# Patient Record
Sex: Female | Born: 1975 | Race: Black or African American | Hispanic: No | Marital: Married | Smoking: Former smoker
Health system: Southern US, Community
[De-identification: ages and names within clinical notes are randomized; demographics above are authoritative.]

## PROBLEM LIST (undated history)

## (undated) DIAGNOSIS — R112 Nausea with vomiting, unspecified: Secondary | ICD-10-CM

## (undated) DIAGNOSIS — Z8719 Personal history of other diseases of the digestive system: Secondary | ICD-10-CM

## (undated) DIAGNOSIS — R0600 Dyspnea, unspecified: Secondary | ICD-10-CM

## (undated) DIAGNOSIS — E119 Type 2 diabetes mellitus without complications: Secondary | ICD-10-CM

## (undated) DIAGNOSIS — N189 Chronic kidney disease, unspecified: Secondary | ICD-10-CM

## (undated) DIAGNOSIS — Z9889 Other specified postprocedural states: Secondary | ICD-10-CM

## (undated) DIAGNOSIS — T8859XA Other complications of anesthesia, initial encounter: Secondary | ICD-10-CM

## (undated) DIAGNOSIS — K219 Gastro-esophageal reflux disease without esophagitis: Secondary | ICD-10-CM

## (undated) DIAGNOSIS — D649 Anemia, unspecified: Secondary | ICD-10-CM

## (undated) DIAGNOSIS — F419 Anxiety disorder, unspecified: Secondary | ICD-10-CM

## (undated) DIAGNOSIS — I499 Cardiac arrhythmia, unspecified: Secondary | ICD-10-CM

## (undated) DIAGNOSIS — I10 Essential (primary) hypertension: Secondary | ICD-10-CM

## (undated) HISTORY — PX: NO PAST SURGERIES: SHX2092

---

## 2003-04-15 ENCOUNTER — Emergency Department (HOSPITAL_COMMUNITY): Admission: EM | Admit: 2003-04-15 | Discharge: 2003-04-15 | Payer: Self-pay | Admitting: *Deleted

## 2016-01-08 ENCOUNTER — Encounter (HOSPITAL_COMMUNITY): Payer: Self-pay

## 2016-01-08 ENCOUNTER — Ambulatory Visit (HOSPITAL_COMMUNITY): Admission: RE | Admit: 2016-01-08 | Payer: Medicaid Other | Source: Ambulatory Visit

## 2016-01-15 ENCOUNTER — Ambulatory Visit (HOSPITAL_COMMUNITY)
Admission: RE | Admit: 2016-01-15 | Discharge: 2016-01-15 | Disposition: A | Discharge status: Home / Self Care | Payer: Medicaid Other | Source: Ambulatory Visit | Attending: Unknown Physician Specialty | Admitting: Unknown Physician Specialty

## 2016-01-15 ENCOUNTER — Encounter: Payer: Medicaid Other | Attending: Obstetrics and Gynecology | Admitting: *Deleted

## 2016-01-15 DIAGNOSIS — Z029 Encounter for administrative examinations, unspecified: Secondary | ICD-10-CM | POA: Diagnosis not present

## 2016-01-15 DIAGNOSIS — O24111 Pre-existing diabetes mellitus, type 2, in pregnancy, first trimester: Secondary | ICD-10-CM

## 2016-01-15 DIAGNOSIS — O09511 Supervision of elderly primigravida, first trimester: Secondary | ICD-10-CM

## 2016-01-15 NOTE — Progress Notes (Signed)
  Patient was seen on 01/15/2016 for Type 2 Diabetes with new pregnancy education. Patient states she was diagnosed with DM 2 in 2010. She started on Metformin in 2014 but she states she had a lapse in her insurance coverage and was not able to follow up with her MD for the last couple of years. She found out she was pregnant about a month ago and is now back on her Metformin and is testing her BG 2- 4 times a day as able. She states understanding of the target BG ranges for pregnancy but states her FBG have been running 179-200 mg/dl and 1-2 hour post meal BG from 200-220 mg/dl. Her MD prescribed Levemir and she took her first injection today without difficulty. The following learning objectives were met by the patient during this visit:   States the definition of Type 2 Diabetes with pregnancy  States why dietary management is important in controlling blood glucose  Describes the effects each nutrient has on blood glucose levels  Demonstrates ability to create a balanced meal plan  Demonstrates carbohydrate counting   States when to check blood glucose levels  Demonstrates proper blood glucose monitoring techniques  States the effect of stress and exercise on blood glucose levels  States the importance of limiting caffeine and abstaining from alcohol and smoking  Rationale for taking Levemir at consistent time each day. Patient plans to take it at her earlier bedtime of about 10 PM daily  Patient states she already has a meter and is comfortable with how to use it  Patient instructed to monitor glucose levels: FBS: 60 - <90 1 hour: <140 2 hour: <120  *Patient received handouts:  Nutrition Diabetes and Pregnancy  Carbohydrate Counting List  Insulin injection sites and rotation plan  I explained rotation of injection sites, techniques for checking her BG and action of Levemir.  Patient will be seen for follow-up as needed.

## 2016-01-16 ENCOUNTER — Encounter (HOSPITAL_COMMUNITY): Payer: Self-pay

## 2016-01-16 DIAGNOSIS — O09519 Supervision of elderly primigravida, unspecified trimester: Secondary | ICD-10-CM | POA: Insufficient documentation

## 2016-01-16 NOTE — Progress Notes (Signed)
Genetic Counseling  High-Risk Gestation Note  Appointment Date:  01/15/16 Referred By: Melissa Dick, MD Date of Birth:  1975/11/30   Pregnancy History: G1P0 Estimated Date of Delivery: 07/23/16 Estimated Gestational Age: [redacted]w[redacted]d Attending: Eulis Foster, MD   [name] was seen for genetic counseling because of a maternal age of 40 y.o..   She will be 40 years old at delivery. delivery.   In summary:  Discussed AMA and associated risk for fetal aneuploidy  Discussed options for screening  First screen-patient indicated NT ultrasound was performed at [facility] office  Quad screen-declined at this time given that NIPS was drawn yesterday  NIPS-patient had Panorama drawn in OB office yesterday (01/14/16) and results are currently pending  Ultrasound-available in second trimester  Discussed diagnostic testing options  [facility]-declined  Amniocentesis-declined  Reviewed family history concerns  Patient has personal history of diabetes mellitus and hypertension; diabetic education appointment today  MFM consultation scheduled 01/20/16  Discussed screening options for fetal anatomy including detailed ultrasound and fetal echocardiogram   Discussed carrier screening options  CF-declined  SMA-declined  Hemoglobinopathies-declined  She was counseled regarding maternal age and the association with risk for chromosome conditions due to nondisjunction with aging of the ova.  We reviewed chromosomes, nondisjunction, and the associated 1 in 1 risk for fetal aneuploidy related to a maternal age of 40 y.o. at [redacted]w[redacted]d at [redacted]w[redacted]d gestation.  She was counseled that the risk for aneuploidy decreases as gestational age increases, accounting for those pregnancies which spontaneously abort.  We specifically discussed Down syndrome (trisomy 31), trisomies 28 and 55, and sex chromosome aneuploidies (47,XXX and 47,XXY) including the common features and prognoses of each.   We reviewed available screening options  including First Screen, Quad screen, noninvasive prenatal screening (NIPS)/cell free DNA (cfDNA) screening, and detailed ultrasound.  She was counseled that screening tests are used to modify a patient's a priori risk for aneuploidy, typically based on age. This estimate provides a pregnancy specific risk assessment. We reviewed the benefits and limitations of each option. Specifically, we discussed the conditions for which each test screens, the detection rates, and false positive rates of each. She was also counseled regarding diagnostic testing via [facility]S and amniocentesis. We reviewed the approximate 1 in 300-500 risk for complications from amniocentesis, including spontaneous pregnancy loss. We discussed the possible results that the tests might provide including: positive, negative, unanticipated, and no result. Finally, they were counseled regarding the cost of each option and potential out of pocket expenses. [name] had NIPS (Panorama) drawn yesterday (01/14/16) at her OB office; results currently pending. She declined diagnostic, invasive testing. She also indicated that if NIPS happens to have no result, she is unsure if she would pursue second blood draw.   We discussed that a detailed ultrasound would be available at ~18+ weeks gestation.  She understands that screening tests cannot rule out all birth defects or genetic syndromes. The patient was advised of this limitation and states she still does not want additional testing at this time.    Melissa [name] was provided with written information regarding cystic fibrosis (CF), spinal muscular atrophy (SMA) and hemoglobinopathies including the carrier frequency, availability of carrier screening and prenatal diagnosis if indicated.  In addition, we discussed that CF and hemoglobinopathies are routinely screened for as part of the Melissa [name] newborn screening panel.  After further discussion, she declined screening for CF, SMA and hemoglobinopathies.   Both  family histories were reviewed and found to be contributory for epilepsy for  the father of the pregnancy and for one of his sons with a partner other than the patient. The underlying etiology is not known. Epilepsy occurs in approximately 1% of the population and can have many causes.  Approximately 80% of epilepsy is thought to be idiopathic while the remaining 20% is secondary to a variety of factors such as perinatal events, infections, trauma and genetic disease.  A specific diagnosis in an affected individual is necessary to accurately assess the risk for other family members to develop epilepsy.  In the absence of a known etiology, epilepsy is thought to be caused by a combination of genetic and environmental factors, called multifactorial inheritance. Recurrence risk for epilepsy is estimated to be 4% for offspring of an individual with primary idiopathic epilepsy. She understands that prenatal screening and diagnosis is not available in the case of an unknown etiology for the epilepsy in the family. It would be important for the couple's pediatrician to be aware of this history so that their child(ren) can be screened and followed appropriately.   Melissa [name] does not have much information regarding the father of the pregnancy's family history. Without further information regarding the provided family history, an accurate genetic risk cannot be calculated. Further genetic counseling is warranted if more information is obtained.  Melissa [name] denied exposure to environmental toxins or chemical agents. She denied the use of alcohol, tobacco or street drugs. She denied significant viral illnesses during the course of her pregnancy. Her medical and surgical histories were contributory for diabetes mellitus and hypertension. She is currently treated with medication for these conditions. We discussed the fact that women who have insulin dependent diabetes are at an increased risk to have a baby with a birth defect.   The increase in risk correlates with the level of blood sugar control during the pregnancy, particularly during organogenesis.  The increase in risk is for any type of birth defect but is greatest for heart, limb, and neural tube defects.  The risk could be as high as 6-10% for individuals whose blood sugars are not well-controlled, but lower for women who have good blood sugar control throughout pregnancy. Second trimester detailed ultrasound and fetal echocardiogram are available to assess fetal anatomy in detail. The patient understands ultrasound cannot diagnose or rule out all birth defects or genetic conditions. MFM consultation is scheduled for 01/20/16.   I counseled Melissa [name] regarding the above risks and available options.  The approximate face-to-face time with the genetic counselor was 45 minutes.  Quinn PlowmanKaren Mccartney Chuba, MS,  Certified Genetic Counselor 01/16/2016

## 2016-01-20 ENCOUNTER — Encounter (HOSPITAL_COMMUNITY): Payer: Self-pay

## 2016-01-20 ENCOUNTER — Ambulatory Visit (HOSPITAL_COMMUNITY)
Admission: RE | Admit: 2016-01-20 | Discharge: 2016-01-20 | Disposition: A | Discharge status: Home / Self Care | Payer: Medicaid Other | Source: Ambulatory Visit | Attending: Unknown Physician Specialty | Admitting: Unknown Physician Specialty

## 2016-01-20 VITALS — BP 151/92 | HR 94 | Wt 294.0 lb

## 2016-01-20 DIAGNOSIS — O09519 Supervision of elderly primigravida, unspecified trimester: Secondary | ICD-10-CM | POA: Insufficient documentation

## 2016-01-20 DIAGNOSIS — Z87891 Personal history of nicotine dependence: Secondary | ICD-10-CM | POA: Insufficient documentation

## 2016-01-20 DIAGNOSIS — Z3A13 13 weeks gestation of pregnancy: Secondary | ICD-10-CM | POA: Insufficient documentation

## 2016-01-20 DIAGNOSIS — E119 Type 2 diabetes mellitus without complications: Secondary | ICD-10-CM | POA: Diagnosis not present

## 2016-01-20 DIAGNOSIS — O09512 Supervision of elderly primigravida, second trimester: Secondary | ICD-10-CM

## 2016-01-20 DIAGNOSIS — O09511 Supervision of elderly primigravida, first trimester: Secondary | ICD-10-CM | POA: Insufficient documentation

## 2016-01-20 DIAGNOSIS — O10011 Pre-existing essential hypertension complicating pregnancy, first trimester: Secondary | ICD-10-CM | POA: Insufficient documentation

## 2016-01-20 DIAGNOSIS — O24111 Pre-existing diabetes mellitus, type 2, in pregnancy, first trimester: Secondary | ICD-10-CM | POA: Insufficient documentation

## 2016-01-20 DIAGNOSIS — O24919 Unspecified diabetes mellitus in pregnancy, unspecified trimester: Secondary | ICD-10-CM | POA: Insufficient documentation

## 2016-01-20 DIAGNOSIS — O24912 Unspecified diabetes mellitus in pregnancy, second trimester: Secondary | ICD-10-CM

## 2016-01-20 HISTORY — DX: Type 2 diabetes mellitus without complications: E11.9

## 2016-01-20 HISTORY — DX: Essential (primary) hypertension: I10

## 2016-01-20 NOTE — Progress Notes (Signed)
MFM Consult Note  40 year old, G1, at 13 weeks 4 days gestation with an EDD of 07/23/2016 seen today in consultation for management options for diabetes.  Past Medical History:  Diagnosis Date  . Diabetes mellitus without complication ([place])   . Hypertension    Past Surgical History:  Procedure Laterality Date  . NO PAST SURGERIES     OB History    Gravida Para Term Preterm AB Living   1             SAB TAB Ectopic Multiple Live Births                 History reviewed. No pertinent family history.  Social History   Social History  . Marital status: Married    Spouse name: N/A  . Number of children: N/A  . Years of education: N/A   Occupational History  . Not on file.   Social History Main Topics  . Smoking status: Former Smoker    Types: Cigarettes  . Smokeless tobacco: Former Systems developer  . Alcohol use No  . Drug use: No  . Sexual activity: No   Other Topics Concern  . Not on file   Social History Narrative  . No narrative on file   No Known Allergies    Impression / Recommendations #1 Pregestational diabetes - She has met with the diabetic educator and is working and struggling to follow the diet. She was suggesting today cutting out her carbs entirely to get her blood glucoses to come into better control. I reinforced that she should follow her meal plan completely and that the way to control her blood sugars would be through the use of insulin.  - She was first diagnosed with diabetes in 2010 and was not taking medications as had no insurance. She had a HgbA1c of 10.6 on the 19th of June 2017 and a TSH of 1.31. She has not had an ophthalmologic examination recently and should be scheduled for this. Given the high HgbA1c and significant proteinuria, would consider also getting and EKG.  - She just started taking 30 units of Levemer in the morning and then switched to the evening. She is also on Metformin 145m orally BID. Her fasting since starting the insulin have  fasting in the 160-189 range, 2 hr B 178-203, 2 hr L 161-247 and 2 hr D 186-214. She is not checking all the appropriate times.  - Fetal anatomic survey should be evaluated at ~18-[redacted] weeks gestation given her elevated HgbA1c and increased risk of birth defects - Pediatric echo should be evaluated at ~24-[redacted] weeks gestation or sooner if clinically indicated - Serial ultrasounds for evaluation of fetal growth starting ~[redacted] weeks gestation - Daily maternal assessment of fetal movement starting ~[redacted] weeks gestation with immediate provider contact for decrease or cessation of fetal movement - Antenatal assessment starting ~32-34 weeks based on glucose, blood pressure control. Sooner as clinically indicated.  - Given chronic hypertension, would consider delivery at ~[redacted] weeks gestation or sooner as clinically indicated. #2 Chronic hypertension with significant proteinuria - She had 3.3 gms of protein on her 24-hour urine.  - She is currently on medication for therapy and should adjust medications to keep her blood pressures <160/105 - We discussed the RBA of baby ASA therapy and she will start 81 mg orally of aspirin daily through the remainder of the pregnancy #3 Advanced maternal age - She has seen our gDietitian See prior note and declined invasive testing.  Her NIPS evaluation is pending at this point.   Questions appear answered to her satisfaction. Precautions for the above given. Spent greater than 1/2 of a 50 minute visit face to face counseling.

## 2016-01-23 ENCOUNTER — Other Ambulatory Visit (HOSPITAL_COMMUNITY): Payer: Self-pay

## 2016-01-23 ENCOUNTER — Encounter (HOSPITAL_COMMUNITY): Payer: Medicaid Other

## 2016-02-09 ENCOUNTER — Other Ambulatory Visit (HOSPITAL_COMMUNITY): Payer: Self-pay

## 2016-02-24 ENCOUNTER — Other Ambulatory Visit (HOSPITAL_COMMUNITY): Payer: Self-pay | Admitting: Unknown Physician Specialty

## 2016-02-24 DIAGNOSIS — Z3A19 19 weeks gestation of pregnancy: Secondary | ICD-10-CM

## 2016-02-24 DIAGNOSIS — Z3689 Encounter for other specified antenatal screening: Secondary | ICD-10-CM

## 2016-02-24 DIAGNOSIS — O24912 Unspecified diabetes mellitus in pregnancy, second trimester: Secondary | ICD-10-CM

## 2016-02-27 ENCOUNTER — Ambulatory Visit (HOSPITAL_COMMUNITY): Payer: Medicaid Other

## 2016-03-02 ENCOUNTER — Encounter (HOSPITAL_COMMUNITY): Payer: Self-pay

## 2016-03-02 ENCOUNTER — Ambulatory Visit (HOSPITAL_COMMUNITY)
Admission: RE | Admit: 2016-03-02 | Discharge: 2016-03-02 | Disposition: A | Discharge status: Home / Self Care | Payer: Medicaid Other | Source: Ambulatory Visit | Attending: Unknown Physician Specialty | Admitting: Unknown Physician Specialty

## 2016-03-02 DIAGNOSIS — Z3689 Encounter for other specified antenatal screening: Secondary | ICD-10-CM

## 2016-03-02 DIAGNOSIS — Z36 Encounter for antenatal screening of mother: Secondary | ICD-10-CM | POA: Insufficient documentation

## 2016-03-02 DIAGNOSIS — O24912 Unspecified diabetes mellitus in pregnancy, second trimester: Secondary | ICD-10-CM | POA: Insufficient documentation

## 2016-03-02 DIAGNOSIS — Z3A19 19 weeks gestation of pregnancy: Secondary | ICD-10-CM | POA: Diagnosis not present

## 2016-03-03 ENCOUNTER — Encounter (HOSPITAL_COMMUNITY): Payer: Self-pay

## 2016-03-04 ENCOUNTER — Other Ambulatory Visit (HOSPITAL_COMMUNITY): Payer: Self-pay

## 2016-03-30 ENCOUNTER — Ambulatory Visit (HOSPITAL_COMMUNITY)
Admission: RE | Admit: 2016-03-30 | Discharge: 2016-03-30 | Disposition: A | Discharge status: Home / Self Care | Payer: Medicaid Other | Source: Ambulatory Visit | Attending: Unknown Physician Specialty | Admitting: Unknown Physician Specialty

## 2016-03-30 ENCOUNTER — Encounter (HOSPITAL_COMMUNITY): Payer: Self-pay

## 2016-03-30 VITALS — BP 154/87 | HR 89 | Wt 312.2 lb

## 2016-03-30 DIAGNOSIS — Z7982 Long term (current) use of aspirin: Secondary | ICD-10-CM | POA: Insufficient documentation

## 2016-03-30 DIAGNOSIS — O10012 Pre-existing essential hypertension complicating pregnancy, second trimester: Secondary | ICD-10-CM | POA: Diagnosis not present

## 2016-03-30 DIAGNOSIS — Z794 Long term (current) use of insulin: Secondary | ICD-10-CM | POA: Insufficient documentation

## 2016-03-30 DIAGNOSIS — Z3A23 23 weeks gestation of pregnancy: Secondary | ICD-10-CM | POA: Diagnosis not present

## 2016-03-30 DIAGNOSIS — R7301 Impaired fasting glucose: Secondary | ICD-10-CM

## 2016-03-30 DIAGNOSIS — O09512 Supervision of elderly primigravida, second trimester: Secondary | ICD-10-CM | POA: Insufficient documentation

## 2016-03-30 DIAGNOSIS — E1165 Type 2 diabetes mellitus with hyperglycemia: Secondary | ICD-10-CM | POA: Diagnosis not present

## 2016-03-30 DIAGNOSIS — O10919 Unspecified pre-existing hypertension complicating pregnancy, unspecified trimester: Secondary | ICD-10-CM

## 2016-03-30 DIAGNOSIS — O24112 Pre-existing diabetes mellitus, type 2, in pregnancy, second trimester: Secondary | ICD-10-CM | POA: Insufficient documentation

## 2016-03-30 DIAGNOSIS — O24312 Unspecified pre-existing diabetes mellitus in pregnancy, second trimester: Secondary | ICD-10-CM

## 2016-03-30 HISTORY — DX: Personal history of other diseases of the digestive system: Z87.19

## 2016-03-30 NOTE — Progress Notes (Signed)
Melissa [name] is a 40 y.o. female patient.  1. [redacted] weeks gestation of pregnancy   2. Preexisting diabetes complicating pregnancy in second trimester, antepartum   3. Advanced maternal age, 1st pregnancy, second trimester   4. Chronic hypertension affecting pregnancy   5. Fasting hyperglycemia    She has recently had her Levemir increased to 52 units at bedtime. She remains on Novolog at mealtimes, 06/07/11 units. Her blood sugars continue to show fasting hyperglycemia, but much improved over previous values. Her 2 hour postprandials are not perfect, but much improved and very close to euglycemia, especially at breakfast and lunch. She has recently been started on Labetalol 100mg  BID in addition to her Nifedipine 60mg  XL daily  Past Medical History:  Diagnosis Date  . Diabetes mellitus without complication ([facility])   . History of hiatal hernia   . Hypertension     Current Outpatient Prescriptions  Medication Sig Dispense Refill  . aspirin EC 81 MG tablet Take 81 mg by mouth daily.    . insulin aspart (NOVOLOG) 100 UNIT/ML injection Inject into the skin 3 (three) times daily before meals.    . insulin detemir (LEVEMIR) 100 UNIT/ML injection Inject 48 Units into the skin daily.     Marland Kitchen LABETALOL HCL PO Take by mouth.    . metFORMIN (GLUCOPHAGE) 1000 MG tablet Take 1,000 mg by mouth 2 (two) times daily with a meal.    . NIFEdipine (PROCARDIA XL/ADALAT-CC) 60 MG 24 hr tablet Take 60 mg by mouth daily.    . Prenatal Vit-Fe Fumarate-FA (PRENATAL VITAMIN PO) Take by mouth.     No current facility-administered medications for this encounter.    No Known Allergies Active Problems:   * No active hospital problems. *  Blood pressure (!) 154/87, pulse 89, weight (!) 312 lb 3.2 oz (141.6 kg), last menstrual period 10/17/2015.  ROS: The patient had a limited review of systems of the neurologic, abdominal and genitourinary system. Pertinent negatives include no headache, nausea, vomiting, RUQ pain,  epigastric pain, pelvic pressure, vaginal bleeding, loss of fluid per vagina, changes in vaginal discharge or contractions.   Physical Exam: A limited physical exam was performed. See above for vital signs. She is a well nourished obese female in no apparent distress. HEENT is normocephalic and atraumatic. Abdomen is gravid. Extremities are without edema.  Impression: IUP at 23+4 weeks, preexisting diabetes in improving control with some fasting hyperglycemia, chronic hypertension, and advanced maternal age.  Recommendations: Diabetes: I would recommend increasing evening meal Novolog to 14 units, and bedtime Levemir to 54 units. She recently had a 24 hour urine done, but I am unable to access the result. Her baseline proteinura was high, >3g in the first trimester. If it has not been done recently, I would recommend a spot-check of the patient's creatinine just to reassure ourselves that she is not having any deterioration of renal function.  Hypertension: the patient is very concerned about her risk for superimposed preeclampsia. She has noted that you have warned her that her risk for superimposed preeclampsia is very high. I went over with her the pathophysiology of PIH and the risk factors that apply in her case. Unfortunately you are correct that she is likely to devlop it. I encouraged her to continue her ASA and I went over theprecautions that we would take to optimize outcomes in case of the need for a preterm delivery.  Obstetrical regimen: At your discretion, I would like to see her back for a repeat  evaluation here in MFM in the early third trimester, 6-8 weeks from now. She should continue US evaluation in your office as you are already doing. Given her clinical situation, I might give consideration to starting antepartum testing a bit earlier than usual. I would consider weekly BPP from 28-32 weeks and the either weekly BPP or twice-weekly NST from that point on. Timing of delivery will  depend on her clinical course. Given her need for multiple antihypertensive and hypoglycemic agents I would consider delivery prior to 39 weeks. We can give a more definite recommendation during the early third trimester.  Thank you for allowing me to participate in your patient's care  Greater than 50% of my face-to-face time in this 40 minute visit was spent in counseling the patient  Allegra LaiMark Glidwell Hue [name] 03/30/2016

## 2016-04-01 ENCOUNTER — Other Ambulatory Visit (HOSPITAL_COMMUNITY): Payer: Self-pay

## 2016-04-23 ENCOUNTER — Encounter (HOSPITAL_COMMUNITY): Payer: Self-pay

## 2016-04-23 ENCOUNTER — Other Ambulatory Visit (HOSPITAL_COMMUNITY): Payer: Self-pay

## 2016-04-23 ENCOUNTER — Ambulatory Visit (HOSPITAL_COMMUNITY)
Admission: RE | Admit: 2016-04-23 | Discharge: 2016-04-23 | Disposition: A | Discharge status: Home / Self Care | Payer: Medicaid Other | Source: Ambulatory Visit | Attending: Unknown Physician Specialty | Admitting: Unknown Physician Specialty

## 2016-04-23 VITALS — BP 139/86 | HR 86 | Wt 313.5 lb

## 2016-04-23 DIAGNOSIS — E119 Type 2 diabetes mellitus without complications: Secondary | ICD-10-CM | POA: Diagnosis not present

## 2016-04-23 DIAGNOSIS — O24112 Pre-existing diabetes mellitus, type 2, in pregnancy, second trimester: Secondary | ICD-10-CM | POA: Insufficient documentation

## 2016-04-23 DIAGNOSIS — O99212 Obesity complicating pregnancy, second trimester: Secondary | ICD-10-CM | POA: Diagnosis not present

## 2016-04-23 DIAGNOSIS — Z3A27 27 weeks gestation of pregnancy: Secondary | ICD-10-CM | POA: Diagnosis not present

## 2016-04-23 DIAGNOSIS — O10012 Pre-existing essential hypertension complicating pregnancy, second trimester: Secondary | ICD-10-CM | POA: Diagnosis not present

## 2016-04-23 DIAGNOSIS — E669 Obesity, unspecified: Secondary | ICD-10-CM | POA: Diagnosis not present

## 2016-04-23 NOTE — Progress Notes (Signed)
MFM Consult, Staff Note:  First, we discussed the maternal risks of pregnancy with underlying diabetes. With regard to the impact of pregnancy on maternal disease, I told her that patients with underlying vasculopathy, especially of the retina and kidneys, appear to fare much worse during pregnancy than women without underlying vascular disease. I told her that obstetric complications such as pre-eclampsia, sometimes severe and early-onset, are seen with increased frequency in women with insulin-dependent diabetes.  We discussed the second- and third-trimester risk of abnormal fetal growth, including both growth restriction related to placental insufficiency and macrosomia due to maternal-fetal hyperglycemia. We also recognized oligohydramnios, stillbirth, fetal distress, meconium, etc., as potential complications. I also went over the newborn metabolic derangements that can occur with maternal diabetes, including delayed pulmonary maturity and hypoglycemia.  We talked about the tightness of glycemic control that is recommended before and during pregnancy. I reviewed the usual goals of achieving a HgbA1c value 6% or below.  While she began pregnancy with HgbA1c >10%, her last value was 5.8% indicating that she has excellent control.  I detailed the blood sugar targets of having fasting values in the 70-95 mg/dl range, and postprandial values under 120 mg/dl at two hours. I emphasized the time and effort required to achieve tight control in pregnancy, including the meticulous fingerstick monitoring; close adherence to diet, medication, and an exercise program; and, compliance with obstetric visits and scheduled fetal testing.   Review of her glycemic measures since the recent increase in levimir to 60 u qhs indicates a trend toward euglycemia.  Her fasting today was 87.  Given that she was just changed 2 days ago, I did not make any changes.  However, I told her that if over the next week if >50% of her  fasting values were above 95mg /dL she could increase to levimir 64 u qhs.  Given that her postprandial sugars are all within target this week on Novolog 06/07/11, I did not make any changes.   I also reviewed hypertension as a cause of uteroplacental insufficiency, with increased risk of IUGR, oligohydramnios, and stillbirth. I told her that her hypertension also places her at increased risk for preeclampsia, describing the triad of increased blood pressure, proteinuria, and abnormal edema. Lastly, hypertension (severe range) increases the risk of placental abruption, especially in the setting of superimposed preeclampsia.  I reviewed the essential tenets in the most recent guidelines for management of hypertension in pregnancy in accordance with the American College of Obstetrics and Gynecology expert opinion. We talked about the medical treatment of hypertension in pregnancy. I outlined the different classes of medications, emphasizing that angiotensin enzyme inhibitors and angoitensin receptor blockers are contraindicated, and diuretics are relatively contraindicated. I told her that beta-blockers and calcium channel blockers are commonly used to treat hypertension in pregnancy, and that both are felt to be safe for use in pregnancy.   Her dose of labetalol 200mg  po bid is adequate in current maintainance of her blood pressure in pregnancy as evidenced by BP's of 139/86 today.   I outlined the usual plan of management for hypertension in pregnancy. She should have her blood pressure carefully followed, and her medications adjusted to keep her BP in the target range of around 130-159/70-109 mm/Hg; ie, HTN should not be treated (no medication adjustment) until 160/110 or greater measurements for blood pressures to be consistent with current ACOG/SMFM guidelines (ie, guidelines are 160/110 in absence of renal/cardiac disease during pregnancy).   I gave the following recommendations to your patient. She  should keep up the good work toward tighter control as she is now doing very well, aiming for the glycemic targets mentioned above. She should continue monthly ultrasounds for fetal growth (scheduled for her). She will also need a screening fetal echocardiogram, twice weekly non-stress tests beginning [redacted] weeks along with weekly AFI. I recommend HgbA1c surveillance every 4-6 weeks of gestation. Finally, assuming all goes well (ie, good glycemic control, AGA growth and reassuring testing), she should expect to be delivered by or shortly after 39 weeks, if spontaneous labor does not occur beforehand.   Impression: 1. SIUP at 3739w0d 2. Type II DM 3. CHTN 4. Obesity  Summary of Recommendations: 1.continue Levimir 60u qhs 2. Continue Novolog 06/07/11 3. q 2 week glycemic review with diabetic education/MD 4. Monthly interval growth (scheduled here, noting we can review glycemic log at that time and this has been scheduled accordingly)  5. Twice weekly NST and weekly AFI beginning 32 weeks. 6. Fetal echocardiogram (scheduled) 7. HgbA1c q4-6 weeks 8. q trimester urine culture to screen for asymptomatic bacteruria  9. 11-20 lbs weight gain over pregnancy (IOM guideline for obese women) 10. Continue labetalol 200mg  po bid.   11. Preeclamptic precautions. 12. Delivery at 38-39 weeks with excellent glycemic control and control of BP and reassuring maternal-fetal status; sooner if clinically indicated  13. Given obesity, I recommend anesthesia consultation with admission for delivery. 14. Given obesity, I recommend LMWH prophylaxis postoperatively while in the hospital until fully ambulatory and ready for discharge home if she should end up needing a cesarean delivery for any unforeseen obstetrical indications (noting she is at increased risk for labor dystocia and hence has an increased relative risk for ultimately meeting criteria for needing a cesarean delivery).  Time Spent: I spent in excess of 60  minutes in consultation with this patient to review records, evaluate her case, and provide her with an adequate discussion and education. More than 50% of this time was spent in direct face-to-face counseling.  It was a pleasure seeing your patient in the office today. Thank you for consultation. Please do not hesitate to contact our service for any further questions.   Thank you,  Louann SjogrenJeffrey Morgan Gaynelle Arabianenney   Denney, Louann SjogrenJeffrey Morgan, MD, MS, FACOG Assistant Professor Section of Maternal-Fetal Medicine [facility]

## 2016-04-26 ENCOUNTER — Other Ambulatory Visit (HOSPITAL_COMMUNITY): Payer: Self-pay | Admitting: Obstetrics and Gynecology

## 2016-04-26 DIAGNOSIS — Z3A37 37 weeks gestation of pregnancy: Secondary | ICD-10-CM

## 2016-04-26 DIAGNOSIS — O09523 Supervision of elderly multigravida, third trimester: Secondary | ICD-10-CM

## 2016-04-26 DIAGNOSIS — O10913 Unspecified pre-existing hypertension complicating pregnancy, third trimester: Secondary | ICD-10-CM

## 2016-04-26 DIAGNOSIS — O24313 Unspecified pre-existing diabetes mellitus in pregnancy, third trimester: Secondary | ICD-10-CM

## 2016-04-26 DIAGNOSIS — O10012 Pre-existing essential hypertension complicating pregnancy, second trimester: Secondary | ICD-10-CM

## 2016-04-26 DIAGNOSIS — O99213 Obesity complicating pregnancy, third trimester: Secondary | ICD-10-CM

## 2016-04-26 DIAGNOSIS — Z3A33 33 weeks gestation of pregnancy: Secondary | ICD-10-CM

## 2016-04-26 DIAGNOSIS — O24112 Pre-existing diabetes mellitus, type 2, in pregnancy, second trimester: Secondary | ICD-10-CM

## 2016-04-26 DIAGNOSIS — O09512 Supervision of elderly primigravida, second trimester: Secondary | ICD-10-CM

## 2016-04-26 DIAGNOSIS — O99212 Obesity complicating pregnancy, second trimester: Secondary | ICD-10-CM

## 2016-04-26 DIAGNOSIS — Z3A29 29 weeks gestation of pregnancy: Secondary | ICD-10-CM

## 2016-05-07 ENCOUNTER — Other Ambulatory Visit (HOSPITAL_COMMUNITY): Payer: Self-pay | Admitting: Obstetrics and Gynecology

## 2016-05-07 ENCOUNTER — Ambulatory Visit (HOSPITAL_COMMUNITY)
Admission: RE | Admit: 2016-05-07 | Discharge: 2016-05-07 | Disposition: A | Discharge status: Home / Self Care | Payer: Medicaid Other | Source: Ambulatory Visit | Attending: Unknown Physician Specialty | Admitting: Unknown Physician Specialty

## 2016-05-07 ENCOUNTER — Encounter (HOSPITAL_COMMUNITY): Payer: Self-pay

## 2016-05-07 DIAGNOSIS — O09513 Supervision of elderly primigravida, third trimester: Secondary | ICD-10-CM | POA: Insufficient documentation

## 2016-05-07 DIAGNOSIS — O163 Unspecified maternal hypertension, third trimester: Secondary | ICD-10-CM

## 2016-05-07 DIAGNOSIS — O99212 Obesity complicating pregnancy, second trimester: Secondary | ICD-10-CM

## 2016-05-07 DIAGNOSIS — O24113 Pre-existing diabetes mellitus, type 2, in pregnancy, third trimester: Secondary | ICD-10-CM | POA: Diagnosis not present

## 2016-05-07 DIAGNOSIS — Z3A29 29 weeks gestation of pregnancy: Secondary | ICD-10-CM | POA: Insufficient documentation

## 2016-05-07 DIAGNOSIS — O24913 Unspecified diabetes mellitus in pregnancy, third trimester: Secondary | ICD-10-CM

## 2016-05-07 DIAGNOSIS — O10012 Pre-existing essential hypertension complicating pregnancy, second trimester: Secondary | ICD-10-CM

## 2016-05-07 DIAGNOSIS — O10913 Unspecified pre-existing hypertension complicating pregnancy, third trimester: Secondary | ICD-10-CM

## 2016-05-07 DIAGNOSIS — O99213 Obesity complicating pregnancy, third trimester: Secondary | ICD-10-CM

## 2016-05-07 DIAGNOSIS — O09512 Supervision of elderly primigravida, second trimester: Secondary | ICD-10-CM

## 2016-05-07 DIAGNOSIS — O10013 Pre-existing essential hypertension complicating pregnancy, third trimester: Secondary | ICD-10-CM | POA: Insufficient documentation

## 2016-05-07 DIAGNOSIS — O24112 Pre-existing diabetes mellitus, type 2, in pregnancy, second trimester: Secondary | ICD-10-CM

## 2016-05-07 DIAGNOSIS — O24313 Unspecified pre-existing diabetes mellitus in pregnancy, third trimester: Secondary | ICD-10-CM

## 2016-05-07 NOTE — Progress Notes (Signed)
I saw Ms. Hett again today for a follow up visit. Her US had not been performed at the time of her visit. Her problems are as listed below:  1. [redacted] weeks gestation of pregnancy   2. Preexisting diabetes complicating pregnancy in second trimester, antepartum   3. Advanced maternal age, 1st pregnancy, second trimester   4. Chronic hypertension affecting pregnancy   5. Fasting hyperglycemia    She has recently had her Levemir increased to 62 units at bedtime. She remains on Novolog at mealtimes, 06/07/11 units. Her blood sugars are much improved. Her 2 hour postprandials are euglycemic, averaging only one abnormal value per week. Fastings are a bit less well-controlled, with 2 abnormal values per week, but none >110. She has recently had her Labetalol increased to 200mg  BID in addition to her Nifedipine 60mg  XL daily      Past Medical History:  Diagnosis Date  . Diabetes mellitus without complication (HCC)   . History of hiatal hernia   . Hypertension           Current Outpatient Prescriptions  Medication Sig Dispense Refill  . aspirin EC 81 MG tablet Take 81 mg by mouth daily.    . insulin aspart (NOVOLOG) 100 UNIT/ML injection Inject into the skin 3 (three) times daily before meals.    . insulin detemir (LEVEMIR) 100 UNIT/ML injection Inject 62 Units into the skin daily.     Marland Kitchen. LABETALOL HCL 200 MG PO Take by mouth BID    . metFORMIN (GLUCOPHAGE) 1000 MG tablet Take 1,000 mg by mouth 2 (two) times daily with a meal.    . NIFEdipine (PROCARDIA XL/ADALAT-CC) 60 MG 24 hr tablet Take 60 mg by mouth daily.    . Prenatal Vit-Fe Fumarate-FA (PRENATAL VITAMIN PO) Take by mouth.     No current facility-administered medications for this encounter.    No Known Allergies Active Problems:   * No active hospital problems. *  Blood pressure 169/90, pulse 89, weight  315.4 lb  ROS: The patient had a limited review of systems of the neurologic, abdominal and genitourinary  system. Pertinent negatives include no headache, nausea, vomiting, RUQ pain, epigastric pain, pelvic pressure, vaginal bleeding, loss of fluid per vagina, changes in vaginal discharge or contractions.   Physical Exam: A limited physical exam was performed. See above for vital signs. She is a well nourished obese female in no apparent distress. HEENT is normocephalic and atraumatic. Abdomen is gravid. Extremities are without edema.  Impression: IUP at 29+0 weeks, preexisting diabetes in good control with some fasting hyperglycemia, chronic hypertension, and advanced maternal age.  Recommendations: Diabetes: No changes in her medication regimen. As I mentioned previously,if it has not been done recently, I would recommend a spot-check of the patient's creatinine just to reassure ourselves that she is not having any deterioration of renal function.  Hypertension: No changes as of yet in her medication regimen. If her BP is elevated above the 150/90 level at her visit in your office in 6 days, I would repeat preeclamptic labs and increase her dose of labetalol if there is no overt sign of superimposed preeclampsia  Obstetrical regimen: At your discretion, I would like to see her back for a repeat evaluation here in MFM in 4 weeks. She should continue US evaluation in your office as you are already doing. We will do a BPP with her growth scan today, and I would start her antepartum fetal testing regimen next week.. Timing of delivery will depend  on her clinical course. Given her need for multiple antihypertensive and hypoglycemic agents I would consider delivery between 37 and 39 weeks, depending on the quality of her control. At this point I would plan for delivery at 38 weeks. That of course may change if clinical course deteriorates  Thank you for allowing me to participate in your patient's care  Greater than 50% of my face-to-face time in this 20 minute visit was spent in counseling the  patient  [employer] Glidwell Newman

## 2016-05-10 ENCOUNTER — Other Ambulatory Visit (HOSPITAL_COMMUNITY): Payer: Self-pay

## 2016-06-04 ENCOUNTER — Encounter (HOSPITAL_COMMUNITY): Payer: Self-pay

## 2016-06-04 ENCOUNTER — Ambulatory Visit (HOSPITAL_COMMUNITY)
Admission: RE | Admit: 2016-06-04 | Discharge: 2016-06-04 | Disposition: A | Discharge status: Home / Self Care | Payer: Medicaid Other | Source: Ambulatory Visit | Attending: Unknown Physician Specialty | Admitting: Unknown Physician Specialty

## 2016-06-04 DIAGNOSIS — O09513 Supervision of elderly primigravida, third trimester: Secondary | ICD-10-CM | POA: Insufficient documentation

## 2016-06-04 DIAGNOSIS — O99213 Obesity complicating pregnancy, third trimester: Secondary | ICD-10-CM | POA: Insufficient documentation

## 2016-06-04 DIAGNOSIS — E669 Obesity, unspecified: Secondary | ICD-10-CM | POA: Insufficient documentation

## 2016-06-04 DIAGNOSIS — Z3A31 31 weeks gestation of pregnancy: Secondary | ICD-10-CM | POA: Diagnosis not present

## 2016-06-04 DIAGNOSIS — O24313 Unspecified pre-existing diabetes mellitus in pregnancy, third trimester: Secondary | ICD-10-CM | POA: Diagnosis not present

## 2016-06-04 DIAGNOSIS — Z794 Long term (current) use of insulin: Secondary | ICD-10-CM | POA: Diagnosis not present

## 2016-06-04 DIAGNOSIS — E119 Type 2 diabetes mellitus without complications: Secondary | ICD-10-CM | POA: Insufficient documentation

## 2016-06-04 DIAGNOSIS — I10 Essential (primary) hypertension: Secondary | ICD-10-CM | POA: Insufficient documentation

## 2016-06-04 DIAGNOSIS — O10013 Pre-existing essential hypertension complicating pregnancy, third trimester: Secondary | ICD-10-CM | POA: Diagnosis not present

## 2016-06-04 DIAGNOSIS — Z87891 Personal history of nicotine dependence: Secondary | ICD-10-CM | POA: Diagnosis not present

## 2016-06-04 DIAGNOSIS — Z3A33 33 weeks gestation of pregnancy: Secondary | ICD-10-CM

## 2016-06-04 DIAGNOSIS — Z7982 Long term (current) use of aspirin: Secondary | ICD-10-CM | POA: Insufficient documentation

## 2016-06-04 DIAGNOSIS — O10913 Unspecified pre-existing hypertension complicating pregnancy, third trimester: Secondary | ICD-10-CM

## 2016-06-04 DIAGNOSIS — O09523 Supervision of elderly multigravida, third trimester: Secondary | ICD-10-CM

## 2016-06-04 NOTE — Progress Notes (Signed)
Maternal Fetal Medicine Note  Melissa [name]  Returns for follow up.  Her prenatal course has been complicated by:  1) Type 2 diabetes - initial HbA1C was 10.6; most recent 5.8; patient is currently on Levomir 65 and 06/07/11 of Humalog with meals 2) Obesity 3) Chronic hypertension on Procardia 60 mg XL and Labetalol 200 mg BID 4) Fetus with "ventricular hypertrophy" on fetal ech - recommend follow up echo after delivery   Melissa [name] is without complaints.  The fetus is active.  Levomir recently increased to 65 earlier this week.  Her blood sugars - fasting mostly in high 90's; all other post prandial values within target range.  OB History: OB History    Gravida Para Term Preterm AB Living   1         0   SAB TAB Ectopic Multiple Live Births                  PMH:  Past Medical History:  Diagnosis Date  . Diabetes mellitus without complication (HCC)   . History of hiatal hernia   . Hypertension     PSH:  Past Surgical History:  Procedure Laterality Date  . NO PAST SURGERIES     Meds:  Current Outpatient Prescriptions on File Prior to Encounter  Medication Sig Dispense Refill  . aspirin EC 81 MG tablet Take 81 mg by mouth daily.    . insulin aspart (NOVOLOG) 100 UNIT/ML injection Inject into the skin 3 (three) times daily before meals.    . insulin detemir (LEVEMIR) 100 UNIT/ML injection Inject 48 Units into the skin daily.     . IRON PO Take by mouth.    . LABETALOL HCL PO Take by mouth.    . metFORMIN (GLUCOPHAGE) 1000 MG tablet Take 1,000 mg by mouth 2 (two) times daily with a meal.    . NIFEdipine (PROCARDIA XL/ADALAT-CC) 60 MG 24 hr tablet Take 60 mg by mouth daily.    . Prenatal Vit-Fe Fumarate-FA (PRENATAL VITAMIN PO) Take by mouth.     No current facility-administered medications on file prior to encounter.    Allergies: No Known Allergies   Soc:  Social History   Social History  . Marital status: Married    Spouse name: N/A  . Number of children: N/A  .  Years of education: N/A   Occupational History  . Not on file.   Social History Main Topics  . Smoking status: Former Smoker    Types: Cigarettes  . Smokeless tobacco: Former NeurosurgeonUser  . Alcohol use No  . Drug use: No  . Sexual activity: No   Other Topics Concern  . Not on file   Social History Narrative  . No narrative on file    Review of Systems: no vaginal bleeding or cramping/contractions, no LOF, no nausea/vomiting. All other systems reviewed and are negative.   PE:  Wt: 316 lbs, BP 150/89, P 96  GEN: well-appearing female ABD: gravid, NT  Ultrasound: Single IUP at 33w 0d Type 2 diabetes, Obesity, CHTN Fetal growth is appropriate (32nd %tile) Active fetus Normal amniotic fluid volume  A/P: 1) Type 2 diabetes - appears to now be well-controlled on the patient's current dose of insulin and metformin.  No changes at this time.  [place] and is being [place] twice weekly.  2) CHTN - the patient reports that she just turned in a 24-hr urine protein collection and has not been  given the results.  Her blood pressures today are somewhat improved compared to her prior clinic visits. Would not make any changes to her medications today.  If she is noted to have worsening blood pressures or other symptoms, would have a low threshold to check preeclampsia labs  3) Ventricular hypertrophy on fetal echo - will need Pediatric echo after delivery  Recommendations: 1) [facility] [place] [facility] [place] [facility] 2) Patient is scheduled for follow up ultrasound in 4 weeks with MFM  3) Needs at least weekly visits for blood pressure checks and blood sugar reviews.  Would like to maintain blood pressures at or below 150/90's. 4) Given blood pressures / diabetic control, would recommend delivery between 37-[redacted] weeks gestation; clinical scenario may dictate earlier delivery   Thank you for the opportunity to be a part of the care of Melissa [name]. Please contact our office if we can be of further assistance.   I spent approximately 30 minutes with this patient with over 50% of time spent in face-to-face counseling.  Alpha GulaPaul Shanan Mcmiller, MD Maternal Fetal Medicine

## 2016-06-09 ENCOUNTER — Encounter (HOSPITAL_COMMUNITY): Payer: Self-pay

## 2016-07-02 ENCOUNTER — Ambulatory Visit (HOSPITAL_COMMUNITY)
Admission: RE | Admit: 2016-07-02 | Discharge: 2016-07-02 | Disposition: A | Discharge status: Home / Self Care | Payer: Medicaid Other | Source: Ambulatory Visit | Attending: Unknown Physician Specialty | Admitting: Unknown Physician Specialty

## 2016-07-02 ENCOUNTER — Encounter (HOSPITAL_COMMUNITY): Payer: Self-pay

## 2016-07-02 ENCOUNTER — Other Ambulatory Visit: Payer: Self-pay

## 2016-07-02 ENCOUNTER — Other Ambulatory Visit (HOSPITAL_COMMUNITY): Payer: Self-pay | Admitting: Obstetrics and Gynecology

## 2016-07-02 DIAGNOSIS — O09513 Supervision of elderly primigravida, third trimester: Secondary | ICD-10-CM | POA: Insufficient documentation

## 2016-07-02 DIAGNOSIS — O09523 Supervision of elderly multigravida, third trimester: Secondary | ICD-10-CM

## 2016-07-02 DIAGNOSIS — O10913 Unspecified pre-existing hypertension complicating pregnancy, third trimester: Secondary | ICD-10-CM

## 2016-07-02 DIAGNOSIS — O10013 Pre-existing essential hypertension complicating pregnancy, third trimester: Secondary | ICD-10-CM | POA: Diagnosis not present

## 2016-07-02 DIAGNOSIS — O99213 Obesity complicating pregnancy, third trimester: Secondary | ICD-10-CM

## 2016-07-02 DIAGNOSIS — O24313 Unspecified pre-existing diabetes mellitus in pregnancy, third trimester: Secondary | ICD-10-CM

## 2016-07-02 DIAGNOSIS — Z3A27 27 weeks gestation of pregnancy: Secondary | ICD-10-CM | POA: Diagnosis not present

## 2016-07-02 DIAGNOSIS — Z3A37 37 weeks gestation of pregnancy: Secondary | ICD-10-CM

## 2016-07-02 NOTE — Progress Notes (Signed)
MFM note  Melissa [name]  Returns for follow up.  Her prenatal course has been complicated by:  1) Type 2 diabetes - initial HbA1C was 10.6; most recent 5.8; patient is currently on Levomir 65 and 06/07/11 of Humalog with meals 2) Obesity 3) Chronic hypertension on Procardia 60 mg XL and Labetalol 200 mg BID 4) Fetus with "ventricular hypertrophy" on fetal echo - recommend follow up echo after delivery   Melissa [name] is without complaints.  Fingestick logs were reviewed - majority of values within target range.    Past Medical History:  Diagnosis Date  . Diabetes mellitus without complication (HCC)   . History of hiatal hernia   . Hypertension     Past Surgical History:  Procedure Laterality Date  . NO PAST SURGERIES     Current Outpatient Prescriptions on File Prior to Encounter  Medication Sig Dispense Refill  . aspirin EC 81 MG tablet Take 81 mg by mouth daily.    . insulin aspart (NOVOLOG) 100 UNIT/ML injection Inject into the skin 3 (three) times daily before meals.    . insulin detemir (LEVEMIR) 100 UNIT/ML injection Inject 48 Units into the skin daily.     . IRON PO Take by mouth.    . LABETALOL HCL PO Take by mouth.    . metFORMIN (GLUCOPHAGE) 1000 MG tablet Take 1,000 mg by mouth 2 (two) times daily with a meal.    . NIFEdipine (PROCARDIA XL/ADALAT-CC) 60 MG 24 hr tablet Take 60 mg by mouth daily.    . Prenatal Vit-Fe Fumarate-FA (PRENATAL VITAMIN PO) Take by mouth.     No current facility-administered medications on file prior to encounter.    VS: 322.4 lbs, 140/89, 93  WDWN female in NAD Abd- soft, NT, gravid   Ultrasound: Single IUP at 37w 0d Type 2 diabetes, CHTN, AMA Fetal growth is appropriate (44th %tile) BPP 8/8 Normal amniotic fluid volume  A/P:  1) Type 2 diabetes - appears to now be well-controlled on the patient's current dose of insulin and metformin.  No changes at this time.  Continue 2x weekly NSTS with weekly AFIs.             2) CHTN - the  patient reports that she had some recent preeclampsia labs that were within normal limits.  Her blood pressure today is stable - would not make any adjustments to her medications at this time.  Would like to maintain BPs at or below 150's/90's.             3) Ventricular hypertrophy on fetal echo - will need Pediatric echo after delivery  Recommendations: 1) Continue 2x weekly NSTs and weekly AFIs in [place] 2) Recommend delivery at 38 weeks given CHTN on two agents 3) Notify Pediatric team at time of delivery - will need echo of the newborn after delivery (due to ventricular hypertrophy)  Alpha GulaPaul Belita Warsame, MD Maternal Fetal Medicine

## 2016-11-19 ENCOUNTER — Encounter (HOSPITAL_COMMUNITY): Payer: Self-pay

## 2017-08-17 IMAGING — US US MFM FETAL BPP W/O NON-STRESS
1 series · 13 of 28 positions shown · non-contrast
Comparison: none

[Series 1: us mfm fetal bpp w/o non-stress · 58 acquisitions, 13 frames shown]
[im 3/58]
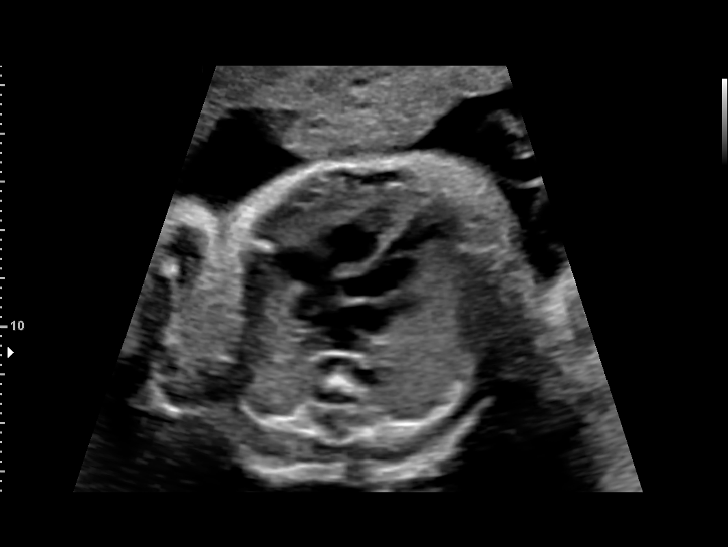
[im 7/58]
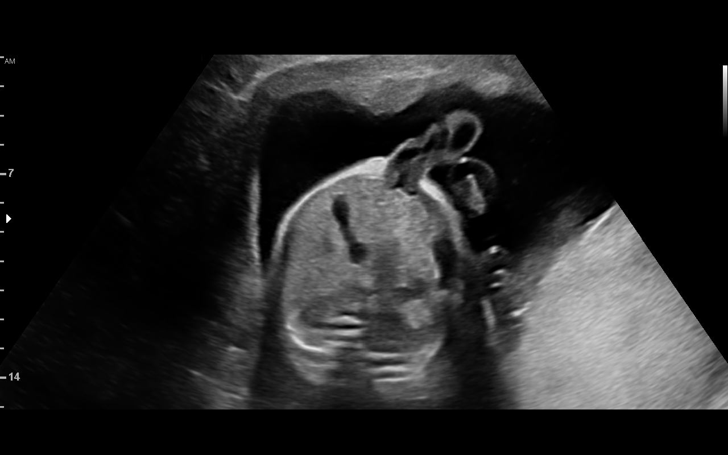
[im 11/58]
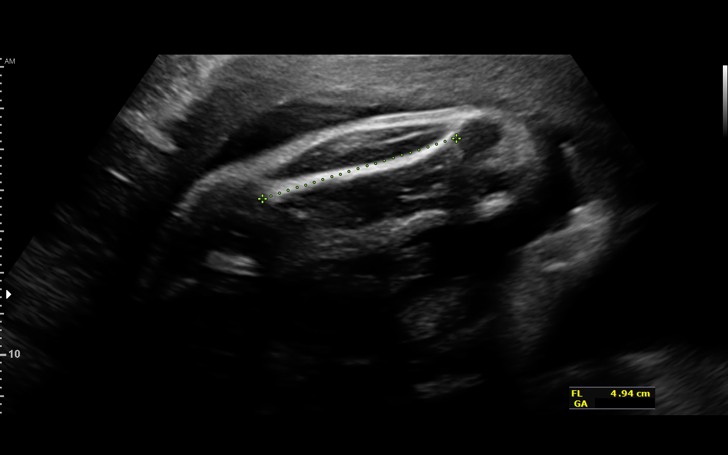
[im 15/58]
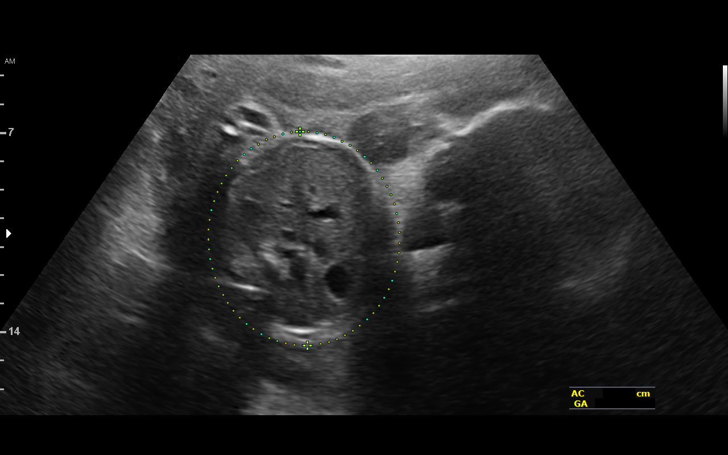
[im 20/58]
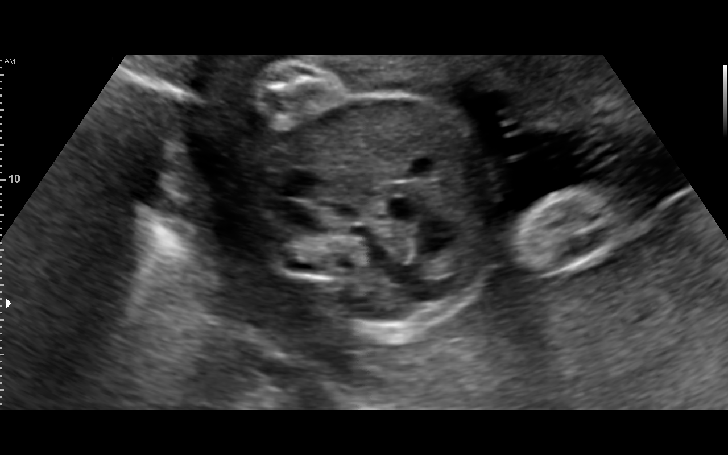
[im 24/58]
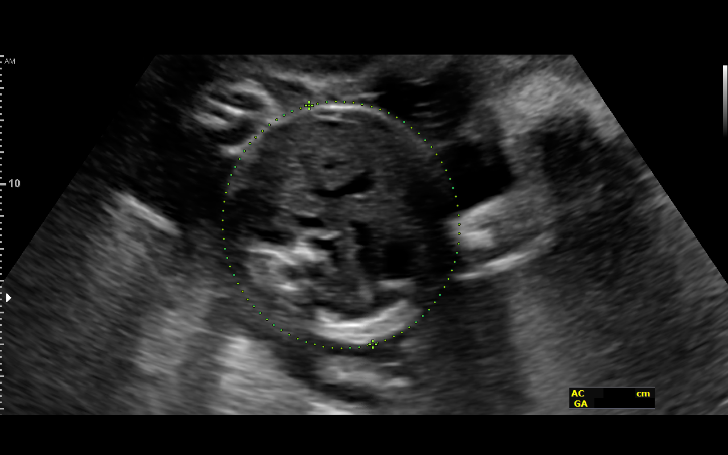
[im 30/58]
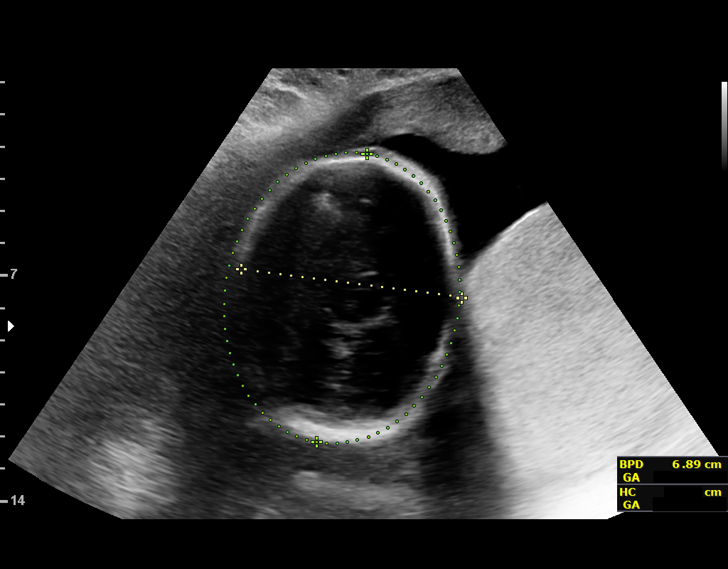
[im 34/58]
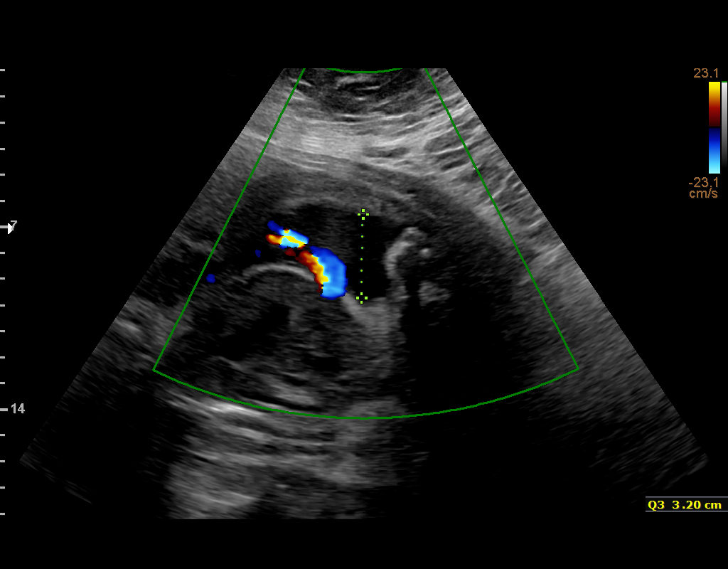
[im 39/58]
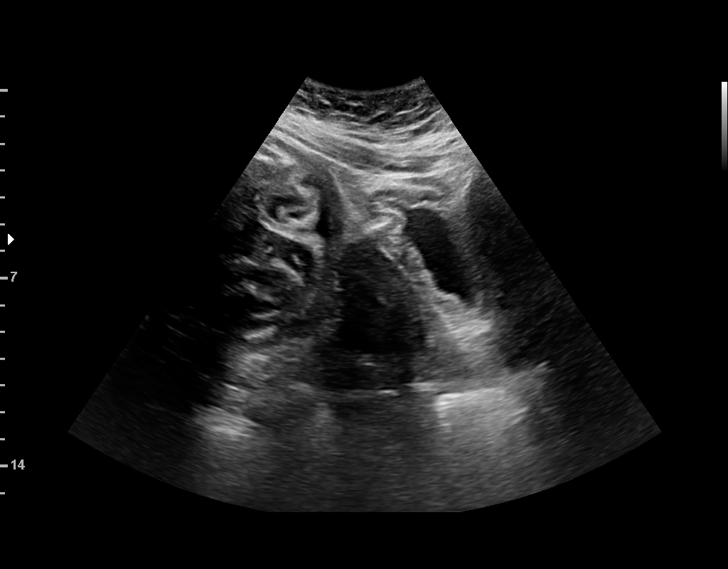
[im 43/58]
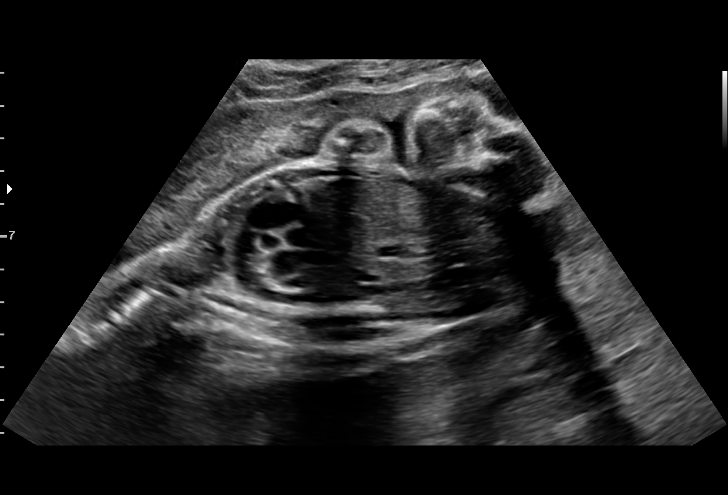
[im 47/58]
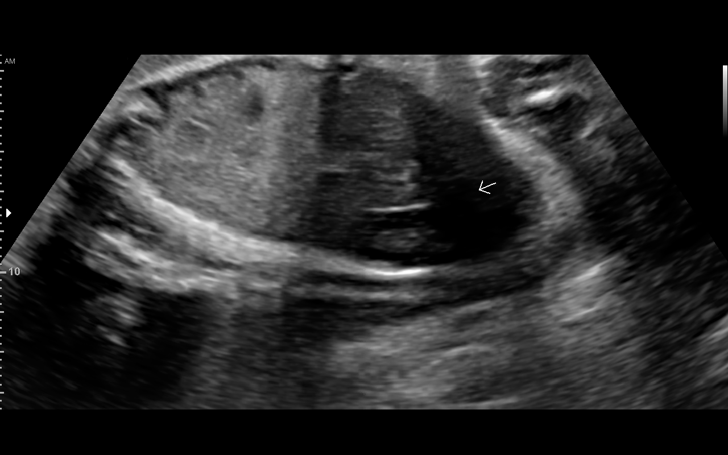
[im 51/58]
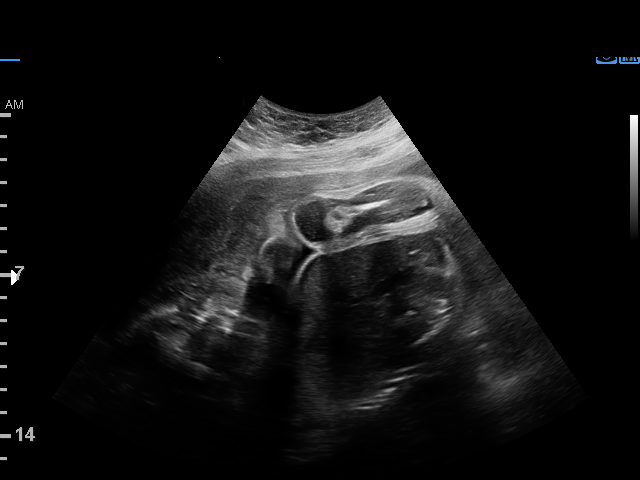
[im 55/58]
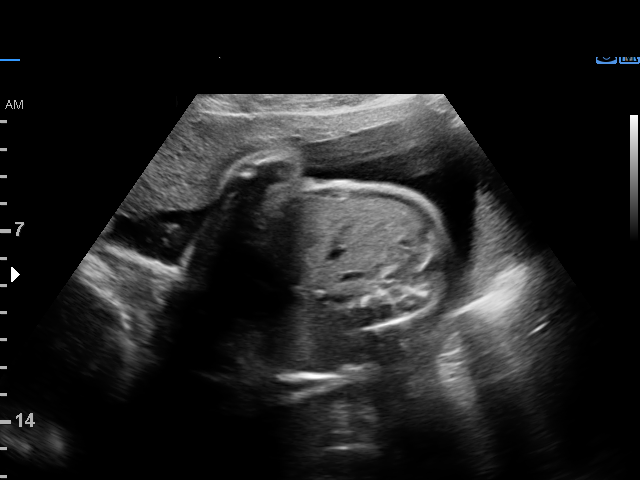

[13 of 28 positions shown; findings below may reference images not displayed]

pm)

Center
[REDACTED] [id number]

1  Kiri Jim              [id number]      [id number]     [id number]
2  RELINDAS AUAD              [id number]      [id number]     [id number]
Indications

29 weeks gestation of pregnancy
Advanced maternal age primigravida 35+,
third trimester
Obesity complicating pregnancy, third
trimester
Pre-existing essential hypertension
complicating pregnancy, third trimester
Pre-existing diabetes, type 2, in pregnancy,
third trimester
OB History

Gravidity:    1         Term:   0        Prem:   0        SAB:   0
TOP:          0       Ectopic:  0
Fetal Evaluation

Num Of Fetuses:     1
Fetal Heart         151
Rate(bpm):
Cardiac Activity:   Observed
Presentation:       Breech
Placenta:           Posterior, above cervical os
P. Cord Insertion:  Previously Visualized

Amniotic Fluid
AFI FV:      Subjectively within normal limits
AFI Sum(cm)     %Tile       Largest Pocket(cm)
17.54           66

RUQ(cm)       RLQ(cm)       LUQ(cm)        LLQ(cm)
5.34
Biophysical Evaluation

Amniotic F.V:   Pocket => 2 cm two         F. Tone:        Observed
planes
F. Movement:    Observed                   Score:          [DATE]
F. Breathing:   Observed
Biometry

BPD:      70.4  mm     G. Age:  28w 2d         17  %    CI:        70.86   %    70 - 86
FL/HC:      19.5   %    19.6 -
HC:      266.5  mm     G. Age:  29w 0d         20  %    HC/AC:      1.13        0.99 -
AC:      236.1  mm     G. Age:  27w 6d         16  %    FL/BPD:     74.0   %    71 - 87
FL:       52.1  mm     G. Age:  27w 5d          9  %    FL/AC:      22.1   %    20 - 24

Est. FW:    4471  gm      2 lb 9 oz     32  %
Gestational Age

LMP:           29w 0d        Date:  10/17/15                 EDD:   07/23/16
U/S Today:     28w 2d                                        EDD:   07/28/16
Best:          29w 0d     Det. By:  LMP  (10/17/15)          EDD:   07/23/16
Anatomy

Cranium:               Previously seen        Aortic Arch:            Not well visualized
Cavum:                 Previously seen        Ductal Arch:            Appears normal
Ventricles:            Previously seen        Diaphragm:              Appears normal
Choroid Plexus:        Previously seen        Stomach:                Appears normal, left
sided
Cerebellum:            Previously seen        Abdomen:                Appears normal
Posterior Fossa:       Previously seen        Abdominal Wall:         Appears nml (cord
insert, abd wall)
Nuchal Fold:           Previously seen        Cord Vessels:           Previously seen
Face:                  Orbits and profile     Kidneys:                Appear normal
previously seen
Lips:                  Appears normal         Bladder:                Appears normal
Thoracic:              Appears normal         Spine:                  Previously seen
Heart:                 Not well visualized    Upper Extremities:      Previously seen
RVOT:                  Appears normal         Lower Extremities:      Previously seen
LVOT:                  Appears normal

Other:  Fetus appears to be a male. Heels previously seen. Nasal bone
visualized. Technically difficult due to maternal habitus.
Cervix Uterus Adnexa

Cervix
Length:           4.78  cm.
Normal appearance by transabdominal scan.

Uterus
No abnormality visualized.
Left Ovary
Not visualized.

Right Ovary
Not visualized.
Impression

Singleton intrauterine pregnancy at 29+0 weeks, chronic
hypertension and type 2 diabetes
Review of the anatomy shows no sonographic markers for
aneuploidy or structural anomalies
Amniotic fluid volume is normal with an AFI of 17.5cm
BPP [DATE]
Estimated fetal weight is 1160g which is growth in the 32nd
percentile percentile
Recommendations

Recommend starting antepartum testing next weeks.
Recheck growth in 4 weeks

## 2019-01-27 DIAGNOSIS — I499 Cardiac arrhythmia, unspecified: Secondary | ICD-10-CM

## 2019-01-27 HISTORY — DX: Cardiac arrhythmia, unspecified: I49.9

## 2019-03-28 ENCOUNTER — Other Ambulatory Visit (HOSPITAL_COMMUNITY): Payer: Self-pay | Admitting: General Practice

## 2019-03-28 DIAGNOSIS — Z1231 Encounter for screening mammogram for malignant neoplasm of breast: Secondary | ICD-10-CM

## 2019-04-11 ENCOUNTER — Ambulatory Visit (HOSPITAL_COMMUNITY): Payer: Self-pay

## 2019-04-13 ENCOUNTER — Encounter (HOSPITAL_COMMUNITY): Payer: Self-pay

## 2019-04-13 ENCOUNTER — Ambulatory Visit (HOSPITAL_COMMUNITY)
Admission: RE | Admit: 2019-04-13 | Discharge: 2019-04-13 | Disposition: A | Discharge status: Home / Self Care | Payer: Self-pay | Source: Ambulatory Visit | Attending: General Practice | Admitting: General Practice

## 2019-04-13 ENCOUNTER — Other Ambulatory Visit: Payer: Self-pay

## 2019-04-13 DIAGNOSIS — Z1231 Encounter for screening mammogram for malignant neoplasm of breast: Secondary | ICD-10-CM | POA: Insufficient documentation

## 2020-07-23 IMAGING — MG DIGITAL SCREENING BILAT W/ TOMO W/ CAD
8 of 13 series · 8 of 37 positions shown · non-contrast
Comparison: None.

CLINICAL DATA: Screening.

EXAM:
DIGITAL SCREENING BILATERAL MAMMOGRAM WITH TOMO AND CAD

[R MLO]
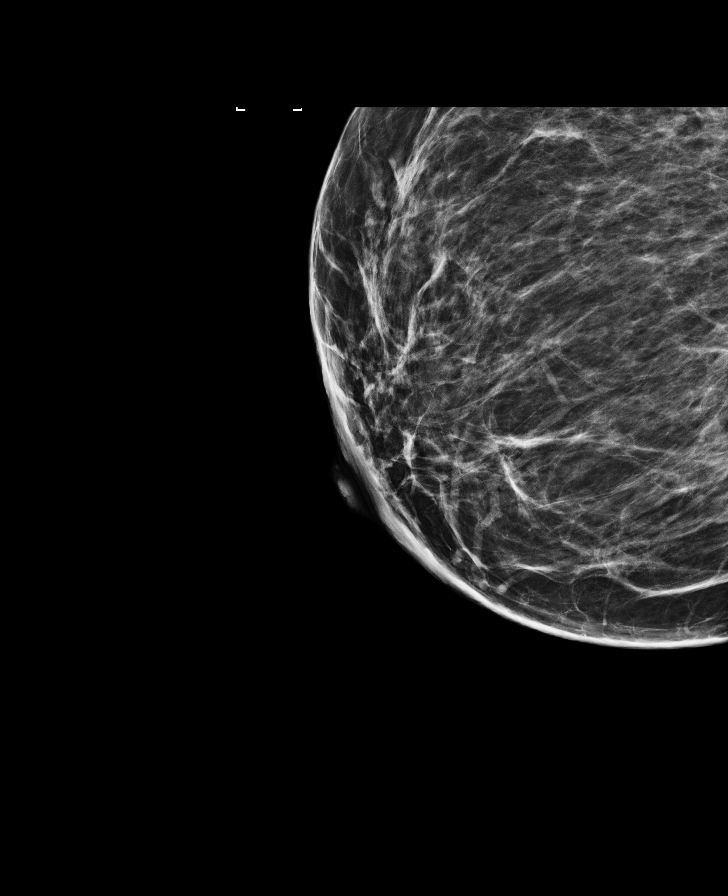

[R CC synth-2D]
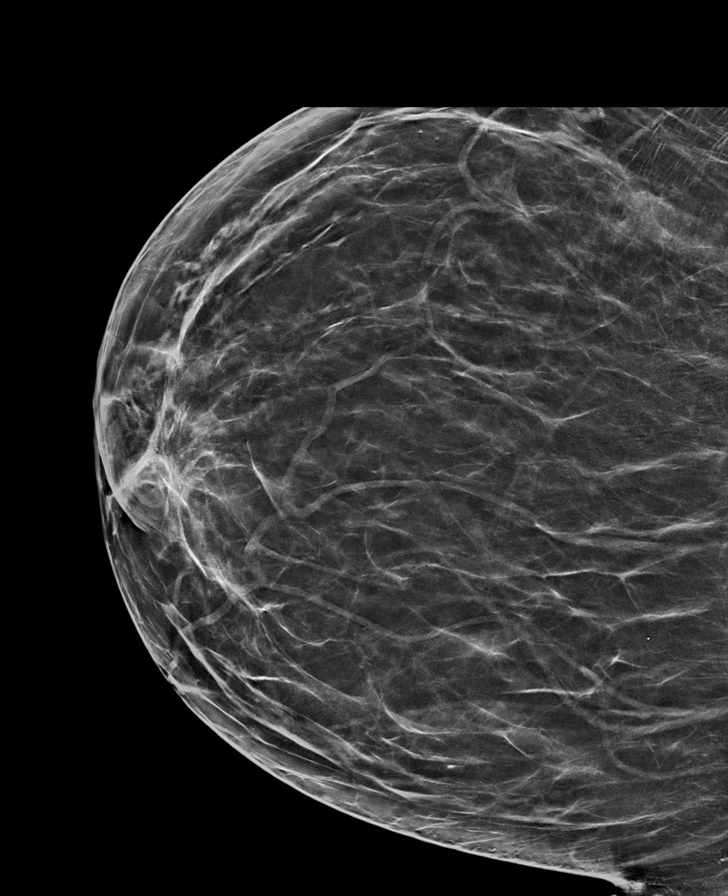

[R MLO synth-2D (1 of 2)]
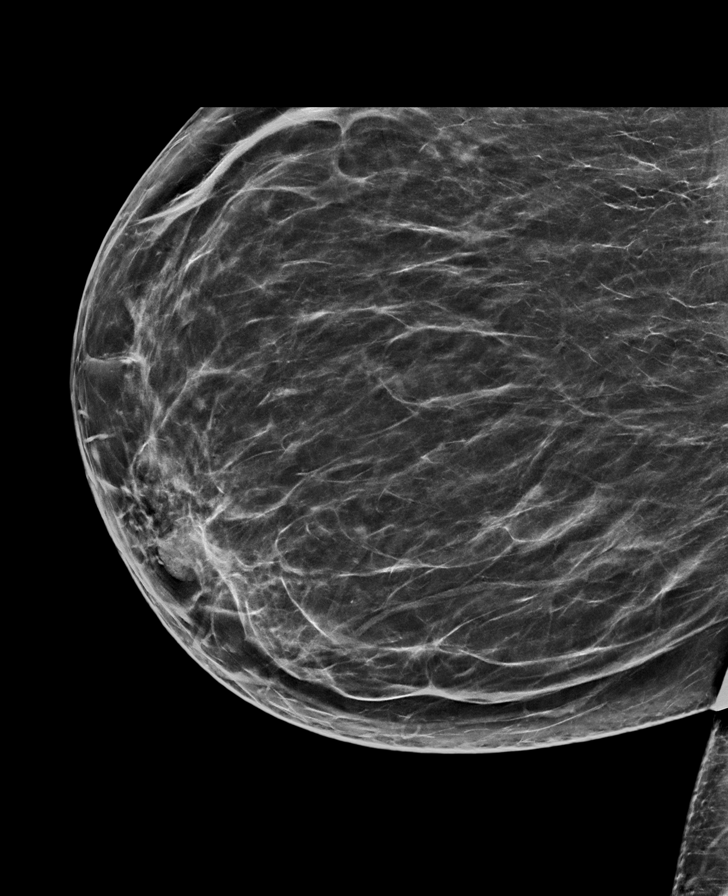

[L CC synth-2D]
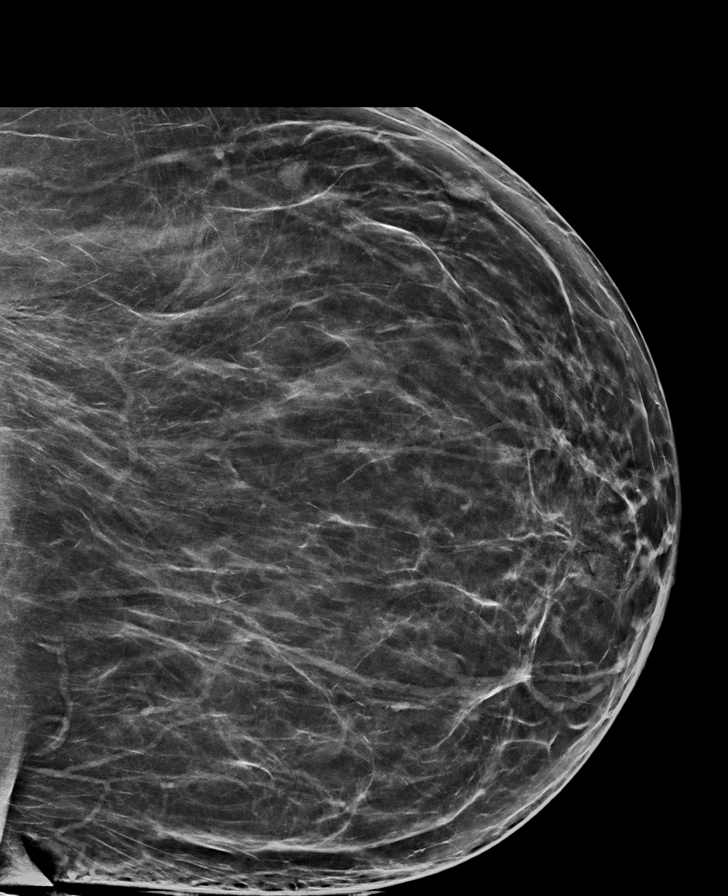

[R MLO synth-2D (2 of 2)]
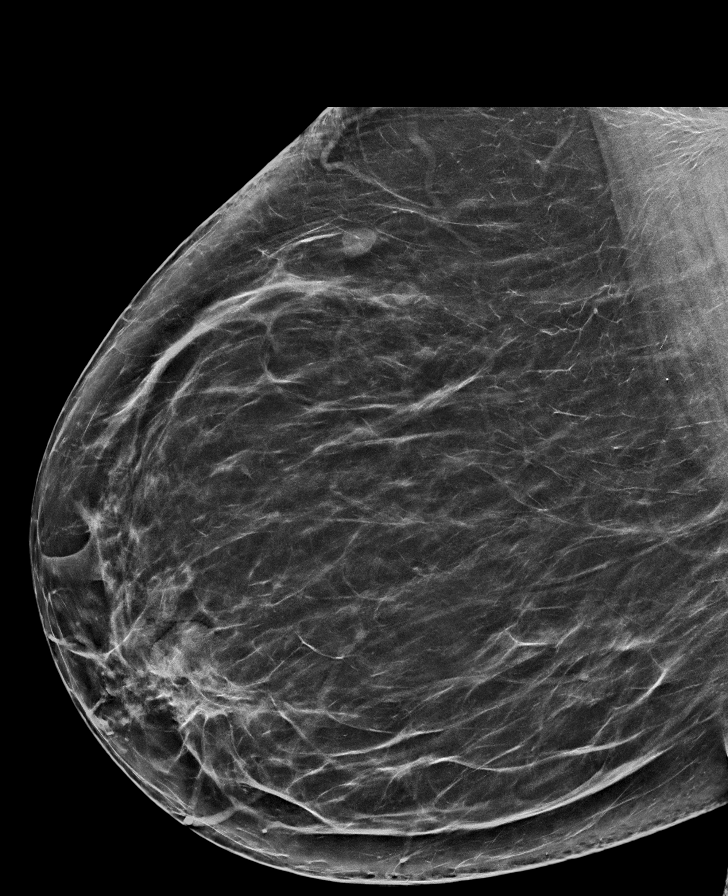

[R CV synth-2D]
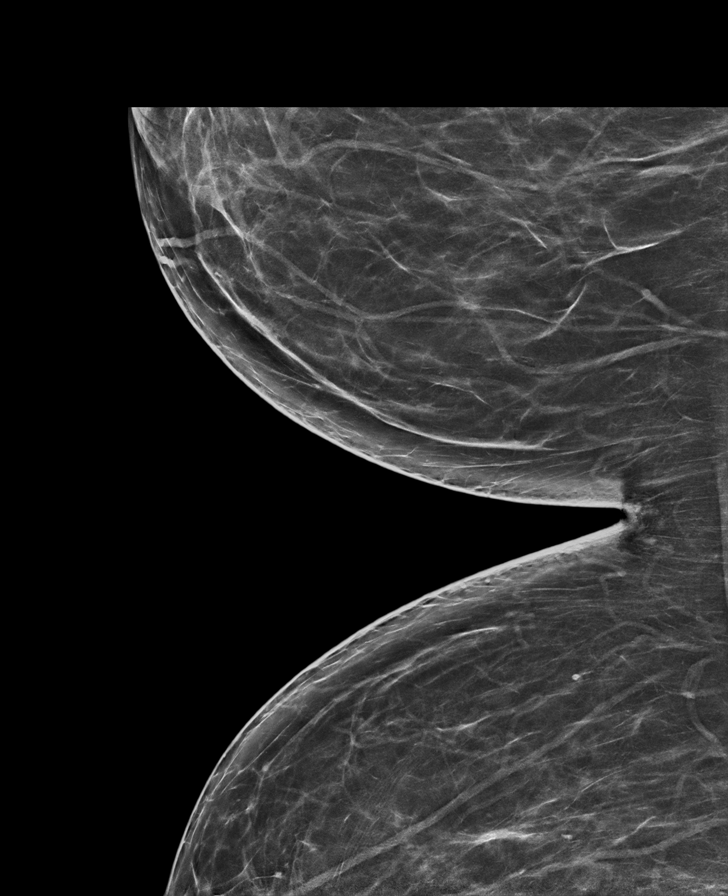

[L MLO synth-2D]
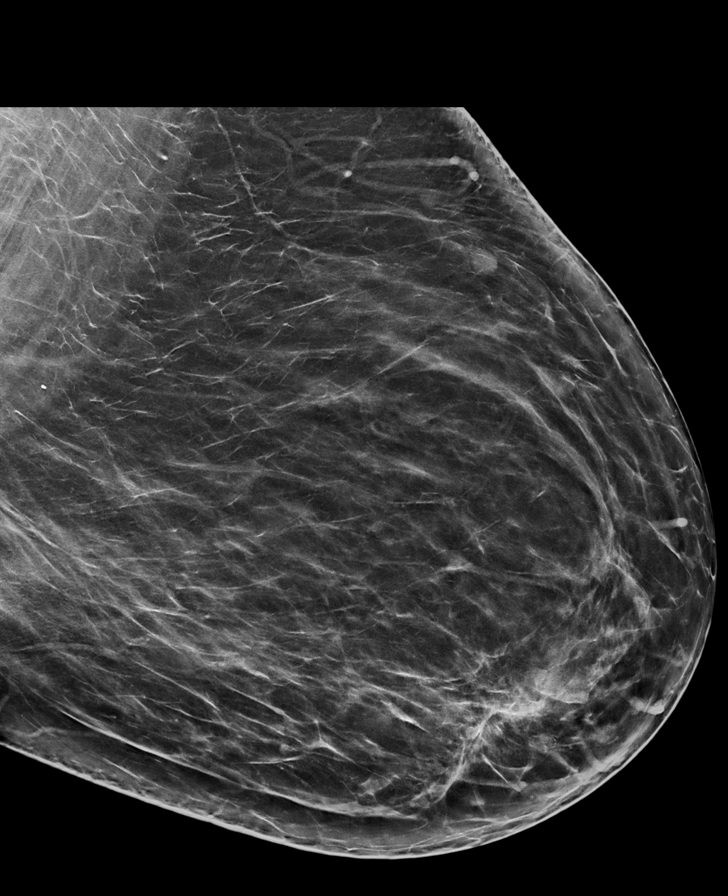

[R CC tomo · tomo slice 39/77.0]
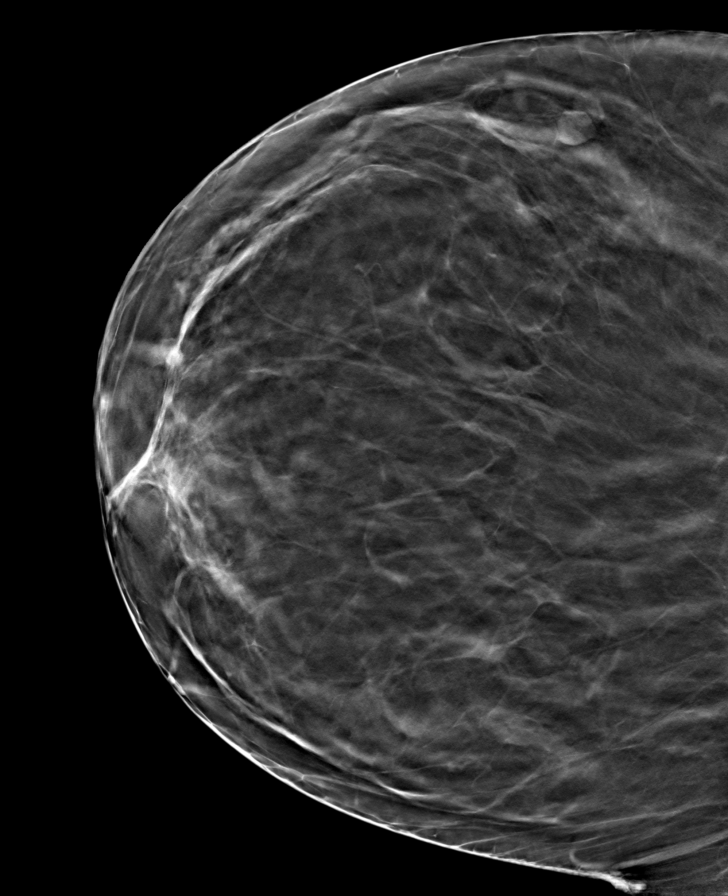

[8 of 37 positions shown; findings below may reference images not displayed]

ACR Breast Density Category b: There are scattered areas of
fibroglandular density.
FINDINGS: There are no findings suspicious for malignancy. Images were
processed with CAD.
IMPRESSION: No mammographic evidence of malignancy. A result letter of this
screening mammogram will be mailed directly to the patient.

RECOMMENDATION:
Screening mammogram in one year. (Code:Y5-G-EJ6)

BI-RADS CATEGORY  1: Negative.

## 2020-09-11 ENCOUNTER — Telehealth: Payer: Self-pay

## 2020-09-11 NOTE — Telephone Encounter (Signed)
Received referral on pt- attempted and leave several voicemail's. Closed referral and notified PCP via fax

## 2021-04-21 DIAGNOSIS — I517 Cardiomegaly: Secondary | ICD-10-CM | POA: Insufficient documentation

## 2021-05-04 ENCOUNTER — Encounter: Payer: Self-pay | Admitting: Internal Medicine

## 2021-06-17 ENCOUNTER — Ambulatory Visit: Payer: No Typology Code available for payment source | Admitting: Internal Medicine

## 2021-11-11 ENCOUNTER — Encounter (INDEPENDENT_AMBULATORY_CARE_PROVIDER_SITE_OTHER): Payer: Self-pay | Admitting: *Deleted

## 2022-01-04 ENCOUNTER — Ambulatory Visit (INDEPENDENT_AMBULATORY_CARE_PROVIDER_SITE_OTHER): Payer: No Typology Code available for payment source | Admitting: Gastroenterology

## 2022-01-04 ENCOUNTER — Encounter (INDEPENDENT_AMBULATORY_CARE_PROVIDER_SITE_OTHER): Payer: Self-pay | Admitting: Gastroenterology

## 2022-01-04 ENCOUNTER — Other Ambulatory Visit (INDEPENDENT_AMBULATORY_CARE_PROVIDER_SITE_OTHER): Payer: Self-pay

## 2022-01-04 ENCOUNTER — Encounter (INDEPENDENT_AMBULATORY_CARE_PROVIDER_SITE_OTHER): Payer: Self-pay

## 2022-01-04 VITALS — BP 146/78 | HR 74 | Temp 98.7°F | Ht 65.25 in | Wt 273.7 lb

## 2022-01-04 DIAGNOSIS — R1010 Upper abdominal pain, unspecified: Secondary | ICD-10-CM

## 2022-01-04 DIAGNOSIS — R112 Nausea with vomiting, unspecified: Secondary | ICD-10-CM | POA: Insufficient documentation

## 2022-01-04 NOTE — Patient Instructions (Signed)
It was nice to meet you! I'm glad you are feeling better As discussed, with the improvement of your symptoms with the pantoprazole I am concerned about the possibility of gastritis or an ulcer We will get you scheduled for upper endoscopy for further evaluation Please consider screening colonoscopy for colon cancer as the age to start these is now 67 Continue pantoprazole once daily Please avoid NSAIDs (advil, aleve, naproxen, goody powder, ibuprofen) as these can be very hard on your GI tract, causing inflammation, ulcers and damage to the lining of your GI tract.   Follow up 3 months

## 2022-01-04 NOTE — H&P (View-Only) (Signed)
Referring Provider: Shelby Dubin, FNP Primary Care Physician:  Shelby Dubin, FNP Primary GI Physician: new  Chief Complaint  Patient presents with   Pancreatitis    Patient referred for pancreatitis. States about every 3 months she gets pain in RUQ and vomiting. Patient states symptoms started after taking ozempic. She took for 6 weeks then stopped med. Stopped med back in December. Last ED visit was in May. Still having some nausea.    HPI:   Melissa [name] is a 46 y.o. female with past medical history of DM, hiatal hernia, HTN, paroxysmal a fib( on eliquis)  Patient presenting today as a new patient for abdominal pain and nausea.    History: Recent imaging with CT A/P wo 11/09/21 with only diverticulosis, notably three other CTs of the abdomen since Oct 2022 all without acute findings/diverticulosis. [place] RUQ 04/21/21 with no distention, gallstones, or thickening.   Lipase 11/11/21 114, 188 on 11/09/21 notably mildly elevated for the past few months as high as 267 in oct 2022, hgb 9.2, MCV 74.8, LFTs WNL.  Today, she states that she started ozempic last october, she started having constant nausea and RUQ pain. She has had 2 visits to the hospital due to ongoing vomiting. Last episode was around mothers day when she ate, started having abdominal pain nausea and vomiting that would not stop. She was given nausea and pain medications. She was admitted after last ED visit due to acute kidney injury from vomiting but there was no definitive diagnosis. She reports that she only remained on the ozempic for about 6 weeks. She had pretty constant nausea on the medication, nausea lingered intermittently after she stopped it. Vomiting would always start after eating a meal and upper abdominal pain would follow. She was started on pantoprazole after last hospital visit and has had almost complete resolution of symptoms since then. She denies heartburn or acid regurgitation, prior to starting PPI or  currently. Denies rectal bleeding or melena. Previously having some early satiety, this has improved recently as well. Denies any dysphagia or odynophagia.   Denies any diarrhea, she recently was started back on iron and has some constipation since then. She does not take anything for constipation. She has IDA secondary to very heavy/frequent menstrual cycles and has them. OB GYN did recent biopsy due to thickening of endometrium on recent CT, she recently started on mini pill to try and decrease intensity of her menstrual cycles.   NSAID use: none Social hx: occasional glass of wine, no tobacco  Fam hx: no CRC or liver disease   Last Colonoscopy:never  Last Endoscopy: never  Recommendations:    Past Medical History:  Diagnosis Date   Diabetes mellitus without complication (HCC)    History of hiatal hernia    Hypertension     Past Surgical History:  Procedure Laterality Date   NO PAST SURGERIES      Current Outpatient Medications  Medication Sig Dispense Refill   amLODipine (NORVASC) 10 MG tablet Take 10 mg by mouth daily.     atorvastatin (LIPITOR) 80 MG tablet 1 tablet     citalopram (CELEXA) 20 MG tablet TAKE 1 and a half TABLETs BY MOUTH ONCE DAILY FOR ANXIETY FOR 30 DAYS     diazepam (VALIUM) 2 MG tablet TAKE 1 TABLET BY MOUTH ONCE DAILY AS NEEDED FOR 14 DAYS     ELIQUIS 5 MG TABS tablet SMARTSIG:1 Tablet(s) By Mouth Every 12 Hours     ferrous sulfate  325 (65 FE) MG tablet 1 tablet     glimepiride (AMARYL) 4 MG tablet Take 4 mg by mouth every morning.     IRON PO Take by mouth.     JENCYCLA 0.35 MG tablet Take 1 tablet by mouth daily.     metoprolol succinate (TOPROL-XL) 25 MG 24 hr tablet Take 25 mg by mouth daily.     pantoprazole (PROTONIX) 40 MG tablet Take 40 mg by mouth daily. Takes one in the afternoon     pioglitazone (ACTOS) 15 MG tablet Take 1 tablet by mouth daily.     triamcinolone cream (KENALOG) 0.5 % Apply topically daily.     Vitamin D, Ergocalciferol,  (DRISDOL) 1.25 MG (50000 UNIT) CAPS capsule Take 50,000 Units by mouth once a week.     No current facility-administered medications for this visit.    Allergies as of 01/04/2022   (No Known Allergies)    No family history on file.  Social History   Socioeconomic History   Marital status: Married    Spouse name: Not on file   Number of children: Not on file   Years of education: Not on file   Highest education level: Not on file  Occupational History   Not on file  Tobacco Use   Smoking status: Former    Types: Cigarettes    Passive exposure: Past   Smokeless tobacco: Former  Substance and Sexual Activity   Alcohol use: Yes    Comment: occasional drink   Drug use: No   Sexual activity: Never  Other Topics Concern   Not on file  Social History Narrative   Not on file   Social Determinants of Health   Financial Resource Strain: Not on file  Food Insecurity: Not on file  Transportation Needs: Not on file  Physical Activity: Not on file  Stress: Not on file  Social Connections: Not on file   Review of systems General: negative for malaise, night sweats, fever, chills, weight loss Neck: Negative for lumps, goiter, pain and significant neck swelling Resp: Negative for cough, wheezing, dyspnea at rest CV: Negative for chest pain, leg swelling, palpitations, orthopnea GI: denies melena, hematochezia,  diarrhea, constipation, dysphagia, odyonophagia, early satiety or unintentional weight loss. +nausea +vomiting +early satiety MSK: Negative for joint pain or swelling, back pain, and muscle pain. Derm: Negative for itching or rash Psych: Denies depression, anxiety, memory loss, confusion. No homicidal or suicidal ideation.  Heme: Negative for prolonged bleeding, bruising easily, and swollen nodes. Endocrine: Negative for cold or heat intolerance, polyuria, polydipsia and goiter. Neuro: negative for tremor, gait imbalance, syncope and seizures. The remainder of the review  of systems is noncontributory.  Physical Exam: There were no vitals taken for this visit. General:   Alert and oriented. No distress noted. Pleasant and cooperative.  Head:  Normocephalic and atraumatic. Eyes:  Conjuctiva clear without scleral icterus. Mouth:  Oral mucosa pink and moist. Good dentition. No lesions. Heart: Normal rate and rhythm, s1 and s2 heart sounds present.  Lungs: Clear lung sounds in all lobes. Respirations equal and unlabored. Abdomen:  +BS, soft, non-tender and non-distended. No rebound or guarding. No HSM or masses noted. Derm: No palmar erythema or jaundice Msk:  Symmetrical without gross deformities. Normal posture. Extremities:  Without edema. Neurologic:  Alert and  oriented x4 Psych:  Alert and cooperative. Normal mood and affect.  Invalid input(s): "6 MONTHS"   ASSESSMENT: Meaghann S Rapley is a 45 y.o. female presenting today as a   new patient for upper abdominal pain, nausea and vomiting.  GI symptoms began in oct 2022 after initiation of ozempic, which she stopped in December but continued to have intermittent nausea with a few episodes of intractable vomiting that she was admitted to the hospital for. Notably lipase was elevated as high as 267 after the start of ozempic, however, multiple CT imaging done without findings of pancreatitis or any other acute abnormalities to explain her symptoms. RUQ US done in oct 2022 was also unremarkable. She does report almost complete resolution of symptoms since starting pantoprazole 40mg  daily after last hospitalization. Ozempic is known to cause secondary elevations in lipase/amylase as well as gastritis. It was queried if she may have gastroparesis previously, given a1c is around 8, however, I think the likelihood of this is low given symptom resolution with PPI and no other interventions, suspect she could have gastritis or even PUD, possibly secondary to Ozempic. Recommend proceeding with EGD for further evaluation.  Indications, risks and benefits of procedure discussed in detail with patient. Patient verbalized understanding and is in agreement to proceed with EGD at this time. Will continue on pantoprazole daily, and should avoid NSAIDs.  Given patient is 8, discussed indications for CRC screening via colonoscopy, however, patient prefers to hold off on this for now. She states she knows she needs to have this done and is amenable to scheduling in the near future but prefers to proceed with only EGD at this time.    PLAN:  Pt to make me aware when she is ready to proceed with CRC screening colonoscopy  2. EGD-ASA III/ENDO 3 (pt on eliquis) 3. Continue on pantoprazole 40mg  daily 4. Avoid NSAIDs   All questions were answered, patient verbalized understanding and is in agreement with plan as outlined above.    Follow Up: 3 months  Marrah Vanevery L. 54, MSN, APRN, AGNP-C Adult-Gerontology Nurse Practitioner [facility]

## 2022-01-04 NOTE — Progress Notes (Signed)
Referring Provider: Shelby Dubin, FNP Primary Care Physician:  Shelby Dubin, FNP Primary GI Physician: new  Chief Complaint  Patient presents with   Pancreatitis    Patient referred for pancreatitis. States about every 3 months she gets pain in RUQ and vomiting. Patient states symptoms started after taking ozempic. She took for 6 weeks then stopped med. Stopped med back in December. Last ED visit was in May. Still having some nausea.    HPI:   Melissa [name] is a 46 y.o. female with past medical history of DM, hiatal hernia, HTN, paroxysmal a fib( on eliquis)  Patient presenting today as a new patient for abdominal pain and nausea.    History: Recent imaging with CT A/P wo 11/09/21 with only diverticulosis, notably three other CTs of the abdomen since Oct 2022 all without acute findings/diverticulosis. [place] RUQ 04/21/21 with no distention, gallstones, or thickening.   Lipase 11/11/21 114, 188 on 11/09/21 notably mildly elevated for the past few months as high as 267 in oct 2022, hgb 9.2, MCV 74.8, LFTs WNL.  Today, she states that she started ozempic last october, she started having constant nausea and RUQ pain. She has had 2 visits to the hospital due to ongoing vomiting. Last episode was around mothers day when she ate, started having abdominal pain nausea and vomiting that would not stop. She was given nausea and pain medications. She was admitted after last ED visit due to acute kidney injury from vomiting but there was no definitive diagnosis. She reports that she only remained on the ozempic for about 6 weeks. She had pretty constant nausea on the medication, nausea lingered intermittently after she stopped it. Vomiting would always start after eating a meal and upper abdominal pain would follow. She was started on pantoprazole after last hospital visit and has had almost complete resolution of symptoms since then. She denies heartburn or acid regurgitation, prior to starting PPI or  currently. Denies rectal bleeding or melena. Previously having some early satiety, this has improved recently as well. Denies any dysphagia or odynophagia.   Denies any diarrhea, she recently was started back on iron and has some constipation since then. She does not take anything for constipation. She has IDA secondary to very heavy/frequent menstrual cycles and has them. OB GYN did recent biopsy due to thickening of endometrium on recent CT, she recently started on mini pill to try and decrease intensity of her menstrual cycles.   NSAID use: none Social hx: occasional glass of wine, no tobacco  Fam hx: no CRC or liver disease   Last Colonoscopy:never  Last Endoscopy: never  Recommendations:    Past Medical History:  Diagnosis Date   Diabetes mellitus without complication (HCC)    History of hiatal hernia    Hypertension     Past Surgical History:  Procedure Laterality Date   NO PAST SURGERIES      Current Outpatient Medications  Medication Sig Dispense Refill   amLODipine (NORVASC) 10 MG tablet Take 10 mg by mouth daily.     atorvastatin (LIPITOR) 80 MG tablet 1 tablet     citalopram (CELEXA) 20 MG tablet TAKE 1 and a half TABLETs BY MOUTH ONCE DAILY FOR ANXIETY FOR 30 DAYS     diazepam (VALIUM) 2 MG tablet TAKE 1 TABLET BY MOUTH ONCE DAILY AS NEEDED FOR 14 DAYS     ELIQUIS 5 MG TABS tablet SMARTSIG:1 Tablet(s) By Mouth Every 12 Hours     ferrous sulfate  325 (65 FE) MG tablet 1 tablet     glimepiride (AMARYL) 4 MG tablet Take 4 mg by mouth every morning.     IRON PO Take by mouth.     JENCYCLA 0.35 MG tablet Take 1 tablet by mouth daily.     metoprolol succinate (TOPROL-XL) 25 MG 24 hr tablet Take 25 mg by mouth daily.     pantoprazole (PROTONIX) 40 MG tablet Take 40 mg by mouth daily. Takes one in the afternoon     pioglitazone (ACTOS) 15 MG tablet Take 1 tablet by mouth daily.     triamcinolone cream (KENALOG) 0.5 % Apply topically daily.     Vitamin D, Ergocalciferol,  (DRISDOL) 1.25 MG (50000 UNIT) CAPS capsule Take 50,000 Units by mouth once a week.     No current facility-administered medications for this visit.    Allergies as of 01/04/2022   (No Known Allergies)    No family history on file.  Social History   Socioeconomic History   Marital status: Married    Spouse name: Not on file   Number of children: Not on file   Years of education: Not on file   Highest education level: Not on file  Occupational History   Not on file  Tobacco Use   Smoking status: Former    Types: Cigarettes    Passive exposure: Past   Smokeless tobacco: Former  Substance and Sexual Activity   Alcohol use: Yes    Comment: occasional drink   Drug use: No   Sexual activity: Never  Other Topics Concern   Not on file  Social History Narrative   Not on file   Social Determinants of Health   Financial Resource Strain: Not on file  Food Insecurity: Not on file  Transportation Needs: Not on file  Physical Activity: Not on file  Stress: Not on file  Social Connections: Not on file   Review of systems General: negative for malaise, night sweats, fever, chills, weight loss Neck: Negative for lumps, goiter, pain and significant neck swelling Resp: Negative for cough, wheezing, dyspnea at rest CV: Negative for chest pain, leg swelling, palpitations, orthopnea GI: denies melena, hematochezia,  diarrhea, constipation, dysphagia, odyonophagia, early satiety or unintentional weight loss. +nausea +vomiting +early satiety MSK: Negative for joint pain or swelling, back pain, and muscle pain. Derm: Negative for itching or rash Psych: Denies depression, anxiety, memory loss, confusion. No homicidal or suicidal ideation.  Heme: Negative for prolonged bleeding, bruising easily, and swollen nodes. Endocrine: Negative for cold or heat intolerance, polyuria, polydipsia and goiter. Neuro: negative for tremor, gait imbalance, syncope and seizures. The remainder of the review  of systems is noncontributory.  Physical Exam: There were no vitals taken for this visit. General:   Alert and oriented. No distress noted. Pleasant and cooperative.  Head:  Normocephalic and atraumatic. Eyes:  Conjuctiva clear without scleral icterus. Mouth:  Oral mucosa pink and moist. Good dentition. No lesions. Heart: Normal rate and rhythm, s1 and s2 heart sounds present.  Lungs: Clear lung sounds in all lobes. Respirations equal and unlabored. Abdomen:  +BS, soft, non-tender and non-distended. No rebound or guarding. No HSM or masses noted. Derm: No palmar erythema or jaundice Msk:  Symmetrical without gross deformities. Normal posture. Extremities:  Without edema. Neurologic:  Alert and  oriented x4 Psych:  Alert and cooperative. Normal mood and affect.  Invalid input(s): "6 MONTHS"   ASSESSMENT: ZIPORA VANEPS is a 45 y.o. female presenting today as a  new patient for upper abdominal pain, nausea and vomiting.  GI symptoms began in oct 2022 after initiation of ozempic, which she stopped in December but continued to have intermittent nausea with a few episodes of intractable vomiting that she was admitted to the hospital for. Notably lipase was elevated as high as 267 after the start of ozempic, however, multiple CT imaging done without findings of pancreatitis or any other acute abnormalities to explain her symptoms. RUQ US done in oct 2022 was also unremarkable. She does report almost complete resolution of symptoms since starting pantoprazole 40mg  daily after last hospitalization. Ozempic is known to cause secondary elevations in lipase/amylase as well as gastritis. It was queried if she may have gastroparesis previously, given a1c is around 8, however, I think the likelihood of this is low given symptom resolution with PPI and no other interventions, suspect she could have gastritis or even PUD, possibly secondary to Ozempic. Recommend proceeding with EGD for further evaluation.  Indications, risks and benefits of procedure discussed in detail with patient. Patient verbalized understanding and is in agreement to proceed with EGD at this time. Will continue on pantoprazole daily, and should avoid NSAIDs.  Given patient is 8, discussed indications for CRC screening via colonoscopy, however, patient prefers to hold off on this for now. She states she knows she needs to have this done and is amenable to scheduling in the near future but prefers to proceed with only EGD at this time.    PLAN:  Pt to make me aware when she is ready to proceed with CRC screening colonoscopy  2. EGD-ASA III/ENDO 3 (pt on eliquis) 3. Continue on pantoprazole 40mg  daily 4. Avoid NSAIDs   All questions were answered, patient verbalized understanding and is in agreement with plan as outlined above.    Follow Up: 3 months  Macklen Wilhoite L. 54, MSN, APRN, AGNP-C Adult-Gerontology Nurse Practitioner [facility]

## 2022-01-05 ENCOUNTER — Encounter (INDEPENDENT_AMBULATORY_CARE_PROVIDER_SITE_OTHER): Payer: Self-pay

## 2022-01-21 ENCOUNTER — Telehealth (INDEPENDENT_AMBULATORY_CARE_PROVIDER_SITE_OTHER): Payer: Self-pay

## 2022-01-21 NOTE — Telephone Encounter (Signed)
Per Colvin Caroli, FNP Meredith Mody 14-May-1976 may hold her Eliquis two days prior and patient is aware

## 2022-01-21 NOTE — Telephone Encounter (Signed)
Thanks

## 2022-01-27 NOTE — Patient Instructions (Signed)
Melissa [name]  01/27/2022     @PREFPERIOPPHARMACY @   Your procedure is scheduled on  02/02/2022.   Report to 04/04/2022 at 1300 (1:00)  P.M.   Call this number if you have problems the morning of surgery:  [phone]   Remember:  Follow the diet instructions given to you by the office.      Your last dose of eliquis should be on 01/30/2022.      DO NOT take any medications for diabetes the morning of your procedure.     Take these medicines the morning of surgery with A SIP OF WATER                 norvasc, celexa, toprol XL, protonix.     Do not wear jewelry, make-up or nail polish.  Do not wear lotions, powders, or perfumes, or deodorant.  Do not shave 48 hours prior to surgery.  Men may shave face and neck.  Do not bring valuables to the hospital.  [facility] is not responsible for any belongings or valuables.  Contacts, dentures or bridgework may not be worn into surgery.  Leave your suitcase in the car.  After surgery it may be brought to your room.  For patients admitted to the hospital, discharge time will be determined by your treatment team.  Patients discharged the day of surgery will not be allowed to drive home and must have someone with them for 24 hours.    Special instructions:   DO NOT smoke tobacco or vape for 24 hours before your procedure.  Please read over the following fact sheets that you were given. Anesthesia Post-op Instructions and Care and Recovery After Surgery      Upper Endoscopy, Adult, Care After This sheet gives you information about how to care for yourself after your procedure. Your health care provider may also give you more specific instructions. If you have problems or questions, contact your health care provider. What can I expect after the procedure? After the procedure, it is common to have: A sore throat. Mild stomach pain or discomfort. Bloating. Nausea. Follow these instructions at home:  Follow  instructions from your health care provider about what to eat or drink after your procedure. Return to your normal activities as told by your health care provider. Ask your health care provider what activities are safe for you. Take over-the-counter and prescription medicines only as told by your health care provider. If you were given a sedative during the procedure, it can affect you for several hours. Do not drive or operate machinery until your health care provider says that it is safe. Keep all follow-up visits as told by your health care provider. This is important. Contact a health care provider if you have: A sore throat that lasts longer than one day. Trouble swallowing. Get help right away if: You vomit blood or your vomit looks like coffee grounds. You have: A fever. Bloody, black, or tarry stools. A severe sore throat or you cannot swallow. Difficulty breathing. Severe pain in your chest or abdomen. Summary After the procedure, it is common to have a sore throat, mild stomach discomfort, bloating, and nausea. If you were given a sedative during the procedure, it can affect you for several hours. Do not drive or operate machinery until your health care provider says that it is safe. Follow instructions from your health care provider about what to eat or drink after your procedure. Return  to your normal activities as told by your health care provider. This information is not intended to replace advice given to you by your health care provider. Make sure you discuss any questions you have with your health care provider. Document Revised: 04/20/2019 Document Reviewed: 11/14/2017 Elsevier Patient Education  2023 [employer] Monitored Anesthesia Care, Care After This sheet gives you information about how to care for yourself after your procedure. Your health care provider may also give you more specific instructions. If you have problems or questions, contact your health care  provider. What can I expect after the procedure? After the procedure, it is common to have: Tiredness. Forgetfulness about what happened after the procedure. Impaired judgment for important decisions. Nausea or vomiting. Some difficulty with balance. Follow these instructions at home: For the time period you were told by your health care provider:     Rest as needed. Do not participate in activities where you could fall or become injured. Do not drive or use machinery. Do not drink alcohol. Do not take sleeping pills or medicines that cause drowsiness. Do not make important decisions or sign legal documents. Do not take care of children on your own. Eating and drinking Follow the diet that is recommended by your health care provider. Drink enough fluid to keep your urine pale yellow. If you vomit: Drink water, juice, or soup when you can drink without vomiting. Make sure you have little or no nausea before eating solid foods. General instructions Have a responsible adult stay with you for the time you are told. It is important to have someone help care for you until you are awake and alert. Take over-the-counter and prescription medicines only as told by your health care provider. If you have sleep apnea, surgery and certain medicines can increase your risk for breathing problems. Follow instructions from your health care provider about wearing your sleep device: Anytime you are sleeping, including during daytime naps. While taking prescription pain medicines, sleeping medicines, or medicines that make you drowsy. Avoid smoking. Keep all follow-up visits as told by your health care provider. This is important. Contact a health care provider if: You keep feeling nauseous or you keep vomiting. You feel light-headed. You are still sleepy or having trouble with balance after 24 hours. You develop a rash. You have a fever. You have redness or swelling around the IV site. Get help  right away if: You have trouble breathing. You have new-onset confusion at home. Summary For several hours after your procedure, you may feel tired. You may also be forgetful and have poor judgment. Have a responsible adult stay with you for the time you are told. It is important to have someone help care for you until you are awake and alert. Rest as told. Do not drive or operate machinery. Do not drink alcohol or take sleeping pills. Get help right away if you have trouble breathing, or if you suddenly become confused. This information is not intended to replace advice given to you by your health care provider. Make sure you discuss any questions you have with your health care provider. Document Revised: 05/19/2021 Document Reviewed: 05/17/2019 Elsevier Patient Education  2023 [employer].

## 2022-01-28 ENCOUNTER — Encounter (HOSPITAL_COMMUNITY)
Admission: RE | Admit: 2022-01-28 | Discharge: 2022-01-28 | Disposition: A | Discharge status: Home / Self Care | Payer: No Typology Code available for payment source | Source: Ambulatory Visit | Attending: Gastroenterology | Admitting: Gastroenterology

## 2022-01-28 DIAGNOSIS — I10 Essential (primary) hypertension: Secondary | ICD-10-CM

## 2022-01-28 DIAGNOSIS — Z01818 Encounter for other preprocedural examination: Secondary | ICD-10-CM

## 2022-01-28 DIAGNOSIS — E119 Type 2 diabetes mellitus without complications: Secondary | ICD-10-CM

## 2022-01-28 DIAGNOSIS — D649 Anemia, unspecified: Secondary | ICD-10-CM

## 2022-02-01 ENCOUNTER — Encounter (HOSPITAL_COMMUNITY): Payer: Self-pay

## 2022-02-01 ENCOUNTER — Encounter (HOSPITAL_COMMUNITY)
Admission: RE | Admit: 2022-02-01 | Discharge: 2022-02-01 | Disposition: A | Discharge status: Home / Self Care | Payer: No Typology Code available for payment source | Source: Ambulatory Visit | Attending: Gastroenterology | Admitting: Gastroenterology

## 2022-02-01 DIAGNOSIS — E119 Type 2 diabetes mellitus without complications: Secondary | ICD-10-CM | POA: Diagnosis not present

## 2022-02-01 DIAGNOSIS — Z01818 Encounter for other preprocedural examination: Secondary | ICD-10-CM | POA: Insufficient documentation

## 2022-02-01 DIAGNOSIS — I10 Essential (primary) hypertension: Secondary | ICD-10-CM | POA: Insufficient documentation

## 2022-02-01 DIAGNOSIS — D649 Anemia, unspecified: Secondary | ICD-10-CM | POA: Diagnosis not present

## 2022-02-01 HISTORY — DX: Other complications of anesthesia, initial encounter: T88.59XA

## 2022-02-01 HISTORY — DX: Nausea with vomiting, unspecified: Z98.890

## 2022-02-01 HISTORY — DX: Anxiety disorder, unspecified: F41.9

## 2022-02-01 HISTORY — DX: Anemia, unspecified: D64.9

## 2022-02-01 HISTORY — DX: Gastro-esophageal reflux disease without esophagitis: K21.9

## 2022-02-01 HISTORY — DX: Nausea with vomiting, unspecified: R11.2

## 2022-02-01 HISTORY — DX: Cardiac arrhythmia, unspecified: I49.9

## 2022-02-01 LAB — CBC WITH DIFFERENTIAL/PLATELET
Abs Immature Granulocytes: 0.02 10*3/uL (ref 0.00–0.07)
Basophils Absolute: 0 10*3/uL (ref 0.0–0.1)
Basophils Relative: 0 %
Eosinophils Absolute: 0.2 10*3/uL (ref 0.0–0.5)
Eosinophils Relative: 3 %
HCT: 33.5 % — ABNORMAL LOW (ref 36.0–46.0)
Hemoglobin: 10 g/dL — ABNORMAL LOW (ref 12.0–15.0)
Immature Granulocytes: 0 %
Lymphocytes Relative: 25 %
Lymphs Abs: 1.6 10*3/uL (ref 0.7–4.0)
MCH: 23.5 pg — ABNORMAL LOW (ref 26.0–34.0)
MCHC: 29.9 g/dL — ABNORMAL LOW (ref 30.0–36.0)
MCV: 78.6 fL — ABNORMAL LOW (ref 80.0–100.0)
Monocytes Absolute: 0.6 10*3/uL (ref 0.1–1.0)
Monocytes Relative: 9 %
Neutro Abs: 4.1 10*3/uL (ref 1.7–7.7)
Neutrophils Relative %: 63 %
Platelets: 355 10*3/uL (ref 150–400)
RBC: 4.26 MIL/uL (ref 3.87–5.11)
RDW: 20.2 % — ABNORMAL HIGH (ref 11.5–15.5)
WBC: 6.6 10*3/uL (ref 4.0–10.5)
nRBC: 0 % (ref 0.0–0.2)

## 2022-02-01 LAB — BASIC METABOLIC PANEL
Anion gap: 6 (ref 5–15)
BUN: 31 mg/dL — ABNORMAL HIGH (ref 6–20)
CO2: 20 mmol/L — ABNORMAL LOW (ref 22–32)
Calcium: 9.1 mg/dL (ref 8.9–10.3)
Chloride: 110 mmol/L (ref 98–111)
Creatinine, Ser: 1.4 mg/dL — ABNORMAL HIGH (ref 0.44–1.00)
GFR, Estimated: 47 mL/min — ABNORMAL LOW (ref >60)
Glucose, Bld: 220 mg/dL — ABNORMAL HIGH (ref 70–99)
Potassium: 4.1 mmol/L (ref 3.5–5.1)
Sodium: 136 mmol/L (ref 135–145)

## 2022-02-01 LAB — POCT PREGNANCY, URINE: Preg Test, Ur: NEGATIVE

## 2022-02-02 ENCOUNTER — Ambulatory Visit (HOSPITAL_COMMUNITY): Payer: No Typology Code available for payment source | Admitting: Anesthesiology

## 2022-02-02 ENCOUNTER — Encounter (HOSPITAL_COMMUNITY): Payer: Self-pay | Admitting: Gastroenterology

## 2022-02-02 ENCOUNTER — Encounter (HOSPITAL_COMMUNITY)
Admission: RE | Disposition: A | Discharge status: Home / Self Care | Payer: Self-pay | Source: Ambulatory Visit | Attending: Gastroenterology

## 2022-02-02 ENCOUNTER — Ambulatory Visit (HOSPITAL_BASED_OUTPATIENT_CLINIC_OR_DEPARTMENT_OTHER): Payer: No Typology Code available for payment source | Admitting: Anesthesiology

## 2022-02-02 ENCOUNTER — Ambulatory Visit (HOSPITAL_COMMUNITY)
Admission: RE | Admit: 2022-02-02 | Discharge: 2022-02-02 | Disposition: A | Discharge status: Home / Self Care | Payer: No Typology Code available for payment source | Source: Ambulatory Visit | Attending: Gastroenterology | Admitting: Gastroenterology

## 2022-02-02 DIAGNOSIS — D759 Disease of blood and blood-forming organs, unspecified: Secondary | ICD-10-CM | POA: Insufficient documentation

## 2022-02-02 DIAGNOSIS — I10 Essential (primary) hypertension: Secondary | ICD-10-CM | POA: Insufficient documentation

## 2022-02-02 DIAGNOSIS — K449 Diaphragmatic hernia without obstruction or gangrene: Secondary | ICD-10-CM | POA: Diagnosis not present

## 2022-02-02 DIAGNOSIS — R112 Nausea with vomiting, unspecified: Secondary | ICD-10-CM | POA: Diagnosis not present

## 2022-02-02 DIAGNOSIS — Z87891 Personal history of nicotine dependence: Secondary | ICD-10-CM

## 2022-02-02 DIAGNOSIS — Z79899 Other long term (current) drug therapy: Secondary | ICD-10-CM | POA: Insufficient documentation

## 2022-02-02 DIAGNOSIS — Z7901 Long term (current) use of anticoagulants: Secondary | ICD-10-CM | POA: Diagnosis not present

## 2022-02-02 DIAGNOSIS — F419 Anxiety disorder, unspecified: Secondary | ICD-10-CM | POA: Insufficient documentation

## 2022-02-02 DIAGNOSIS — I480 Paroxysmal atrial fibrillation: Secondary | ICD-10-CM | POA: Insufficient documentation

## 2022-02-02 DIAGNOSIS — Z7984 Long term (current) use of oral hypoglycemic drugs: Secondary | ICD-10-CM | POA: Insufficient documentation

## 2022-02-02 DIAGNOSIS — K219 Gastro-esophageal reflux disease without esophagitis: Secondary | ICD-10-CM | POA: Insufficient documentation

## 2022-02-02 DIAGNOSIS — Z8719 Personal history of other diseases of the digestive system: Secondary | ICD-10-CM | POA: Diagnosis not present

## 2022-02-02 DIAGNOSIS — D509 Iron deficiency anemia, unspecified: Secondary | ICD-10-CM | POA: Insufficient documentation

## 2022-02-02 DIAGNOSIS — R1010 Upper abdominal pain, unspecified: Secondary | ICD-10-CM

## 2022-02-02 DIAGNOSIS — K5900 Constipation, unspecified: Secondary | ICD-10-CM | POA: Diagnosis not present

## 2022-02-02 DIAGNOSIS — E119 Type 2 diabetes mellitus without complications: Secondary | ICD-10-CM | POA: Insufficient documentation

## 2022-02-02 HISTORY — PX: BIOPSY: SHX5522

## 2022-02-02 HISTORY — PX: ESOPHAGOGASTRODUODENOSCOPY (EGD) WITH PROPOFOL: SHX5813

## 2022-02-02 LAB — GLUCOSE, CAPILLARY: Glucose-Capillary: 138 mg/dL — ABNORMAL HIGH (ref 70–99)

## 2022-02-02 SURGERY — ESOPHAGOGASTRODUODENOSCOPY (EGD) WITH PROPOFOL
Anesthesia: General

## 2022-02-02 MED ORDER — LIDOCAINE HCL (CARDIAC) PF 100 MG/5ML IV SOSY
PREFILLED_SYRINGE | INTRAVENOUS | Status: DC | PRN
  Administered 2022-02-02: 50 mg via INTRATRACHEAL

## 2022-02-02 MED ORDER — PROPOFOL 10 MG/ML IV BOLUS
INTRAVENOUS | Status: DC | PRN
  Administered 2022-02-02: 100 mg via INTRAVENOUS
  Administered 2022-02-02: 50 mg via INTRAVENOUS
  Administered 2022-02-02: 20 mg via INTRAVENOUS
  Administered 2022-02-02: 30 mg via INTRAVENOUS
  Administered 2022-02-02: 50 mg via INTRAVENOUS

## 2022-02-02 MED ORDER — LACTATED RINGERS IV SOLN: INTRAVENOUS | Status: DC

## 2022-02-02 MED ORDER — DEXMEDETOMIDINE (PRECEDEX) IN NS 20 MCG/5ML (4 MCG/ML) IV SYRINGE
PREFILLED_SYRINGE | INTRAVENOUS | Status: DC | PRN
  Administered 2022-02-02: 8 ug via INTRAVENOUS

## 2022-02-02 NOTE — Transfer of Care (Signed)
Immediate Anesthesia Transfer of Care Note  Patient: Melissa [name]  Procedure(s) Performed: ESOPHAGOGASTRODUODENOSCOPY (EGD) WITH PROPOFOL BIOPSY  Patient Location: Short Stay  Anesthesia Type:MAC  Level of Consciousness: sedated, patient cooperative and responds to stimulation  Airway & Oxygen Therapy: Patient Spontanous Breathing  Post-op Assessment: Report given to RN and Post -op Vital signs reviewed and stable  Post vital signs: Reviewed and stable  Last Vitals:  Vitals Value Taken Time  BP 110/70 02/02/22 1506  Temp 36.5 C 02/02/22 1506  Pulse 89 02/02/22 1506  Resp 19 02/02/22 1506  SpO2 94 % 02/02/22 1506    Last Pain:  Vitals:   02/02/22 1506  TempSrc: Axillary  PainSc:       Patients Stated Pain Goal: 5 (58/52/77 [id number])  Complications: No notable events documented.

## 2022-02-02 NOTE — Discharge Instructions (Addendum)
You are being discharged home Resume your previous diet We are waiting for your pathology results Restart Eliquis tonight

## 2022-02-02 NOTE — Anesthesia Postprocedure Evaluation (Signed)
Anesthesia Post Note  Patient: Devanny S Baccari  Procedure(s) Performed: ESOPHAGOGASTRODUODENOSCOPY (EGD) WITH PROPOFOL BIOPSY  Patient location during evaluation: Phase II Anesthesia Type: General Level of consciousness: awake and alert and oriented Pain management: pain level controlled Vital Signs Assessment: post-procedure vital signs reviewed and stable Respiratory status: spontaneous breathing, nonlabored ventilation and respiratory function stable Cardiovascular status: blood pressure returned to baseline and stable Postop Assessment: no apparent nausea or vomiting Anesthetic complications: no   No notable events documented.   Last Vitals:  Vitals:   02/02/22 1332 02/02/22 1506  BP: (!) 142/91 110/70  Pulse: 94 89  Resp: 18 19  Temp: 36.9 C 36.5 C  SpO2: 99% 94%    Last Pain:  Vitals:   02/02/22 1506  TempSrc: Axillary  PainSc:                  Ainslie Mazurek C Josealberto Montalto

## 2022-02-02 NOTE — Op Note (Signed)
[facility] Patient Name: Melissa [name] Procedure Date: 02/02/2022 2:41 PM MRN: [id number] Date of Birth: Jan 23, 1976 Attending MD: Katrinka Blazing ,  CSN: [id number] Age: 46 Admit Type: Outpatient Procedure:                Upper GI endoscopy Indications:              Nausea with vomiting Providers:                Katrinka Blazing, Sheran Fava, Lennice Sites,                            Technician, Dyann Ruddle Referring MD:              Medicines:                Monitored Anesthesia Care Complications:            No immediate complications. Estimated Blood Loss:     Estimated blood loss: none. Procedure:                Pre-Anesthesia Assessment:                           - Prior to the procedure, a History and Physical                            was performed, and patient medications, allergies                            and sensitivities were reviewed. The patient's                            tolerance of previous anesthesia was reviewed.                           - The risks and benefits of the procedure and the                            sedation options and risks were discussed with the                            patient. All questions were answered and informed                            consent was obtained.                           - ASA Grade Assessment: II - A patient with mild                            systemic disease.                           After obtaining informed consent, the endoscope was                            passed under direct vision. Throughout the  procedure, the patient's blood pressure, pulse, and                            oxygen saturations were monitored continuously. The                            [id number]-H190 ([id number]) scope was introduced through the                            mouth, and advanced to the second part of duodenum.                            The upper GI endoscopy was accomplished without                             difficulty. The patient tolerated the procedure                            well. Scope In: 2:57:19 PM Scope Out: 3:01:42 PM Total Procedure Duration: 0 hours 4 minutes 23 seconds  Findings:      The Z-line was regular and was found 39 cm from the incisors.      The esophagus was normal.      The stomach was normal.      The examined duodenum was normal. Biopsies were taken with a cold       forceps for histology. Impression:               - Z-line regular, 39 cm from the incisors.                           - Normal esophagus.                           - Normal stomach.                           - Normal examined duodenum. Biopsied. Moderate Sedation:      Per Anesthesia Care Recommendation:           - Discharge patient to home (ambulatory).                           - Resume previous diet.                           - Await pathology results.                           - Continue present medications. Procedure Code(s):        --- Professional ---                           [phone], Esophagogastroduodenoscopy, flexible,                            transoral; with biopsy, single or multiple Diagnosis Code(s):        --- Professional ---  R11.2, Nausea with vomiting, unspecified CPT copyright 2019 American Medical Association. All rights reserved. The codes documented in this report are preliminary and upon coder review may  be revised to meet current compliance requirements. Maylon Peppers, MD Maylon Peppers,  02/02/2022 3:07:00 PM This report has been signed electronically. Number of Addenda: 0

## 2022-02-02 NOTE — Progress Notes (Deleted)
  February 02, 2022  Patient: Melissa [name]  Date of Birth: 10/09/1975  Date of Visit: 01/04/2022    To Whom It May Concern:  Melissa [name] had a procedure done at [facility].  Melissa [name] may return to work on 02/03/22 after 3:00 pm

## 2022-02-02 NOTE — Progress Notes (Signed)
  February 02, 2022  Patient: Melissa [name]  Date of Birth: 1976-02-27  Date of visit 02/02/2022    To Whom It May Concern:  Melissa [name] was seen in the short stay center at [facility] on 02/02/2022 Melissa [name]  may return to work on 08/09/20203 after 3 pm .  Sincerely,   Trenton Gammon, RN

## 2022-02-02 NOTE — Anesthesia Preprocedure Evaluation (Signed)
Anesthesia Evaluation  Patient identified by MRN, date of birth, ID band Patient awake    Reviewed: Allergy & Precautions, NPO status , Patient's Chart, lab work & pertinent test results, reviewed documented beta blocker date and time   History of Anesthesia Complications (+) PONV and history of anesthetic complications  Airway Mallampati: II  TM Distance: >3 FB Neck ROM: Full    Dental  (+) Dental Advisory Given, Chipped, Missing Broken teeth:   Pulmonary neg pulmonary ROS, former smoker,    Pulmonary exam normal breath sounds clear to auscultation       Cardiovascular Exercise Tolerance: Good hypertension, Pt. on medications and Pt. on home beta blockers + dysrhythmias Atrial Fibrillation  Rhythm:Irregular Rate:Normal  Occasional PVCs   Neuro/Psych PSYCHIATRIC DISORDERS Anxiety negative neurological ROS     GI/Hepatic Neg liver ROS, hiatal hernia, GERD  Medicated and Controlled,  Endo/Other  diabetes, Well Controlled, Type 2, Oral Hypoglycemic AgentsMorbid obesity  Renal/GU negative Renal ROS  negative genitourinary   Musculoskeletal negative musculoskeletal ROS (+)   Abdominal   Peds negative pediatric ROS (+)  Hematology  (+) Blood dyscrasia, anemia ,   Anesthesia Other Findings   Reproductive/Obstetrics negative OB ROS                            Anesthesia Physical Anesthesia Plan  ASA: 3  Anesthesia Plan: General   Post-op Pain Management: Minimal or no pain anticipated   Induction: Intravenous  PONV Risk Score and Plan: Propofol infusion  Airway Management Planned: Nasal Cannula and Natural Airway  Additional Equipment:   Intra-op Plan:   Post-operative Plan:   Informed Consent: I have reviewed the patients History and Physical, chart, labs and discussed the procedure including the risks, benefits and alternatives for the proposed anesthesia with the patient or  authorized representative who has indicated his/her understanding and acceptance.       Plan Discussed with: CRNA and Surgeon  Anesthesia Plan Comments:         Anesthesia Quick Evaluation

## 2022-02-02 NOTE — Interval H&P Note (Signed)
History and Physical Interval Note:  02/02/2022 2:17 PM  Melissa [name]  has presented today for surgery, with the diagnosis of Nausea Vomiting Upper Abdominal Pain.  The various methods of treatment have been discussed with the patient and family. After consideration of risks, benefits and other options for treatment, the patient has consented to  Procedure(s) with comments: ESOPHAGOGASTRODUODENOSCOPY (EGD) WITH PROPOFOL (N/A) - 240 as a surgical intervention.  The patient's history has been reviewed, patient examined, no change in status, stable for surgery.  I have reviewed the patient's chart and labs.  Questions were answered to the patient's satisfaction.     [name]

## 2022-02-04 LAB — SURGICAL PATHOLOGY

## 2022-02-08 ENCOUNTER — Encounter (HOSPITAL_COMMUNITY): Payer: Self-pay | Admitting: Gastroenterology

## 2022-04-06 ENCOUNTER — Ambulatory Visit (INDEPENDENT_AMBULATORY_CARE_PROVIDER_SITE_OTHER): Payer: No Typology Code available for payment source | Admitting: Gastroenterology

## 2022-04-06 ENCOUNTER — Encounter (INDEPENDENT_AMBULATORY_CARE_PROVIDER_SITE_OTHER): Payer: Self-pay | Admitting: Gastroenterology

## 2023-04-17 ENCOUNTER — Emergency Department (HOSPITAL_COMMUNITY): Payer: No Typology Code available for payment source

## 2023-04-17 ENCOUNTER — Other Ambulatory Visit: Payer: Self-pay

## 2023-04-17 ENCOUNTER — Inpatient Hospital Stay (HOSPITAL_COMMUNITY)
Admission: EM | Admit: 2023-04-17 | Discharge: 2023-04-29 | DRG: 304 | Disposition: A | Discharge status: Home / Self Care | Payer: No Typology Code available for payment source | Attending: Internal Medicine | Admitting: Internal Medicine

## 2023-04-17 ENCOUNTER — Encounter (HOSPITAL_COMMUNITY): Payer: Self-pay

## 2023-04-17 DIAGNOSIS — G4733 Obstructive sleep apnea (adult) (pediatric): Secondary | ICD-10-CM | POA: Diagnosis present

## 2023-04-17 DIAGNOSIS — E8721 Acute metabolic acidosis: Secondary | ICD-10-CM | POA: Diagnosis present

## 2023-04-17 DIAGNOSIS — E860 Dehydration: Secondary | ICD-10-CM | POA: Diagnosis present

## 2023-04-17 DIAGNOSIS — E1143 Type 2 diabetes mellitus with diabetic autonomic (poly)neuropathy: Secondary | ICD-10-CM | POA: Diagnosis present

## 2023-04-17 DIAGNOSIS — I161 Hypertensive emergency: Principal | ICD-10-CM | POA: Diagnosis present

## 2023-04-17 DIAGNOSIS — Z7984 Long term (current) use of oral hypoglycemic drugs: Secondary | ICD-10-CM

## 2023-04-17 DIAGNOSIS — D509 Iron deficiency anemia, unspecified: Secondary | ICD-10-CM | POA: Diagnosis present

## 2023-04-17 DIAGNOSIS — I482 Chronic atrial fibrillation, unspecified: Secondary | ICD-10-CM | POA: Diagnosis present

## 2023-04-17 DIAGNOSIS — Z23 Encounter for immunization: Secondary | ICD-10-CM

## 2023-04-17 DIAGNOSIS — Z5309 Procedure and treatment not carried out because of other contraindication: Secondary | ICD-10-CM | POA: Diagnosis not present

## 2023-04-17 DIAGNOSIS — K3184 Gastroparesis: Secondary | ICD-10-CM | POA: Diagnosis present

## 2023-04-17 DIAGNOSIS — Z5986 Financial insecurity: Secondary | ICD-10-CM

## 2023-04-17 DIAGNOSIS — R1110 Vomiting, unspecified: Secondary | ICD-10-CM | POA: Diagnosis not present

## 2023-04-17 DIAGNOSIS — E1165 Type 2 diabetes mellitus with hyperglycemia: Secondary | ICD-10-CM | POA: Diagnosis present

## 2023-04-17 DIAGNOSIS — F419 Anxiety disorder, unspecified: Secondary | ICD-10-CM | POA: Diagnosis present

## 2023-04-17 DIAGNOSIS — E1122 Type 2 diabetes mellitus with diabetic chronic kidney disease: Secondary | ICD-10-CM | POA: Diagnosis present

## 2023-04-17 DIAGNOSIS — N170 Acute kidney failure with tubular necrosis: Secondary | ICD-10-CM | POA: Diagnosis present

## 2023-04-17 DIAGNOSIS — Z8719 Personal history of other diseases of the digestive system: Secondary | ICD-10-CM

## 2023-04-17 DIAGNOSIS — N1832 Chronic kidney disease, stage 3b: Secondary | ICD-10-CM | POA: Diagnosis present

## 2023-04-17 DIAGNOSIS — Z6841 Body Mass Index (BMI) 40.0 and over, adult: Secondary | ICD-10-CM

## 2023-04-17 DIAGNOSIS — I480 Paroxysmal atrial fibrillation: Secondary | ICD-10-CM | POA: Diagnosis present

## 2023-04-17 DIAGNOSIS — I129 Hypertensive chronic kidney disease with stage 1 through stage 4 chronic kidney disease, or unspecified chronic kidney disease: Secondary | ICD-10-CM | POA: Diagnosis present

## 2023-04-17 DIAGNOSIS — Z91141 Patient's other noncompliance with medication regimen due to financial hardship: Secondary | ICD-10-CM

## 2023-04-17 DIAGNOSIS — T461X6A Underdosing of calcium-channel blockers, initial encounter: Secondary | ICD-10-CM | POA: Diagnosis present

## 2023-04-17 DIAGNOSIS — E119 Type 2 diabetes mellitus without complications: Secondary | ICD-10-CM | POA: Diagnosis not present

## 2023-04-17 DIAGNOSIS — N19 Unspecified kidney failure: Secondary | ICD-10-CM | POA: Diagnosis not present

## 2023-04-17 DIAGNOSIS — Z7901 Long term (current) use of anticoagulants: Secondary | ICD-10-CM

## 2023-04-17 DIAGNOSIS — K219 Gastro-esophageal reflux disease without esophagitis: Secondary | ICD-10-CM | POA: Diagnosis present

## 2023-04-17 DIAGNOSIS — Z9889 Other specified postprocedural states: Secondary | ICD-10-CM

## 2023-04-17 DIAGNOSIS — G8929 Other chronic pain: Secondary | ICD-10-CM | POA: Diagnosis present

## 2023-04-17 DIAGNOSIS — D631 Anemia in chronic kidney disease: Secondary | ICD-10-CM | POA: Diagnosis present

## 2023-04-17 DIAGNOSIS — Z87891 Personal history of nicotine dependence: Secondary | ICD-10-CM

## 2023-04-17 DIAGNOSIS — N179 Acute kidney failure, unspecified: Secondary | ICD-10-CM | POA: Diagnosis present

## 2023-04-17 DIAGNOSIS — N185 Chronic kidney disease, stage 5: Secondary | ICD-10-CM | POA: Diagnosis present

## 2023-04-17 DIAGNOSIS — E876 Hypokalemia: Secondary | ICD-10-CM | POA: Diagnosis not present

## 2023-04-17 DIAGNOSIS — R109 Unspecified abdominal pain: Secondary | ICD-10-CM

## 2023-04-17 DIAGNOSIS — Z79899 Other long term (current) drug therapy: Secondary | ICD-10-CM

## 2023-04-17 DIAGNOSIS — I4891 Unspecified atrial fibrillation: Secondary | ICD-10-CM | POA: Diagnosis present

## 2023-04-17 DIAGNOSIS — R1011 Right upper quadrant pain: Secondary | ICD-10-CM | POA: Diagnosis not present

## 2023-04-17 DIAGNOSIS — T447X6A Underdosing of beta-adrenoreceptor antagonists, initial encounter: Secondary | ICD-10-CM | POA: Diagnosis present

## 2023-04-17 DIAGNOSIS — R112 Nausea with vomiting, unspecified: Secondary | ICD-10-CM | POA: Diagnosis not present

## 2023-04-17 LAB — CBG MONITORING, ED
Glucose-Capillary: 215 mg/dL — ABNORMAL HIGH (ref 70–99)
Glucose-Capillary: 224 mg/dL — ABNORMAL HIGH (ref 70–99)

## 2023-04-17 LAB — URINALYSIS, ROUTINE W REFLEX MICROSCOPIC
Bacteria, UA: NONE SEEN
Bilirubin Urine: NEGATIVE
Glucose, UA: >=500 mg/dL — AB
Ketones, ur: 20 mg/dL — AB
Leukocytes,Ua: NEGATIVE
Nitrite: NEGATIVE
Protein, ur: >=300 mg/dL — AB
Specific Gravity, Urine: 1.015 (ref 1.005–1.030)
pH: 7 (ref 5.0–8.0)

## 2023-04-17 LAB — BLOOD GAS, VENOUS
Acid-base deficit: 7.3 mmol/L — ABNORMAL HIGH (ref 0.0–2.0)
Bicarbonate: 18.8 mmol/L — ABNORMAL LOW (ref 20.0–28.0)
Drawn by: 69862
O2 Saturation: 48.5 %
Patient temperature: 36.4
pCO2, Ven: 38 mm[Hg] — ABNORMAL LOW (ref 44–60)
pH, Ven: 7.3 (ref 7.25–7.43)
pO2, Ven: 31 mm[Hg] — CL (ref 32–45)

## 2023-04-17 LAB — COMPREHENSIVE METABOLIC PANEL
ALT: 22 U/L (ref 0–44)
AST: 24 U/L (ref 15–41)
Albumin: 1.8 g/dL — ABNORMAL LOW (ref 3.5–5.0)
Alkaline Phosphatase: 58 U/L (ref 38–126)
Anion gap: 12 (ref 5–15)
BUN: 23 mg/dL — ABNORMAL HIGH (ref 6–20)
CO2: 15 mmol/L — ABNORMAL LOW (ref 22–32)
Calcium: 8.7 mg/dL — ABNORMAL LOW (ref 8.9–10.3)
Chloride: 110 mmol/L (ref 98–111)
Creatinine, Ser: 3.71 mg/dL — ABNORMAL HIGH (ref 0.44–1.00)
GFR, Estimated: 15 mL/min — ABNORMAL LOW (ref >60)
Glucose, Bld: 241 mg/dL — ABNORMAL HIGH (ref 70–99)
Potassium: 3.5 mmol/L (ref 3.5–5.1)
Sodium: 137 mmol/L (ref 135–145)
Total Bilirubin: 0.5 mg/dL (ref 0.3–1.2)
Total Protein: 6.2 g/dL — ABNORMAL LOW (ref 6.5–8.1)

## 2023-04-17 LAB — CBC
HCT: 41.4 % (ref 36.0–46.0)
Hemoglobin: 12.2 g/dL (ref 12.0–15.0)
MCH: 23.3 pg — ABNORMAL LOW (ref 26.0–34.0)
MCHC: 29.5 g/dL — ABNORMAL LOW (ref 30.0–36.0)
MCV: 79 fL — ABNORMAL LOW (ref 80.0–100.0)
Platelets: 371 10*3/uL (ref 150–400)
RBC: 5.24 MIL/uL — ABNORMAL HIGH (ref 3.87–5.11)
RDW: 19.6 % — ABNORMAL HIGH (ref 11.5–15.5)
WBC: 9.6 10*3/uL (ref 4.0–10.5)
nRBC: 0 % (ref 0.0–0.2)

## 2023-04-17 LAB — PREGNANCY, URINE: Preg Test, Ur: NEGATIVE

## 2023-04-17 LAB — LIPASE, BLOOD: Lipase: 29 U/L (ref 11–51)

## 2023-04-17 MED ORDER — ONDANSETRON HCL 4 MG PO TABS
4.0000 mg | ORAL_TABLET | Freq: Four times a day (QID) | ORAL | Status: DC | PRN
  Administered 2023-04-17: 4 mg via ORAL
  Filled 2023-04-17: qty 1

## 2023-04-17 MED ORDER — SODIUM CHLORIDE 0.9 % IV BOLUS
1000.0000 mL | Freq: Once | INTRAVENOUS | Status: AC
  Administered 2023-04-17: 1000 mL via INTRAVENOUS

## 2023-04-17 MED ORDER — SODIUM CHLORIDE 0.9 % IV BOLUS: 500.0000 mL | Freq: Once | INTRAVENOUS | Status: DC

## 2023-04-17 MED ORDER — INSULIN ASPART 100 UNIT/ML IJ SOLN: 0.0000 [IU] | INTRAMUSCULAR | Status: DC

## 2023-04-17 MED ORDER — ONDANSETRON HCL 4 MG/2ML IJ SOLN
4.0000 mg | Freq: Once | INTRAMUSCULAR | Status: AC
  Administered 2023-04-17: 4 mg via INTRAVENOUS
  Filled 2023-04-17: qty 2

## 2023-04-17 MED ORDER — LABETALOL HCL 5 MG/ML IV SOLN
10.0000 mg | Freq: Once | INTRAVENOUS | Status: AC
  Administered 2023-04-17: 10 mg via INTRAVENOUS
  Filled 2023-04-17: qty 4

## 2023-04-17 MED ORDER — ONDANSETRON HCL 4 MG/2ML IJ SOLN
4.0000 mg | Freq: Four times a day (QID) | INTRAMUSCULAR | Status: DC | PRN
  Administered 2023-04-18: 4 mg via INTRAVENOUS
  Filled 2023-04-17: qty 2

## 2023-04-17 MED ORDER — METOCLOPRAMIDE HCL 5 MG/ML IJ SOLN
10.0000 mg | Freq: Once | INTRAMUSCULAR | Status: AC
  Administered 2023-04-17: 10 mg via INTRAVENOUS
  Filled 2023-04-17: qty 2

## 2023-04-17 MED ORDER — SODIUM CHLORIDE 0.9 % IV BOLUS: 1000.0000 mL | Freq: Once | INTRAVENOUS | Status: DC

## 2023-04-17 MED ORDER — NICARDIPINE HCL IN NACL 20-0.86 MG/200ML-% IV SOLN
3.0000 mg/h | INTRAVENOUS | Status: DC
  Administered 2023-04-17: 5 mg/h via INTRAVENOUS
  Filled 2023-04-17 (×2): qty 200

## 2023-04-17 MED ORDER — INSULIN ASPART 100 UNIT/ML IJ SOLN
0.0000 [IU] | Freq: Four times a day (QID) | INTRAMUSCULAR | Status: DC
  Administered 2023-04-17: 5 [IU] via SUBCUTANEOUS
  Administered 2023-04-18: 2 [IU] via SUBCUTANEOUS
  Administered 2023-04-18 – 2023-04-20 (×4): 3 [IU] via SUBCUTANEOUS
  Filled 2023-04-17 (×2): qty 1

## 2023-04-17 MED ORDER — HYDRALAZINE HCL 20 MG/ML IJ SOLN
10.0000 mg | Freq: Once | INTRAMUSCULAR | Status: AC
  Administered 2023-04-17: 10 mg via INTRAVENOUS
  Filled 2023-04-17: qty 1

## 2023-04-17 MED ORDER — HYDROMORPHONE HCL 1 MG/ML IJ SOLN
0.5000 mg | Freq: Once | INTRAMUSCULAR | Status: AC
  Administered 2023-04-17: 0.5 mg via INTRAVENOUS
  Filled 2023-04-17: qty 0.5

## 2023-04-17 MED ORDER — POTASSIUM CHLORIDE IN NACL 20-0.9 MEQ/L-% IV SOLN
INTRAVENOUS | Status: DC
  Filled 2023-04-17: qty 1000

## 2023-04-17 MED ORDER — POTASSIUM CHLORIDE IN NACL 20-0.9 MEQ/L-% IV SOLN: INTRAVENOUS | Status: DC

## 2023-04-17 NOTE — Assessment & Plan Note (Signed)
History of atrial fibrillation, supposed to be on anticoagulation with Eliquis.  Heart rate 97-135. -Resume Eliquis -Follow-up EKG

## 2023-04-17 NOTE — ED Provider Notes (Signed)
[place] EMERGENCY DEPARTMENT AT [facility] Provider Note   CSN: [id number] Arrival date & time: 04/17/23  1000     History  Chief Complaint  Patient presents with   Abdominal Pain    Melissa [name] is a 47 y.o. female.   Patient to ED with pain in the RUQ since yesterday. No fever. No diarrhea. She is having significant nausea and is unable to eat or drink anything x 3-4 days. History of the similar symptoms in the past with, per daughter, extensive work up including ultrasounds and EGD, reported negative by patient. No chest pain, SOB, cough, urinary symptoms.   The history is provided by the patient. No language interpreter was used.  Abdominal Pain      Home Medications Prior to Admission medications   Medication Sig Start Date End Date Taking? Authorizing Provider  amLODipine (NORVASC) 10 MG tablet Take 10 mg by mouth daily. 12/16/21   [provider]  atorvastatin (LIPITOR) 80 MG tablet Take 80 mg by mouth daily. 12/01/21   [provider]  citalopram (CELEXA) 20 MG tablet Take 20 mg by mouth daily. 05/07/19   [provider]  ELIQUIS 5 MG TABS tablet Take 5 mg by mouth 2 (two) times daily. 08/24/21   [provider]  ferrous sulfate 325 (65 FE) MG tablet Take 325 mg by mouth daily.    [provider]  glimepiride (AMARYL) 4 MG tablet Take 4 mg by mouth every morning. 08/24/21   [provider]  JENCYCLA 0.35 MG tablet Take 1 tablet by mouth daily. 11/25/21   [provider]  metoprolol succinate (TOPROL-XL) 25 MG 24 hr tablet Take 25 mg by mouth daily. 11/23/21   [provider]  pantoprazole (PROTONIX) 40 MG tablet Take 40 mg by mouth daily.    [provider]  pioglitazone (ACTOS) 15 MG tablet Take 15 mg by mouth daily.    [provider]  Vitamin D, Ergocalciferol, (DRISDOL) 1.25 MG (50000 UNIT) CAPS capsule Take 50,000 Units by mouth once a week. 12/14/21   [provider]      Allergies    Patient has no known allergies.    Review of Systems   Review of Systems  Gastrointestinal:  Positive for abdominal pain.    Physical Exam Updated Vital Signs BP (!) 215/102   Pulse (!) 126   Temp (!) 97.5 F (36.4 C) (Axillary)   Resp 16   Ht 5\' 5"  (1.651 m)   Wt 136.1 kg   LMP 04/03/2023   SpO2 98%   BMI 49.92 kg/m  Physical Exam Vitals and nursing note reviewed.  Constitutional:      Appearance: She is well-developed. She is obese.     Comments: Uncomfortable appearing.  Cardiovascular:     Rate and Rhythm: Regular rhythm. Tachycardia present.  Abdominal:     Palpations: Abdomen is soft.     Tenderness: There is abdominal tenderness in the right upper quadrant.  Neurological:     Mental Status: She is alert.     ED Results / Procedures / Treatments   Labs (all labs ordered are listed, but only abnormal results are displayed) Labs Reviewed  COMPREHENSIVE METABOLIC PANEL - Abnormal; Notable for the following components:      Result Value   CO2 15 (*)    Glucose, Bld 241 (*)    BUN 23 (*)    Creatinine, Ser 3.71 (*)    Calcium 8.7 (*)  Total Protein 6.2 (*)    Albumin 1.8 (*)    GFR, Estimated 15 (*)    All other components within normal limits  CBC - Abnormal; Notable for the following components:   RBC 5.24 (*)    MCV 79.0 (*)    MCH 23.3 (*)    MCHC 29.5 (*)    RDW 19.6 (*)    All other components within normal limits  URINALYSIS, ROUTINE W REFLEX MICROSCOPIC - Abnormal; Notable for the following components:   Glucose, UA >=500 (*)    Hgb urine dipstick SMALL (*)    Ketones, ur 20 (*)    Protein, ur >=300 (*)    All other components within normal limits  BLOOD GAS, VENOUS - Abnormal; Notable for the following components:   pCO2, Ven 38 (*)    pO2, Ven 31 (*)    Bicarbonate 18.8 (*)    Acid-base deficit 7.3 (*)    All other components within normal limits  CBG MONITORING, ED - Abnormal; Notable for the  following components:   Glucose-Capillary 215 (*)    All other components within normal limits  LIPASE, BLOOD  PREGNANCY, URINE  HEMOGLOBIN A1C   Results for orders placed or performed during the hospital encounter of 04/17/23 (from the past 24 hour(s))  Urinalysis, Routine w reflex microscopic -Urine, Clean Catch     Status: Abnormal   Collection Time: 04/17/23 12:02 PM  Result Value Ref Range   Color, Urine YELLOW YELLOW   APPearance CLEAR CLEAR   Specific Gravity, Urine 1.015 1.005 - 1.030   pH 7.0 5.0 - 8.0   Glucose, UA >=500 (A) NEGATIVE mg/dL   Hgb urine dipstick SMALL (A) NEGATIVE   Bilirubin Urine NEGATIVE NEGATIVE   Ketones, ur 20 (A) NEGATIVE mg/dL   Protein, ur >=332 (A) NEGATIVE mg/dL   Nitrite NEGATIVE NEGATIVE   Leukocytes,Ua NEGATIVE NEGATIVE   RBC / HPF 0-5 0 - 5 RBC/hpf   WBC, UA 0-5 0 - 5 WBC/hpf   Bacteria, UA NONE SEEN NONE SEEN   Squamous Epithelial / HPF 0-5 0 - 5 /HPF   Mucus PRESENT   Pregnancy, urine     Status: None   Collection Time: 04/17/23 12:02 PM  Result Value Ref Range   Preg Test, Ur NEGATIVE NEGATIVE  Lipase, blood     Status: None   Collection Time: 04/17/23  1:21 PM  Result Value Ref Range   Lipase 29 11 - 51 U/L  Comprehensive metabolic panel     Status: Abnormal   Collection Time: 04/17/23  1:21 PM  Result Value Ref Range   Sodium 137 135 - 145 mmol/L   Potassium 3.5 3.5 - 5.1 mmol/L   Chloride 110 98 - 111 mmol/L   CO2 15 (L) 22 - 32 mmol/L   Glucose, Bld 241 (H) 70 - 99 mg/dL   BUN 23 (H) 6 - 20 mg/dL   Creatinine, Ser 9.51 (H) 0.44 - 1.00 mg/dL   Calcium 8.7 (L) 8.9 - 10.3 mg/dL   Total Protein 6.2 (L) 6.5 - 8.1 g/dL   Albumin 1.8 (L) 3.5 - 5.0 g/dL   AST 24 15 - 41 U/L   ALT 22 0 - 44 U/L   Alkaline Phosphatase 58 38 - 126 U/L   Total Bilirubin 0.5 0.3 - 1.2 mg/dL   GFR, Estimated 15 (L) >60 mL/min   Anion gap 12 5 - 15  CBC     Status: Abnormal   Collection  Time: 04/17/23  1:21 PM  Result Value Ref Range   WBC 9.6  4.0 - 10.5 K/uL   RBC 5.24 (H) 3.87 - 5.11 MIL/uL   Hemoglobin 12.2 12.0 - 15.0 g/dL   HCT 24.4 01.0 - 27.2 %   MCV 79.0 (L) 80.0 - 100.0 fL   MCH 23.3 (L) 26.0 - 34.0 pg   MCHC 29.5 (L) 30.0 - 36.0 g/dL   RDW 53.6 (H) 64.4 - 03.4 %   Platelets 371 150 - 400 K/uL   nRBC 0.0 0.0 - 0.2 %  Blood gas, venous (at [facility] and [facility])     Status: Abnormal   Collection Time: 04/17/23  3:11 PM  Result Value Ref Range   pH, Ven 7.3 7.25 - 7.43   pCO2, Ven 38 (L) 44 - 60 mmHg   pO2, Ven 31 (LL) 32 - 45 mmHg   Bicarbonate 18.8 (L) 20.0 - 28.0 mmol/L   Acid-base deficit 7.3 (H) 0.0 - 2.0 mmol/L   O2 Saturation 48.5 %   Patient temperature 36.4    Collection site BLOOD RIGHT HAND    Drawn by [id number]   CBG monitoring, ED     Status: Abnormal   Collection Time: 04/17/23  4:29 PM  Result Value Ref Range   Glucose-Capillary 215 (H) 70 - 99 mg/dL     EKG None  Radiology US Abdomen Limited RUQ (LIVER/GB)  Result Date: 04/17/2023 CLINICAL DATA:  151470 RUQ abdominal pain 151470.  Nausea/vomiting. EXAM: ULTRASOUND ABDOMEN LIMITED RIGHT UPPER QUADRANT COMPARISON:  None Available. FINDINGS: Gallbladder: No gallstones or wall thickening visualized. No sonographic Murphy sign noted by sonographer. Common bile duct: Diameter: Up to 4 mm.  No intrahepatic bile duct dilation. Liver: No focal lesion identified. Within normal limits in parenchymal echogenicity. Portal vein is patent on color Doppler imaging with normal direction of blood flow towards the liver. Other: None. IMPRESSION: 1. Normal right upper quadrant sonogram. Electronically Signed   By: Jules Schick M.D.   On: 04/17/2023 14:07    Procedures .Critical Care  Performed by: Elpidio Anis, PA-C Authorized by: Elpidio Anis, PA-C   Critical care provider statement:    Critical care time (minutes):  30   Critical care was necessary to treat or prevent imminent or life-threatening deterioration of the following conditions:  Renal failure  (Hypertensive emergency)   Critical care was time spent personally by me on the following activities:  Development of treatment plan with patient or surrogate, discussions with consultants, evaluation of patient's response to treatment, examination of patient, ordering and review of laboratory studies, ordering and review of radiographic studies, ordering and performing treatments and interventions, pulse oximetry, re-evaluation of patient's condition and review of old charts   Care discussed with: admitting provider       Medications Ordered in ED Medications  nicardipine (CARDENE) 20mg  in 0.86% saline IV infusion (0.1 mg/ml) (10 mg/hr Intravenous Rate/Dose Change 04/17/23 1836)  ondansetron (ZOFRAN) injection 4 mg (4 mg Intravenous Given 04/17/23 1213)  HYDROmorphone (DILAUDID) injection 0.5 mg (0.5 mg Intravenous Given 04/17/23 1158)  sodium chloride 0.9 % bolus 1,000 mL (0 mLs Intravenous Stopped 04/17/23 1829)  ondansetron (ZOFRAN) injection 4 mg (4 mg Intravenous Given 04/17/23 1529)  labetalol (NORMODYNE) injection 10 mg (10 mg Intravenous Given 04/17/23 1618)  hydrALAZINE (APRESOLINE) injection 10 mg (10 mg Intravenous Given 04/17/23 1723)  metoCLOPramide (REGLAN) injection 10 mg (10 mg Intravenous Given 04/17/23 1807)    ED Course/ Medical Decision Making/ A&P Clinical Course  as of 04/17/23 1841  Sun Apr 17, 2023  1532 pO2, Ven(!!): 31 [SG]  1702 Patient given IV labetalol for elevated blood pressure with no improvement. IV Hydralazine ordered. Will monitor for improvement. Plan on IV drip if no better and consider admission to ICU for hypertensive urgency. [SU]  1836 Blood pressure not improved with hydralazine. Cardene drip started. Reglan ordered for nausea. Now suspect renal dysfunction due to hypertensive emergency.  [SU]    Clinical Course User Index [SG] Sloan Leiter, DO [SU] Elpidio Anis, PA-C                                 Medical Decision Making This  patient presents to the ED for concern of abdominal pain and nausea, this involves an extensive number of treatment options, and is a complaint that carries with it a high risk of complications and morbidity.  The differential diagnosis includes cholecystitis, cholelithiasis, PUD, SBO/LBO,    Co morbidities that complicate the patient evaluation  Obesity, HTN, medication noncompliance,   Additional history obtained:  Additional history obtained from patient and sister    Lab Tests:  I Ordered, and personally interpreted labs.  The pertinent results include:   CBC essentially stable Cr elevated at 3.71 c/w AKI Glucose of 241, normal pH of 7.3, mildly decreased CO2 of 31, normal anion gap. Doubt DKA    Imaging Studies ordered:  I ordered imaging studies including RUQ ultrasound  I independently visualized and interpreted imaging which showed negative for cholecystitis or cholelithiasis I agree with the radiologist interpretation   Cardiac Monitoring: / EKG:    Problem List / ED Course / Critical interventions / Medication management  AKI - IV fluids being administered. Will monitor CBG Abdominal pain - pain management provided Recheck blood pressure significantly elevated at 255/134. Has not taken her medications in over 1 week (Amlodipine, metoprolol). IV Labetalol provided. Will recheck.   I ordered medication including dilaudid and zofran  for pain and nausea  Reevaluation of the patient after these medicines showed that the patient improved    Social Determinants of Health:  Medication noncompliance due to financial constraints   Test / Admission - Considered:  She will be admitted for AKI, rehydration Blood pressure significantly elevated on recheck - noncompliant. Plan: IV labetalol and recheck.  See Work up notations  Patient admitted for hypertensive emergency; hospitalist accepting admission.      Amount and/or Complexity of Data Reviewed Labs:  ordered. Decision-making details documented in ED Course. Radiology: ordered.  Risk Prescription drug management.           Final Clinical Impression(s) / ED Diagnoses Final diagnoses:  Hypertensive emergency  AKI (acute kidney injury) (HCC)  Recurrent abdominal pain    Rx / DC Orders ED Discharge Orders     None         Elpidio Anis, PA-C 04/17/23 1841    Sloan Leiter, DO 04/19/23 [phone]

## 2023-04-17 NOTE — Assessment & Plan Note (Addendum)
Cr elevated at 3.7, last checked a year ago was 1.4.  AKI in the setting of hypertensive emergency, and dehydration from vomiting and poor oral intake over the past few days. -Hydrate as above

## 2023-04-17 NOTE — H&P (Signed)
History and Physical    Melissa [name] UXL:[id number] DOB: [birth year] DOA: 04/17/2023  PCP: Shelby Dubin, FNP   Patient coming from: Home  I have personally briefly reviewed patient's old medical records in [facility]  Chief Complaint: Vomiting, Abdominal Pain  HPI: Melissa [name] is a 47 y.o. female with medical history significant for DM, HTN.  Patient presented to the ED with complaints of nausea for the past 2 weeks, acute worsening of abdominal pain visited yesterday, and since onset last night.  Abdominal pain is right upper quadrant.  She reports abdominal pain started when she was on Ozempic, which was discontinued about 2 years ago. She has had a couple more episodes of recurrence of same abdominal pain.  She reports she has had extensive workup for this all unrevealing, including EGD 01/2022 which was unremarkable. She occasionally uses marijuana, she tells me this helps with her symptoms. She has not taken her blood pressure medication in at least 2 weeks, when she ran out.  She reports she has had financial challenges and can't afford her medications. No headache, no chest pain, no difficulty breathing.    ED Course: Temperature 97.6.  Heart rate 96-135.  Respirate rate 13-26.  Blood pressure systolic elevated to 264/135.  No improvement in blood pressure with labetalol 10 mg and hydralazine 10 mg.  Started on Cardizem drip with improvement in blood pressure. Right upper quadrant ultrasound negative for acute abnormality. Hospitalist to admit for hypertensive urgency.  Review of Systems: As per HPI all other systems reviewed and negative.  Past Medical History:  Diagnosis Date   Anemia    Anxiety    Complication of anesthesia    Diabetes mellitus without complication (HCC)    Dysrhythmia    GERD (gastroesophageal reflux disease)    History of hiatal hernia    Hypertension    PONV (postoperative nausea and vomiting)     Past Surgical History:  Procedure  Laterality Date   BIOPSY  02/02/2022   Procedure: BIOPSY;  Surgeon: Dolores Frame, MD;  Location: [facility];  Service: Gastroenterology;;   ESOPHAGOGASTRODUODENOSCOPY (EGD) WITH PROPOFOL N/A 02/02/2022   Procedure: ESOPHAGOGASTRODUODENOSCOPY (EGD) WITH PROPOFOL;  Surgeon: Dolores Frame, MD;  Location: [facility];  Service: Gastroenterology;  Laterality: N/A;  240   NO PAST SURGERIES       reports that she has quit smoking. Her smoking use included cigarettes. She has been exposed to tobacco smoke. She has quit using smokeless tobacco. She reports current alcohol use. She reports that she does not use drugs.  No Known Allergies  History reviewed. No pertinent family history.  Prior to Admission medications   Medication Sig Start Date End Date Taking? Authorizing Provider  amLODipine (NORVASC) 10 MG tablet Take 10 mg by mouth daily. 12/16/21   [provider]  atorvastatin (LIPITOR) 80 MG tablet Take 80 mg by mouth daily. 12/01/21   [provider]  citalopram (CELEXA) 20 MG tablet Take 20 mg by mouth daily. 05/07/19   [provider]  ELIQUIS 5 MG TABS tablet Take 5 mg by mouth 2 (two) times daily. 08/24/21   [provider]  ferrous sulfate 325 (65 FE) MG tablet Take 325 mg by mouth daily.    [provider]  glimepiride (AMARYL) 4 MG tablet Take 4 mg by mouth every morning. 08/24/21   [provider]  JENCYCLA 0.35 MG tablet Take 1 tablet by mouth daily. 11/25/21   [provider]  metoprolol succinate (TOPROL-XL) 25 MG 24 hr tablet Take 25 mg by mouth daily. 11/23/21   [provider]  pantoprazole (PROTONIX) 40 MG tablet Take 40 mg by mouth daily.    [provider]  pioglitazone (ACTOS) 15 MG tablet Take 15 mg by mouth daily.    [provider]  Vitamin D, Ergocalciferol, (DRISDOL) 1.25 MG (50000 UNIT) CAPS capsule Take 50,000 Units by mouth once a week. 12/14/21   [provider]    Physical Exam: Vitals:   04/17/23 1645 04/17/23 1700 04/17/23 1726 04/17/23 1816  BP:  (!) 264/135 (!) 236/149 (!) 223/125  Pulse: 99 98 (!) 107 (!) 117  Resp:    18  Temp:    (!) 97.5 F (36.4 C)  TempSrc:    Axillary  SpO2: 95% 97% 94% 98%  Weight:      Height:        Constitutional: NAD, calm, comfortable Vitals:   04/17/23 1645 04/17/23 1700 04/17/23 1726 04/17/23 1816  BP:  (!) 264/135 (!) 236/149 (!) 223/125  Pulse: 99 98 (!) 107 (!) 117  Resp:    18  Temp:    (!) 97.5 F (36.4 C)  TempSrc:    Axillary  SpO2: 95% 97% 94% 98%  Weight:      Height:       Eyes: PERRL, lids and conjunctivae normal ENMT: Mucous membranes are dry  Neck: normal, supple, no masses, no thyromegaly Respiratory: clear to auscultation bilaterally, no wheezing, no crackles. Normal respiratory effort.  Cardiovascular: Tachycardic, regular rate and rhythm, no murmurs / rubs / gallops. No extremity edema.  Extremities warm.   Abdomen: no tenderness, obese, no masses palpated. No hepatosplenomegaly.   Musculoskeletal: no clubbing / cyanosis. No joint deformity upper and lower extremities.  Skin: no rashes, lesions, ulcers. No induration Neurologic: no facial asymmetry, moving extremities spontaneously.  Speech fluent Psychiatric: Normal judgment and insight. Alert and oriented x 3. Normal mood.   Labs on Admission: I have personally reviewed following labs and imaging studies  CBC: Recent Labs  Lab 04/17/23 1321  WBC 9.6  HGB 12.2  HCT 41.4  MCV 79.0*  PLT 371   Basic Metabolic Panel: Recent Labs  Lab 04/17/23 1321  NA 137  K 3.5  CL 110  CO2 15*  GLUCOSE 241*  BUN 23*  CREATININE 3.71*  CALCIUM 8.7*   GFR: Estimated Creatinine Clearance: 26.2 mL/min (A) (by C-G formula based on SCr of 3.71 mg/dL (H)). Liver Function Tests: Recent Labs  Lab 04/17/23 1321  AST 24  ALT 22  ALKPHOS 58  BILITOT 0.5  PROT 6.2*  ALBUMIN 1.8*   Recent Labs  Lab  04/17/23 1321  LIPASE 29   CBG: Recent Labs  Lab 04/17/23 1629  GLUCAP 215*   Urine analysis:    Component Value Date/Time   COLORURINE YELLOW 04/17/2023 1202   APPEARANCEUR CLEAR 04/17/2023 1202   LABSPEC 1.015 04/17/2023 1202   PHURINE 7.0 04/17/2023 1202   GLUCOSEU >=500 (A) 04/17/2023 1202   HGBUR SMALL (A) 04/17/2023 1202   BILIRUBINUR NEGATIVE 04/17/2023 1202   KETONESUR 20 (A) 04/17/2023 1202   PROTEINUR >=300 (A) 04/17/2023 1202   NITRITE NEGATIVE 04/17/2023 1202   LEUKOCYTESUR NEGATIVE 04/17/2023 1202    Radiological Exams on Admission: US Abdomen Limited RUQ (LIVER/GB)  Result Date: 04/17/2023 CLINICAL DATA:  151470 RUQ abdominal pain 151470.  Nausea/vomiting. EXAM: ULTRASOUND ABDOMEN LIMITED RIGHT UPPER QUADRANT COMPARISON:  None Available. FINDINGS:  Gallbladder: No gallstones or wall thickening visualized. No sonographic Murphy sign noted by sonographer. Common bile duct: Diameter: Up to 4 mm.  No intrahepatic bile duct dilation. Liver: No focal lesion identified. Within normal limits in parenchymal echogenicity. Portal vein is patent on color Doppler imaging with normal direction of blood flow towards the liver. Other: None. IMPRESSION: 1. Normal right upper quadrant sonogram. Electronically Signed   By: Jules Schick M.D.   On: 04/17/2023 14:07    EKG: Pending.   Assessment/Plan Principal Problem:   Hypertensive emergency Active Problems:   AKI (acute kidney injury) (HCC)   Intractable vomiting   DM (diabetes mellitus) (HCC)  Assessment and Plan: * Hypertensive emergency Elevated blood pressure complicated by AKI.  Blood pressure elevated 264/135, no response to IV labetalol and IV hydralazine 10 mg each.  Is not taking any of her antihypertensives in at least 2 weeks-due to financial challenges.  She is supposed to be on Norvasc 10 mg, metoprolol 25 mg daily. -Hold home antihypertensives for now as she is vomiting -Continue Cardizem drip started in ED -  Goal blood pressure 160s to 180s for now -TOC consult  Intractable vomiting Multiple episodes of vomiting overnight, with acute on chronic right upper quadrant abdominal pain.  Liver enzymes WNL. RUQ [place]- normal.  EGD- 8/23-unremarkable.  Per Care Everywhere - last CT 10/2021 -  no acute abnormality.  Reports occasional marijuana use, also diabetic.  Differentials include- gastroparesis, marijuana induced cyclical vomiting. -As she is afebrile without leukocytosis, abdominal exam benign, will hold off on further imaging -1 L bolus, continue N/s + 20 KCL 100cc/hr x 15hrs -Check QTc on EKG -Zofran as needed - UDS  AKI (acute kidney injury) (HCC) Cr elevated at 3.7, last checked a year ago was 1.4.  AKI in the setting of hypertensive emergency, and dehydration from vomiting and poor oral intake over the past few days. -Hydrate as above  Atrial fibrillation (HCC) History of atrial fibrillation, supposed to be on anticoagulation with Eliquis.  Heart rate 97-135. -Resume Eliquis -Follow-up EKG  DM (diabetes mellitus) (HCC) - SSI- M -Hold home glipizide and Actos - HgbA1c   DVT prophylaxis: Heparin Code Status: FULL code Family Communication: Sister- Quatisha at bedside Disposition Plan: ~ 2 days Consults called: None  Admission status: Inpt Stepdown I certify that at the point of admission it is my clinical judgment that the patient will require inpatient hospital care spanning beyond 2 midnights from the point of admission due to high intensity of service, high risk for further deterioration and high frequency of surveillance required.    Author: Onnie Boer, MD 04/17/2023 10:34 PM  For on call review [email or web address].

## 2023-04-17 NOTE — ED Triage Notes (Signed)
C/o RUQ abd pain with n/v and generalized abd swelling/bloating x1 day.  Patient reports seeing GI with EGD x 2 months ago.  Hx of DM.  Pt is on eliquis and denies blood in stool or emesis.

## 2023-04-17 NOTE — Assessment & Plan Note (Signed)
Elevated blood pressure complicated by AKI.  Blood pressure elevated 264/135, no response to IV labetalol and IV hydralazine 10 mg each.  Is not taking any of her antihypertensives in at least 2 weeks-due to financial challenges.  She is supposed to be on Norvasc 10 mg, metoprolol 25 mg daily. -Hold home antihypertensives for now as she is vomiting -Continue Cardizem drip started in ED - Goal blood pressure 160s to 180s for now -[facility] consult

## 2023-04-17 NOTE — Assessment & Plan Note (Addendum)
Multiple episodes of vomiting overnight, with acute on chronic right upper quadrant abdominal pain.  Liver enzymes WNL. RUQ [place]- normal.  EGD- 8/23-unremarkable.  Per Care Everywhere - last CT 10/2021 -  no acute abnormality.  Reports occasional marijuana use, also diabetic.  Differentials include- gastroparesis, marijuana induced cyclical vomiting. -As she is afebrile without leukocytosis, abdominal exam benign, will hold off on further imaging -1 L bolus, continue N/s + 20 KCL 100cc/hr x 15hrs -Check QTc on EKG -Zofran as needed - UDS

## 2023-04-17 NOTE — ED Notes (Signed)
Iv noted to have redness around site and pt complaining of pain. IV site cold to the touch. Attempting to establish new IV site

## 2023-04-17 NOTE — Assessment & Plan Note (Signed)
-   SSI- M -Hold home glipizide and Actos - HgbA1c

## 2023-04-18 DIAGNOSIS — I161 Hypertensive emergency: Secondary | ICD-10-CM | POA: Diagnosis not present

## 2023-04-18 LAB — CBC
HCT: 30.6 % — ABNORMAL LOW (ref 36.0–46.0)
Hemoglobin: 9.5 g/dL — ABNORMAL LOW (ref 12.0–15.0)
MCH: 24.1 pg — ABNORMAL LOW (ref 26.0–34.0)
MCHC: 31 g/dL (ref 30.0–36.0)
MCV: 77.7 fL — ABNORMAL LOW (ref 80.0–100.0)
Platelets: 340 10*3/uL (ref 150–400)
RBC: 3.94 MIL/uL (ref 3.87–5.11)
RDW: 19.4 % — ABNORMAL HIGH (ref 11.5–15.5)
WBC: 8.9 10*3/uL (ref 4.0–10.5)
nRBC: 0 % (ref 0.0–0.2)

## 2023-04-18 LAB — IRON AND TIBC
Iron: 29 ug/dL (ref 28–170)
Saturation Ratios: 15 % (ref 10.4–31.8)
TIBC: 196 ug/dL — ABNORMAL LOW (ref 250–450)
UIBC: 167 ug/dL

## 2023-04-18 LAB — BASIC METABOLIC PANEL
Anion gap: 5 (ref 5–15)
BUN: 23 mg/dL — ABNORMAL HIGH (ref 6–20)
CO2: 18 mmol/L — ABNORMAL LOW (ref 22–32)
Calcium: 7.9 mg/dL — ABNORMAL LOW (ref 8.9–10.3)
Chloride: 115 mmol/L — ABNORMAL HIGH (ref 98–111)
Creatinine, Ser: 3.84 mg/dL — ABNORMAL HIGH (ref 0.44–1.00)
GFR, Estimated: 14 mL/min — ABNORMAL LOW (ref >60)
Glucose, Bld: 96 mg/dL (ref 70–99)
Potassium: 3 mmol/L — ABNORMAL LOW (ref 3.5–5.1)
Sodium: 138 mmol/L (ref 135–145)

## 2023-04-18 LAB — GLUCOSE, CAPILLARY
Glucose-Capillary: 106 mg/dL — ABNORMAL HIGH (ref 70–99)
Glucose-Capillary: 132 mg/dL — ABNORMAL HIGH (ref 70–99)
Glucose-Capillary: 199 mg/dL — ABNORMAL HIGH (ref 70–99)
Glucose-Capillary: 172 mg/dL — ABNORMAL HIGH (ref 70–99)

## 2023-04-18 LAB — RETICULOCYTES
Immature Retic Fract: 39.7 % — ABNORMAL HIGH (ref 2.3–15.9)
RBC.: 4.03 MIL/uL (ref 3.87–5.11)
Retic Count, Absolute: 93.5 10*3/uL (ref 19.0–186.0)
Retic Ct Pct: 2.3 % (ref 0.4–3.1)

## 2023-04-18 LAB — RAPID URINE DRUG SCREEN, HOSP PERFORMED
Amphetamines: NOT DETECTED
Barbiturates: NOT DETECTED
Benzodiazepines: NOT DETECTED
Cocaine: NOT DETECTED
Opiates: NOT DETECTED
Tetrahydrocannabinol: POSITIVE — AB

## 2023-04-18 LAB — HEMOGLOBIN A1C
Hgb A1c MFr Bld: 8.5 % — ABNORMAL HIGH (ref 4.8–5.6)
Mean Plasma Glucose: 197.25 mg/dL

## 2023-04-18 LAB — VITAMIN B12: Vitamin B-12: 1021 pg/mL — ABNORMAL HIGH (ref 180–914)

## 2023-04-18 LAB — FOLATE: Folate: 7.5 ng/mL (ref >5.9)

## 2023-04-18 LAB — CBG MONITORING, ED
Glucose-Capillary: 141 mg/dL — ABNORMAL HIGH (ref 70–99)
Glucose-Capillary: 170 mg/dL — ABNORMAL HIGH (ref 70–99)

## 2023-04-18 LAB — HIV ANTIBODY (ROUTINE TESTING W REFLEX): HIV Screen 4th Generation wRfx: NONREACTIVE

## 2023-04-18 LAB — FERRITIN: Ferritin: 7 ng/mL — ABNORMAL LOW (ref 11–307)

## 2023-04-18 MED ORDER — LABETALOL HCL 5 MG/ML IV SOLN
5.0000 mg | Freq: Four times a day (QID) | INTRAVENOUS | Status: DC | PRN
  Administered 2023-04-22 – 2023-04-29 (×4): 5 mg via INTRAVENOUS
  Filled 2023-04-18 (×4): qty 4

## 2023-04-18 MED ORDER — AMLODIPINE BESYLATE 10 MG PO TABS
10.0000 mg | ORAL_TABLET | Freq: Every day | ORAL | Status: DC
  Administered 2023-04-18 – 2023-04-29 (×12): 10 mg via ORAL
  Filled 2023-04-18 (×2): qty 2
  Filled 2023-04-18 (×2): qty 1
  Filled 2023-04-18 (×8): qty 2

## 2023-04-18 MED ORDER — APIXABAN 2.5 MG PO TABS: 2.5000 mg | ORAL_TABLET | Freq: Two times a day (BID) | ORAL | Status: DC

## 2023-04-18 MED ORDER — FERROUS SULFATE 325 (65 FE) MG PO TABS
325.0000 mg | ORAL_TABLET | Freq: Every day | ORAL | Status: DC
  Administered 2023-04-18 – 2023-04-20 (×3): 325 mg via ORAL
  Filled 2023-04-18 (×3): qty 1

## 2023-04-18 MED ORDER — CITALOPRAM HYDROBROMIDE 20 MG PO TABS
20.0000 mg | ORAL_TABLET | Freq: Every day | ORAL | Status: DC
  Administered 2023-04-18 – 2023-04-29 (×12): 20 mg via ORAL
  Filled 2023-04-18 (×12): qty 1

## 2023-04-18 MED ORDER — ACETAMINOPHEN 650 MG RE SUPP
650.0000 mg | Freq: Four times a day (QID) | RECTAL | Status: DC | PRN
Start: 2023-04-18 — End: 2023-04-29

## 2023-04-18 MED ORDER — APIXABAN 5 MG PO TABS
5.0000 mg | ORAL_TABLET | Freq: Two times a day (BID) | ORAL | Status: DC
  Administered 2023-04-18 – 2023-04-21 (×7): 5 mg via ORAL
  Filled 2023-04-18 (×7): qty 1

## 2023-04-18 MED ORDER — METOPROLOL SUCCINATE ER 25 MG PO TB24
25.0000 mg | ORAL_TABLET | Freq: Every day | ORAL | Status: DC
  Administered 2023-04-18 – 2023-04-25 (×8): 25 mg via ORAL
  Filled 2023-04-18 (×8): qty 1

## 2023-04-18 MED ORDER — POLYETHYLENE GLYCOL 3350 17 G PO PACK
17.0000 g | PACK | Freq: Every day | ORAL | Status: DC | PRN
  Administered 2023-04-26: 17 g via ORAL
  Filled 2023-04-18: qty 1

## 2023-04-18 MED ORDER — POTASSIUM CHLORIDE IN NACL 20-0.9 MEQ/L-% IV SOLN: INTRAVENOUS | Status: AC

## 2023-04-18 MED ORDER — METOCLOPRAMIDE HCL 5 MG/ML IJ SOLN
10.0000 mg | Freq: Four times a day (QID) | INTRAMUSCULAR | Status: DC | PRN
  Administered 2023-04-18: 10 mg via INTRAVENOUS
  Filled 2023-04-18: qty 2

## 2023-04-18 MED ORDER — POTASSIUM CHLORIDE CRYS ER 20 MEQ PO TBCR: 40.0000 meq | EXTENDED_RELEASE_TABLET | Freq: Once | ORAL | Status: DC

## 2023-04-18 MED ORDER — ACETAMINOPHEN 325 MG PO TABS
650.0000 mg | ORAL_TABLET | Freq: Four times a day (QID) | ORAL | Status: DC | PRN
  Administered 2023-04-25 – 2023-04-29 (×8): 650 mg via ORAL
  Filled 2023-04-18 (×9): qty 2

## 2023-04-18 MED ORDER — METOCLOPRAMIDE HCL 5 MG/ML IJ SOLN
INTRAMUSCULAR | Status: AC
  Administered 2023-04-18: 10 mg via INTRAVENOUS
  Filled 2023-04-18: qty 2

## 2023-04-18 MED ORDER — PANTOPRAZOLE SODIUM 40 MG PO TBEC
40.0000 mg | DELAYED_RELEASE_TABLET | Freq: Every day | ORAL | Status: DC
  Administered 2023-04-18 – 2023-04-29 (×12): 40 mg via ORAL
  Filled 2023-04-18 (×12): qty 1

## 2023-04-18 MED ORDER — APIXABAN 5 MG PO TABS: 5.0000 mg | ORAL_TABLET | Freq: Two times a day (BID) | ORAL | Status: DC

## 2023-04-18 NOTE — ED Notes (Signed)
Cardene drip has been paused

## 2023-04-18 NOTE — Progress Notes (Addendum)
   04/18/23 1322  TOC Brief Assessment  Insurance and Status Reviewed  Patient has primary care physician Yes  Home environment has been reviewed from home  Prior level of function: Independent  Prior/Current Home Services No current home services  Social Determinants of Health Reivew SDOH reviewed no interventions necessary  Readmission risk has been reviewed Yes  Transition of care needs no transition of care needs at this time    Transition of Care Department [facility]) has reviewed patient and no TOC needs have been identified at this time. We will continue to monitor patient advancement through interdisciplinary progression rounds. If new patient transition needs arise, please place a TOC consult.  CSW updated by MD that pt was unable to get her meds recent;ly. CSW spoke with pt at bedside, she states she has insurance she just had some other financial issues and she was unable to get them.

## 2023-04-18 NOTE — Progress Notes (Signed)
PROGRESS NOTE    Melissa [name]  YQI:[id number] DOB: 07-17-1975 DOA: 04/17/2023 PCP: Melissa Dubin, FNP   Brief Narrative:     Melissa [name] is a 47 y.o. female with medical history significant for DM, HTN.  Patient presented to the ED with complaints of nausea for the past 2 weeks, acute worsening of abdominal pain visited yesterday, and since onset last night.  Patient was admitted for hypertensive emergency in the setting of intractable nausea and vomiting.  She is also noted to have AKI.  Blood pressures have improved as well as the nausea and vomiting and diet will be advanced.  Assessment & Plan:   Principal Problem:   Hypertensive emergency Active Problems:   AKI (acute kidney injury) (HCC)   Intractable vomiting   DM (diabetes mellitus) (HCC)   Atrial fibrillation (HCC)   Morbid obesity with body mass index (BMI) of 40.0 to 49.9 (HCC)  Assessment and Plan:  Hypertensive emergency-resolved Elevated blood pressure complicated by AKI.  Blood pressure elevated 264/135, no response to IV labetalol and IV hydralazine 10 mg each.  Is not taking any of her antihypertensives in at least 2 weeks-due to financial challenges.  She is supposed to be on Norvasc 10 mg, metoprolol 25 mg daily. -Cardene drip was held as blood pressures improved -Resume home oral agents and monitor carefully   Intractable vomiting-resolved Multiple episodes of vomiting overnight, with acute on chronic right upper quadrant abdominal pain.  Liver enzymes WNL. RUQ [place]- normal.  EGD- 8/23-unremarkable.  Per Care Everywhere - last CT 10/2021 -  no acute abnormality.  Reports occasional marijuana use, also diabetic.  Differentials include- gastroparesis, marijuana induced cyclical vomiting. -Might have cannabis hyperemesis, but she claims that this helps her with her nausea and vomiting -Advance to soft diet   AKI (acute kidney injury) (HCC) Cr elevated at 3.7, last checked a year ago was 1.4.  AKI in the setting  of hypertensive emergency, and dehydration from vomiting and poor oral intake over the past few days. -Hydrate as above  Anemia -Continue to monitor, anemia panel without any significant abnormalities -Check CBC in a.m.  Hypokalemia likely secondary to GI losses -Replete IV and recheck in a.m.   Atrial fibrillation (HCC) History of atrial fibrillation, supposed to be on anticoagulation with Eliquis.  Heart rate 97-135. -Resume Eliquis -Follow-up EKG   DM (diabetes mellitus) (HCC) - SSI- M -Hold home glipizide and Actos - HgbA1c  Morbid obesity BMI 49.92    DVT prophylaxis:Eliquis Code Status: Full Family Communication: None at bedside Disposition Plan:  Status is: Inpatient Remains inpatient appropriate because: Need for IV fluids   Consultants:  None  Procedures:  None  Antimicrobials:  None   Subjective: Patient seen and evaluated today with improvement in nausea and vomiting noted and no further significant elevation in blood pressure noted.  Objective: Vitals:   04/18/23 0555 04/18/23 0600 04/18/23 0605 04/18/23 0648  BP: (!) 148/80 (!) 147/77 (!) 143/75 (!) 166/93  Pulse: 100 (!) 105 (!) 106 (!) 108  Resp: 17 16 16    Temp:    98.4 F (36.9 C)  TempSrc:    Oral  SpO2: 92% 93% 94% 99%  Weight:      Height:        Intake/Output Summary (Last 24 hours) at 04/18/2023 1303 Last data filed at 04/18/2023 0805 Gross per 24 hour  Intake 1778.33 ml  Output --  Net 1778.33 ml   Filed Weights   04/17/23 1107  Weight: 136.1 kg    Examination:  General exam: Appears calm and comfortable, morbidly obese Respiratory system: Clear to auscultation. Respiratory effort normal. Cardiovascular system: S1 & S2 heard, RRR.  Gastrointestinal system: Abdomen is soft Central nervous system: Alert and awake Extremities: No edema Skin: No significant lesions noted Psychiatry: Flat affect.    Data Reviewed: I have personally reviewed following labs and  imaging studies  CBC: Recent Labs  Lab 04/17/23 1321 04/18/23 0631  WBC 9.6 8.9  HGB 12.2 9.5*  HCT 41.4 30.6*  MCV 79.0* 77.7*  PLT 371 340   Basic Metabolic Panel: Recent Labs  Lab 04/17/23 1321 04/18/23 0631  NA 137 138  K 3.5 3.0*  CL 110 115*  CO2 15* 18*  GLUCOSE 241* 96  BUN 23* 23*  CREATININE 3.71* 3.84*  CALCIUM 8.7* 7.9*   GFR: Estimated Creatinine Clearance: 25.3 mL/min (A) (by C-G formula based on SCr of 3.84 mg/dL (H)). Liver Function Tests: Recent Labs  Lab 04/17/23 1321  AST 24  ALT 22  ALKPHOS 58  BILITOT 0.5  PROT 6.2*  ALBUMIN 1.8*   Recent Labs  Lab 04/17/23 1321  LIPASE 29   No results for input(s): "AMMONIA" in the last 168 hours. Coagulation Profile: No results for input(s): "INR", "PROTIME" in the last 168 hours. Cardiac Enzymes: No results for input(s): "CKTOTAL", "CKMB", "CKMBINDEX", "TROPONINI" in the last 168 hours. BNP (last 3 results) No results for input(s): "PROBNP" in the last 8760 hours. HbA1C: Recent Labs    04/17/23 1321  HGBA1C 8.5*   CBG: Recent Labs  Lab 04/17/23 2101 04/17/23 2359 04/18/23 0207 04/18/23 0747 04/18/23 1137  GLUCAP 224* 170* 141* 106* 132*   Lipid Profile: No results for input(s): "CHOL", "HDL", "LDLCALC", "TRIG", "CHOLHDL", "LDLDIRECT" in the last 72 hours. Thyroid Function Tests: No results for input(s): "TSH", "T4TOTAL", "FREET4", "T3FREE", "THYROIDAB" in the last 72 hours. Anemia Panel: Recent Labs    04/18/23 0631  VITAMINB12 1,021*  FOLATE 7.5  FERRITIN 7*  TIBC 196*  IRON 29  RETICCTPCT 2.3   Sepsis Labs: No results for input(s): "PROCALCITON", "LATICACIDVEN" in the last 168 hours.  No results found for this or any previous visit (from the past 240 hour(s)).       Radiology Studies: US Abdomen Limited RUQ (LIVER/GB)  Result Date: 04/17/2023 CLINICAL DATA:  151470 RUQ abdominal pain 151470.  Nausea/vomiting. EXAM: ULTRASOUND ABDOMEN LIMITED RIGHT UPPER QUADRANT  COMPARISON:  None Available. FINDINGS: Gallbladder: No gallstones or wall thickening visualized. No sonographic Murphy sign noted by sonographer. Common bile duct: Diameter: Up to 4 mm.  No intrahepatic bile duct dilation. Liver: No focal lesion identified. Within normal limits in parenchymal echogenicity. Portal vein is patent on color Doppler imaging with normal direction of blood flow towards the liver. Other: None. IMPRESSION: 1. Normal right upper quadrant sonogram. Electronically Signed   By: Jules Schick M.D.   On: 04/17/2023 14:07        Scheduled Meds:  amLODipine  10 mg Oral Daily   apixaban  5 mg Oral BID   citalopram  20 mg Oral Daily   ferrous sulfate  325 mg Oral Daily   insulin aspart  0-15 Units Subcutaneous Q6H   metoprolol succinate  25 mg Oral Daily   pantoprazole  40 mg Oral Daily   Continuous Infusions:  0.9 % NaCl with KCl 20 mEq / L 100 mL/hr at 04/18/23 0745   niCARDipine Stopped (04/18/23 0130)     LOS: 1  day    Time spent: 35 minutes    Melissa Brasil Hoover Brunette, DO [facility]  If 7PM-7AM, please contact night-coverage [email or web address] 04/18/2023, 1:03 PM

## 2023-04-18 NOTE — Progress Notes (Signed)
Progress Note  Patient was admitted for hypertensive emergency and was placed on nicardipine drip.  RN called due to patient's SBP drop into 130s.  Nicardipine drip was held, IV labetalol as needed was ordered, home meds (amlodipine and metoprolol) were ordered

## 2023-04-19 ENCOUNTER — Inpatient Hospital Stay (HOSPITAL_COMMUNITY): Payer: No Typology Code available for payment source

## 2023-04-19 DIAGNOSIS — I161 Hypertensive emergency: Secondary | ICD-10-CM | POA: Diagnosis not present

## 2023-04-19 LAB — COMPREHENSIVE METABOLIC PANEL
ALT: 18 U/L (ref 0–44)
AST: 17 U/L (ref 15–41)
Albumin: <1.5 g/dL — ABNORMAL LOW (ref 3.5–5.0)
Alkaline Phosphatase: 46 U/L (ref 38–126)
Anion gap: 8 (ref 5–15)
BUN: 22 mg/dL — ABNORMAL HIGH (ref 6–20)
CO2: 16 mmol/L — ABNORMAL LOW (ref 22–32)
Calcium: 7.9 mg/dL — ABNORMAL LOW (ref 8.9–10.3)
Chloride: 113 mmol/L — ABNORMAL HIGH (ref 98–111)
Creatinine, Ser: 4.06 mg/dL — ABNORMAL HIGH (ref 0.44–1.00)
GFR, Estimated: 13 mL/min — ABNORMAL LOW (ref >60)
Glucose, Bld: 123 mg/dL — ABNORMAL HIGH (ref 70–99)
Potassium: 3.4 mmol/L — ABNORMAL LOW (ref 3.5–5.1)
Sodium: 137 mmol/L (ref 135–145)
Total Bilirubin: 0.4 mg/dL (ref 0.3–1.2)
Total Protein: 4.8 g/dL — ABNORMAL LOW (ref 6.5–8.1)

## 2023-04-19 LAB — GLUCOSE, CAPILLARY
Glucose-Capillary: 114 mg/dL — ABNORMAL HIGH (ref 70–99)
Glucose-Capillary: 104 mg/dL — ABNORMAL HIGH (ref 70–99)
Glucose-Capillary: 95 mg/dL (ref 70–99)
Glucose-Capillary: 119 mg/dL — ABNORMAL HIGH (ref 70–99)
Glucose-Capillary: 142 mg/dL — ABNORMAL HIGH (ref 70–99)
Glucose-Capillary: 163 mg/dL — ABNORMAL HIGH (ref 70–99)

## 2023-04-19 LAB — MAGNESIUM: Magnesium: 1.9 mg/dL (ref 1.7–2.4)

## 2023-04-19 LAB — CBC
HCT: 31.8 % — ABNORMAL LOW (ref 36.0–46.0)
Hemoglobin: 9.1 g/dL — ABNORMAL LOW (ref 12.0–15.0)
MCH: 23.3 pg — ABNORMAL LOW (ref 26.0–34.0)
MCHC: 28.6 g/dL — ABNORMAL LOW (ref 30.0–36.0)
MCV: 81.5 fL (ref 80.0–100.0)
Platelets: 335 10*3/uL (ref 150–400)
RBC: 3.9 MIL/uL (ref 3.87–5.11)
RDW: 19.6 % — ABNORMAL HIGH (ref 11.5–15.5)
WBC: 7.2 10*3/uL (ref 4.0–10.5)
nRBC: 0 % (ref 0.0–0.2)

## 2023-04-19 MED ORDER — PNEUMOCOCCAL 20-VAL CONJ VACC 0.5 ML IM SUSY
0.5000 mL | PREFILLED_SYRINGE | INTRAMUSCULAR | Status: AC
  Administered 2023-04-20: 0.5 mL via INTRAMUSCULAR
  Filled 2023-04-19: qty 0.5

## 2023-04-19 MED ORDER — INFLUENZA VIRUS VACC SPLIT PF (FLUZONE) 0.5 ML IM SUSY
0.5000 mL | PREFILLED_SYRINGE | INTRAMUSCULAR | Status: AC
  Administered 2023-04-20: 0.5 mL via INTRAMUSCULAR
  Filled 2023-04-19: qty 0.5

## 2023-04-19 MED ORDER — LACTATED RINGERS IV SOLN: INTRAVENOUS | Status: DC

## 2023-04-19 MED ORDER — SODIUM BICARBONATE 650 MG PO TABS
650.0000 mg | ORAL_TABLET | Freq: Two times a day (BID) | ORAL | Status: DC
  Administered 2023-04-19 – 2023-04-29 (×21): 650 mg via ORAL
  Filled 2023-04-19 (×22): qty 1

## 2023-04-19 MED ORDER — POTASSIUM CHLORIDE CRYS ER 20 MEQ PO TBCR
40.0000 meq | EXTENDED_RELEASE_TABLET | Freq: Once | ORAL | Status: AC
  Administered 2023-04-19: 40 meq via ORAL
  Filled 2023-04-19: qty 2

## 2023-04-19 MED ORDER — METOCLOPRAMIDE HCL 5 MG/ML IJ SOLN
10.0000 mg | Freq: Four times a day (QID) | INTRAMUSCULAR | Status: DC
  Administered 2023-04-19 – 2023-04-20 (×4): 10 mg via INTRAVENOUS
  Filled 2023-04-19 (×4): qty 2

## 2023-04-19 NOTE — Progress Notes (Signed)
PROGRESS NOTE    Melissa [name]  WUX:[id number] DOB: Apr 22, 1976 DOA: 04/17/2023 PCP: Shelby Dubin, FNP   Brief Narrative:     Melissa [name] is a 47 y.o. female with medical history significant for DM, HTN.  Patient presented to the ED with complaints of nausea for the past 2 weeks, acute worsening of abdominal pain visited yesterday, and since onset last night.  Patient was admitted for hypertensive emergency in the setting of intractable nausea and vomiting.  She is also noted to have AKI.  Blood pressures have improved as well as the nausea and vomiting and diet will be advanced.  It appears that her AKI is currently worsening and she will be restarted on IV fluid with close monitoring of strict I's and O's.  Renal ultrasound as well as urine electrolytes ordered.  If condition appears to be worsening in a.m., will require nephrology evaluation.  Assessment & Plan:   Principal Problem:   Hypertensive emergency Active Problems:   AKI (acute kidney injury) (HCC)   Intractable vomiting   DM (diabetes mellitus) (HCC)   Atrial fibrillation (HCC)   Morbid obesity with body mass index (BMI) of 40.0 to 49.9 (HCC)  Assessment and Plan:  Hypertensive emergency-resolved Elevated blood pressure complicated by AKI.  Blood pressure elevated 264/135, no response to IV labetalol and IV hydralazine 10 mg each.  Is not taking any of her antihypertensives in at least 2 weeks-due to financial challenges.  She is supposed to be on Norvasc 10 mg, metoprolol 25 mg daily. -Cardene drip was held as blood pressures improved -Resumed home oral agents with good control noted   Intractable vomiting-resolved Multiple episodes of vomiting overnight, with acute on chronic right upper quadrant abdominal pain.  Liver enzymes WNL. RUQ [place]- normal.  EGD- 8/23-unremarkable.  Per Care Everywhere - last CT 10/2021 -  no acute abnormality.  Reports occasional marijuana use, also diabetic.  Differentials include-  gastroparesis, marijuana induced cyclical vomiting. -Might have cannabis hyperemesis, but she claims that this helps her with her nausea and vomiting -Advanced to soft diet and currently tolerating   AKI (acute kidney injury) (HCC) Cr elevated at 4.06, last checked a year ago was 1.4.  AKI in the setting of hypertensive emergency, and dehydration from vomiting and poor oral intake over the past few days. -Hydrate with LR and monitor strict I's and O's -Urine electrolytes and renal ultrasound ordered and pending -Noted proteinuria on urine analysis -Obtain nephrology consultation in a.m. as needed  Anemia-stable -Continue to monitor, anemia panel without any significant abnormalities -Check CBC in a.m.  Hypokalemia likely secondary to GI losses -Replete orally and recheck in a.m.   Atrial fibrillation (HCC) History of atrial fibrillation, supposed to be on anticoagulation with Eliquis.  Heart rate 97-135. -Resume Eliquis -Follow-up EKG   DM (diabetes mellitus) (HCC) - SSI- M -Hold home glipizide and Actos - HgbA1c 8.5%  Morbid obesity BMI 49.92    DVT prophylaxis:Eliquis Code Status: Full Family Communication: Sister on phone 10/22 Disposition Plan:  Status is: Inpatient Remains inpatient appropriate because: Need for IV fluids   Consultants:  None  Procedures:  None  Antimicrobials:  None   Subjective: Patient seen and evaluated today with no further nausea, vomiting, or abdominal pain noted this morning.  Blood pressures appear to remain stable.  Creatinine level appears to be increasing despite fluid administered yesterday.  Patient states that she is urinating.  Objective: Vitals:   04/18/23 0648 04/18/23 1742 04/18/23 1942 04/19/23  0607  BP: (!) 166/93 (!) 176/81 (!) 162/94 (!) 151/70  Pulse: (!) 108 (!) 108 (!) 107 100  Resp:  18 18   Temp: 98.4 F (36.9 C) 98.8 F (37.1 C) 99.5 F (37.5 C) 98.7 F (37.1 C)  TempSrc: Oral Oral Oral Oral  SpO2: 99%  99% 97% 96%  Weight:      Height:        Intake/Output Summary (Last 24 hours) at 04/19/2023 1228 Last data filed at 04/19/2023 0920 Gross per 24 hour  Intake 1168.33 ml  Output --  Net 1168.33 ml   Filed Weights   04/17/23 1107  Weight: 136.1 kg    Examination:  General exam: Appears calm and comfortable, morbidly obese Respiratory system: Clear to auscultation. Respiratory effort normal. Cardiovascular system: S1 & S2 heard, RRR.  Gastrointestinal system: Abdomen is soft Central nervous system: Alert and awake Extremities: No edema Skin: No significant lesions noted Psychiatry: Flat affect.    Data Reviewed: I have personally reviewed following labs and imaging studies  CBC: Recent Labs  Lab 04/17/23 1321 04/18/23 0631 04/19/23 0902  WBC 9.6 8.9 7.2  HGB 12.2 9.5* 9.1*  HCT 41.4 30.6* 31.8*  MCV 79.0* 77.7* 81.5  PLT 371 340 335   Basic Metabolic Panel: Recent Labs  Lab 04/17/23 1321 04/18/23 0631 04/19/23 0902  NA 137 138 137  K 3.5 3.0* 3.4*  CL 110 115* 113*  CO2 15* 18* 16*  GLUCOSE 241* 96 123*  BUN 23* 23* 22*  CREATININE 3.71* 3.84* 4.06*  CALCIUM 8.7* 7.9* 7.9*  MG  --   --  1.9   GFR: Estimated Creatinine Clearance: 24 mL/min (A) (by C-G formula based on SCr of 4.06 mg/dL (H)). Liver Function Tests: Recent Labs  Lab 04/17/23 1321 04/19/23 0902  AST 24 17  ALT 22 18  ALKPHOS 58 46  BILITOT 0.5 0.4  PROT 6.2* 4.8*  ALBUMIN 1.8* <1.5*   Recent Labs  Lab 04/17/23 1321  LIPASE 29   No results for input(s): "AMMONIA" in the last 168 hours. Coagulation Profile: No results for input(s): "INR", "PROTIME" in the last 168 hours. Cardiac Enzymes: No results for input(s): "CKTOTAL", "CKMB", "CKMBINDEX", "TROPONINI" in the last 168 hours. BNP (last 3 results) No results for input(s): "PROBNP" in the last 8760 hours. HbA1C: Recent Labs    04/17/23 1321  HGBA1C 8.5*   CBG: Recent Labs  Lab 04/18/23 2034 04/19/23 0108  04/19/23 0610 04/19/23 0736 04/19/23 1148  GLUCAP 172* 114* 104* 95 119*   Lipid Profile: No results for input(s): "CHOL", "HDL", "LDLCALC", "TRIG", "CHOLHDL", "LDLDIRECT" in the last 72 hours. Thyroid Function Tests: No results for input(s): "TSH", "T4TOTAL", "FREET4", "T3FREE", "THYROIDAB" in the last 72 hours. Anemia Panel: Recent Labs    04/18/23 0631  VITAMINB12 1,021*  FOLATE 7.5  FERRITIN 7*  TIBC 196*  IRON 29  RETICCTPCT 2.3   Sepsis Labs: No results for input(s): "PROCALCITON", "LATICACIDVEN" in the last 168 hours.  No results found for this or any previous visit (from the past 240 hour(s)).       Radiology Studies: US Abdomen Limited RUQ (LIVER/GB)  Result Date: 04/17/2023 CLINICAL DATA:  151470 RUQ abdominal pain 151470.  Nausea/vomiting. EXAM: ULTRASOUND ABDOMEN LIMITED RIGHT UPPER QUADRANT COMPARISON:  None Available. FINDINGS: Gallbladder: No gallstones or wall thickening visualized. No sonographic Murphy sign noted by sonographer. Common bile duct: Diameter: Up to 4 mm.  No intrahepatic bile duct dilation. Liver: No focal lesion identified. Within  normal limits in parenchymal echogenicity. Portal vein is patent on color Doppler imaging with normal direction of blood flow towards the liver. Other: None. IMPRESSION: 1. Normal right upper quadrant sonogram. Electronically Signed   By: Jules Schick M.[name].   On: 04/17/2023 14:07        Scheduled Meds:  amLODipine  10 mg Oral Daily   apixaban  5 mg Oral BID   citalopram  20 mg Oral Daily   ferrous sulfate  325 mg Oral Daily   insulin aspart  0-15 Units Subcutaneous Q6H   metoCLOPramide  10 mg Intravenous Q6H   metoprolol succinate  25 mg Oral Daily   pantoprazole  40 mg Oral Daily   potassium chloride  40 mEq Oral Once   sodium bicarbonate  650 mg Oral BID   Continuous Infusions:  lactated ringers     niCARDipine Stopped (04/18/23 0130)     LOS: 2 days    Time spent: 55 minutes    Melissa [name]  Sherryll Burger, DO [facility]  If 7PM-7AM, please contact night-coverage [email or web address] 04/19/2023, 12:28 PM

## 2023-04-20 DIAGNOSIS — I161 Hypertensive emergency: Secondary | ICD-10-CM | POA: Diagnosis not present

## 2023-04-20 LAB — URINALYSIS, ROUTINE W REFLEX MICROSCOPIC
Bilirubin Urine: NEGATIVE
Glucose, UA: >=500 mg/dL — AB
Ketones, ur: NEGATIVE mg/dL
Leukocytes,Ua: NEGATIVE
Nitrite: NEGATIVE
Protein, ur: >=300 mg/dL — AB
Specific Gravity, Urine: 1.014 (ref 1.005–1.030)
pH: 6 (ref 5.0–8.0)

## 2023-04-20 LAB — BASIC METABOLIC PANEL
Anion gap: 6 (ref 5–15)
BUN: 24 mg/dL — ABNORMAL HIGH (ref 6–20)
CO2: 18 mmol/L — ABNORMAL LOW (ref 22–32)
Calcium: 7.8 mg/dL — ABNORMAL LOW (ref 8.9–10.3)
Chloride: 111 mmol/L (ref 98–111)
Creatinine, Ser: 4.15 mg/dL — ABNORMAL HIGH (ref 0.44–1.00)
GFR, Estimated: 13 mL/min — ABNORMAL LOW (ref >60)
Glucose, Bld: 90 mg/dL (ref 70–99)
Potassium: 3.6 mmol/L (ref 3.5–5.1)
Sodium: 135 mmol/L (ref 135–145)

## 2023-04-20 LAB — CBC
HCT: 30.4 % — ABNORMAL LOW (ref 36.0–46.0)
Hemoglobin: 8.8 g/dL — ABNORMAL LOW (ref 12.0–15.0)
MCH: 23 pg — ABNORMAL LOW (ref 26.0–34.0)
MCHC: 28.9 g/dL — ABNORMAL LOW (ref 30.0–36.0)
MCV: 79.6 fL — ABNORMAL LOW (ref 80.0–100.0)
Platelets: 291 10*3/uL (ref 150–400)
RBC: 3.82 MIL/uL — ABNORMAL LOW (ref 3.87–5.11)
RDW: 19 % — ABNORMAL HIGH (ref 11.5–15.5)
WBC: 6.4 10*3/uL (ref 4.0–10.5)
nRBC: 0 % (ref 0.0–0.2)

## 2023-04-20 LAB — IRON AND TIBC
Iron: 104 ug/dL (ref 28–170)
Saturation Ratios: 55 % — ABNORMAL HIGH (ref 10.4–31.8)
TIBC: 190 ug/dL — ABNORMAL LOW (ref 250–450)
UIBC: 86 ug/dL

## 2023-04-20 LAB — PROTEIN / CREATININE RATIO, URINE
Creatinine, Urine: 110 mg/dL
Protein Creatinine Ratio: 12.04 mg/mg{creat} — ABNORMAL HIGH (ref 0.00–0.15)
Total Protein, Urine: 1324 mg/dL

## 2023-04-20 LAB — GLUCOSE, CAPILLARY
Glucose-Capillary: 88 mg/dL (ref 70–99)
Glucose-Capillary: 82 mg/dL (ref 70–99)
Glucose-Capillary: 151 mg/dL — ABNORMAL HIGH (ref 70–99)
Glucose-Capillary: 170 mg/dL — ABNORMAL HIGH (ref 70–99)
Glucose-Capillary: 194 mg/dL — ABNORMAL HIGH (ref 70–99)

## 2023-04-20 LAB — CREATININE, URINE, RANDOM: Creatinine, Urine: 87 mg/dL

## 2023-04-20 LAB — SODIUM, URINE, RANDOM
Sodium, Ur: 33 mmol/L
Sodium, Ur: 32 mmol/L

## 2023-04-20 LAB — SEDIMENTATION RATE: Sed Rate: 131 mm/h — ABNORMAL HIGH (ref 0–22)

## 2023-04-20 LAB — MAGNESIUM: Magnesium: 1.9 mg/dL (ref 1.7–2.4)

## 2023-04-20 MED ORDER — METOCLOPRAMIDE HCL 5 MG/ML IJ SOLN: 10.0000 mg | Freq: Four times a day (QID) | INTRAMUSCULAR | Status: DC | PRN

## 2023-04-20 MED ORDER — ONDANSETRON HCL 4 MG PO TABS: 4.0000 mg | ORAL_TABLET | Freq: Four times a day (QID) | ORAL | Status: DC | PRN

## 2023-04-20 MED ORDER — ONDANSETRON HCL 4 MG/2ML IJ SOLN
4.0000 mg | Freq: Four times a day (QID) | INTRAMUSCULAR | Status: DC | PRN
  Administered 2023-04-21: 4 mg via INTRAVENOUS
  Filled 2023-04-20: qty 2

## 2023-04-20 NOTE — Progress Notes (Addendum)
[facility] PROGRESS NOTE  Melissa [name] (DOB: 12/23/1975) UJW:[id number] PCP: Shelby Dubin, FNP  Brief Narrative: ADRENE MANN is a 47 y.o. female with a history of T2DM, HTN, obesity who presented to the ED on 04/17/2023 with nausea, abdominal discomfort found to have AKI, severe HTN. Blood pressure improved with IV treatments and transitioned to oral agents. She remains hospitalized due to persistent creatinine elevation for which nephrology is consulted and further work up is ongoing.  Subjective: Feels fine, maybe some swelling, maybe urine output less than her normal would be. No dyspnea or chest pain.   Objective: BP (!) 108/107 (BP Location: Right Arm)   Pulse 98   Temp 99.5 F (37.5 C) (Oral)   Resp 18   Ht 5\' 5"  (1.651 m)   Wt (!) 141.5 kg   LMP 04/03/2023   SpO2 99%   BMI 51.91 kg/m   Gen: No distress Pulm: Clear, nonlabored  CV: RRR, no MRG, no pitting edema GI: Soft, NT, ND, +BS Neuro: Alert and oriented. No new focal deficits. Ext: Warm, no deformities. Skin: No rashes, lesions or ulcers on visualized skin   Assessment & Plan: HTN emergency: Improved, now on oral agents.  - Continue norvasc, metoprolol, prn's as ordered. Will reinforce need to continue this at discharge. - Avoid ACE/ARB/ARNI with AKI as below  AKI, ATN: On baseline Cr ~1.4 which may be CKD III, though unclear. Rate of rise decreasing, but has not improved with supportive measures alone. Has concomitant NAGMA though not severe and no other indications for RRT at this time.  - FENa 1.17%. - U/S: No obstruction, no casts on UA.  - Manage BP appropriately, avoid hypotension/relative hypotension - Avoid contrast, ACE/ARB, etc. and NSAIDs.  - Since she's taking po again, can avoid IVF, continue bicarb tabs until renal recovery noted.  - Nephrology consult appreciated. UPC, SPEP pending. 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 Has proteinuria documented chronically.   T2DM:  HbA1c 8.5% - Holding Selman, SGLT2i with AKI.  - Continue SSI - Urged her to follow up with PCP, will be seeking a different provider and endocrinology referral.   Nausea, vomiting: LFTs wnl, RUQ U/S wnl. - Continue diet as tolerated supported by prn antiemetics.   Iron deficiency anemia:  - Monitor. Has trended downward with all cell lines suggestive of initial hemoconcentration.   Hypokalemia:  - Supplement prn  PAF: Currently in NSR.  - Continue home DOAC, continue BB  Morbid obesity: Body mass index is 51.91 kg/m.   Tyrone Nine, MD [facility] [email or web address] 04/20/2023, 1:06 PM

## 2023-04-20 NOTE — Consult Note (Signed)
Reason for Consult: AKI/CKD Referring Physician: Jarvis Newcomer, MD  Melissa [name] is an 47 y.o. female with a PMH significant for DM type 2, HTN, obesity, GERD, and CKD stage IIIb who presented to [facility] ED on 04/17/23 with a 2 week history of nausea but no vomiting until the 3-4 days prior to admission.  She also complained of acute worsening of abdominal pain.  She reports that she was unable to eat or drink anything during that time.  In the ED, Temp 97.5, Bp 215/102, HR 126, RR 16, SpO2 98%.  Labs notable for Co2 15, BUN 23, Cr 3.71, Ca 8.7, alb 1.8, UA with >500 glucose, >300 protein, + ketones, small blood.  She was admitted to the ICU with hypertensive urgency and started on cardene drip as well as prn IV cardizem, labetalol, and hydralazine.  Her blood pressure improved, however we were consulted due to rising Scr.  The trend in Scr is seen below.    Unfortunately we do not know her baseline Scr as no labs have been documented for the past 14 months but she did have evidence of CKD stage IIIb and has never seen a Nephrologist.  She also admits that she had been out of her Bp medications for the last 2 weeks due to financial issues.  Trend in Creatinine: Creatinine, Ser  Date/Time Value Ref Range Status  04/20/2023 05:09 AM 4.15 (H) 0.44 - 1.00 mg/dL Final  11/91/4782 95:62 AM 4.06 (H) 0.44 - 1.00 mg/dL Final  13/01/6577 46:96 AM 3.84 (H) 0.44 - 1.00 mg/dL Final  29/52/8413 24:40 PM 3.71 (H) 0.44 - 1.00 mg/dL Final  04/24/2535 64:40 AM 1.40 (H) 0.44 - 1.00 mg/dL Final  34/74/2595 6.38 (H) 0.60 - 1.10 mg/dL Final  75/64/3329 5.18 (H) 0.60 - 1.10 mg/dL Final  84/16/6063 0.16 (H) 0.60 - 1.10 mg/dL Final  07/06/3233 5.73 (H) 0.60 - 1.10 mg/dL Final  22/07/5425 0.62 (H) 0.60 - 1.10 mg/dL Final    PMH:   Past Medical History:  Diagnosis Date   Anemia    Anxiety    Complication of anesthesia    Diabetes mellitus without complication (HCC)    Dysrhythmia    GERD (gastroesophageal reflux disease)     History of hiatal hernia    Hypertension    PONV (postoperative nausea and vomiting)     PSH:   Past Surgical History:  Procedure Laterality Date   BIOPSY  02/02/2022   Procedure: BIOPSY;  Surgeon: Dolores Frame, MD;  Location: [facility];  Service: Gastroenterology;;   ESOPHAGOGASTRODUODENOSCOPY (EGD) WITH PROPOFOL N/A 02/02/2022   Procedure: ESOPHAGOGASTRODUODENOSCOPY (EGD) WITH PROPOFOL;  Surgeon: Dolores Frame, MD;  Location: [facility];  Service: Gastroenterology;  Laterality: N/A;  240   NO PAST SURGERIES      Allergies: No Known Allergies  Medications:   Prior to Admission medications   Medication Sig Start Date End Date Taking? Authorizing Provider  amLODipine (NORVASC) 10 MG tablet Take 10 mg by mouth daily. 12/16/21  Yes [provider]  cetirizine (ZYRTEC) 10 MG tablet Take 10 mg by mouth daily. 09/16/20  Yes [provider]  citalopram (CELEXA) 20 MG tablet Take 20 mg by mouth daily. 05/07/19  Yes [provider]  ELIQUIS 5 MG TABS tablet Take 5 mg by mouth 2 (two) times daily. 08/24/21  Yes [provider]  glimepiride (AMARYL) 4 MG tablet Take 4 mg by mouth every morning. 08/24/21  Yes [provider]  JENCYCLA 0.35  MG tablet Take 1 tablet by mouth daily. 11/25/21  Yes [provider]  metoprolol succinate (TOPROL-XL) 25 MG 24 hr tablet Take 25 mg by mouth daily. 11/23/21  Yes [provider]  pantoprazole (PROTONIX) 40 MG tablet Take 40 mg by mouth daily.   Yes [provider]  pioglitazone (ACTOS) 15 MG tablet Take 15 mg by mouth daily.   Yes [provider]  ferrous sulfate 325 (65 FE) MG tablet Take 325 mg by mouth daily. Patient not taking: Reported on 04/19/2023    [provider]    Inpatient medications:  amLODipine  10 mg Oral Daily   apixaban  5 mg Oral BID   citalopram  20 mg Oral Daily   ferrous sulfate  325 mg Oral Daily   influenza vac split  trivalent PF  0.5 mL Intramuscular Tomorrow-1000   insulin aspart  0-15 Units Subcutaneous Q6H   metoCLOPramide  10 mg Intravenous Q6H   metoprolol succinate  25 mg Oral Daily   pantoprazole  40 mg Oral Daily   pneumococcal 20-valent conjugate vaccine  0.5 mL Intramuscular Tomorrow-1000   sodium bicarbonate  650 mg Oral BID    Discontinued Meds:   Medications Discontinued During This Encounter  Medication Reason   sodium chloride 0.9 % bolus 1,000 mL    sodium chloride 0.9 % bolus 500 mL    0.9 % NaCl with KCl 20 mEq/ L  infusion    insulin aspart (novoLOG) injection 0-15 Units    potassium chloride SA (KLOR-CON M) CR tablet 40 mEq    0.9 % NaCl with KCl 20 mEq/ L  infusion    apixaban (ELIQUIS) tablet 5 mg    apixaban (ELIQUIS) tablet 2.5 mg    metoCLOPramide (REGLAN) injection 10 mg    Vitamin D, Ergocalciferol, (DRISDOL) 1.25 MG (50000 UNIT) CAPS capsule Patient Preference   atorvastatin (LIPITOR) 80 MG tablet Patient Preference   nicardipine (CARDENE) 20mg  in 0.86% saline IV infusion (0.1 mg/ml)     Social History:  reports that she has quit smoking. Her smoking use included cigarettes. She has been exposed to tobacco smoke. She has quit using smokeless tobacco. She reports current alcohol use. She reports that she does not use drugs.  Family History:  History reviewed. No pertinent family history.  Pertinent items are noted in HPI. Weight change:   Intake/Output Summary (Last 24 hours) at 04/20/2023 0735 Last data filed at 04/20/2023 0215 Gross per 24 hour  Intake 975 ml  Output 900 ml  Net 75 ml   BP (!) 159/92 (BP Location: Left Arm)   Pulse 88   Temp 99.4 F (37.4 C) (Axillary)   Resp 18   Ht 5\' 5"  (1.651 m)   Wt 136.1 kg   LMP 04/03/2023   SpO2 97%   BMI 49.92 kg/m  Vitals:   04/19/23 0607 04/19/23 1332 04/19/23 2019 04/20/23 0405  BP: (!) 151/70 136/74 (!) 158/91 (!) 159/92  Pulse: 100 90 93 88  Resp:  17 18 18   Temp: 98.7 F (37.1 C) 98.8 F  (37.1 C) 99.2 F (37.3 C) 99.4 F (37.4 C)  TempSrc: Oral Oral Oral Axillary  SpO2: 96% 100% 93% 97%  Weight:      Height:         General appearance: alert, cooperative, and no distress Head: Normocephalic, without obvious abnormality, atraumatic Eyes: negative findings: lids and lashes normal, conjunctivae and sclerae normal, and corneas clear Resp: clear to auscultation bilaterally  Cardio: regular rate and rhythm, S1, S2 normal, no murmur, click, rub or gallop GI: soft, non-tender; bowel sounds normal; no masses,  no organomegaly Extremities: extremities normal, atraumatic, no cyanosis or edema  Labs: Basic Metabolic Panel: Recent Labs  Lab 04/17/23 1321 04/18/23 0631 04/19/23 0902 04/20/23 0509  NA 137 138 137 135  K 3.5 3.0* 3.4* 3.6  CL 110 115* 113* 111  CO2 15* 18* 16* 18*  GLUCOSE 241* 96 123* 90  BUN 23* 23* 22* 24*  CREATININE 3.71* 3.84* 4.06* 4.15*  ALBUMIN 1.8*  --  <1.5*  --   CALCIUM 8.7* 7.9* 7.9* 7.8*   Liver Function Tests: Recent Labs  Lab 04/17/23 1321 04/19/23 0902  AST 24 17  ALT 22 18  ALKPHOS 58 46  BILITOT 0.5 0.4  PROT 6.2* 4.8*  ALBUMIN 1.8* <1.5*   Recent Labs  Lab 04/17/23 1321  LIPASE 29   No results for input(s): "AMMONIA" in the last 168 hours. CBC: Recent Labs  Lab 04/17/23 1321 04/18/23 0631 04/19/23 0902 04/20/23 0509  WBC 9.6 8.9 7.2 6.4  HGB 12.2 9.5* 9.1* 8.8*  HCT 41.4 30.6* 31.8* 30.4*  MCV 79.0* 77.7* 81.5 79.6*  PLT 371 340 335 291   PT/INR: @LABRCNTIP (inr:5) Cardiac Enzymes: )No results for input(s): "CKTOTAL", "CKMB", "CKMBINDEX", "TROPONINI" in the last 168 hours. CBG: Recent Labs  Lab 04/19/23 1148 04/19/23 1555 04/19/23 2014 04/20/23 0236 04/20/23 0729  GLUCAP 119* 142* 163* 88 82    Iron Studies:  Recent Labs  Lab 04/18/23 0631  IRON 29  TIBC 196*  FERRITIN 7*    Xrays/Other Studies: US RENAL  Result Date: 04/19/2023 CLINICAL DATA:  Acute kidney injury. EXAM: RENAL / URINARY  TRACT ULTRASOUND COMPLETE COMPARISON:  None Available. FINDINGS: Large body habitus with decreased study resolution. Right Kidney: Renal measurements: 10.1 x 4.7 x 6.3 cm = volume: 157 mL. Echogenicity within normal limits. No mass or hydronephrosis visualized. Left Kidney: Renal measurements: 11.9 x 6.4 x 5.1 cm = volume: 203 mL. Echogenicity within normal limits. No mass or hydronephrosis visualized. Bladder: Appears normal for degree of bladder distention. Other: None. IMPRESSION: Negative renal ultrasound. Electronically Signed   By: Sebastian Ache M.D.   On: 04/19/2023 16:51     Assessment/Plan:  AKI/CKD stage IIIb - likely multifactorial with pre-renal initially given N/V and poor po intake but then complicated by hypertensive urgency.  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  Renal US without obstruction.  Underlying CKD likely due to poorly controlled DM and HTN.  She has had proteinuria of >300 for several years.  No ACE/ARB or SGLT-2 inhibitor for now given AKI.  Will order some serologies for completeness and follow.  No indication for dialysis at this time.  UOP improving and hopefully will see Scr starting to trend downward.  She had a similar presentation last year. Avoid nephrotoxic medications including NSAIDs and iodinated intravenous contrast exposure unless the latter is absolutely indicated.   Preferred narcotic agents for pain control are hydromorphone, fentanyl, and methadone. Morphine should not be used.  Avoid Baclofen and avoid oral sodium phosphate and magnesium citrate based laxatives / bowel preps.  Continue strict Input and Output monitoring. Will monitor the patient closely with you and intervene or adjust therapy as indicated by changes in clinical status/labs   Proteinuria - likely due to diabetic nephropathy.  Will check UPC and SPEP. Hypertensive urgency - admits that she has  been without her bp meds for the past  2 weeks due to financial issues.  Bp improving.    Intractable N/V - currently resolved.  Unclear etiology but likely gastroparesis or marijuana induced hyperemesis. Anemia - will check SPEP and iron stores.  Likely related to CKD.  Hold off on ESA for now.  Hypokalemia - likely due to GI losses but will check renin and aldo levels.  DM - per primary Morbid obesity  OSA - not on CPAP CKD stge IIIb vs progressive CKD stage IV - she will need to follow up with our office in [place] after discharge.    Julien Nordmann Izel Hochberg 04/20/2023, 7:35 AM

## 2023-04-20 NOTE — Plan of Care (Signed)
  Problem: Coping: Goal: Ability to adjust to condition or change in health will improve Outcome: Progressing   Problem: Skin Integrity: Goal: Risk for impaired skin integrity will decrease Outcome: Progressing   

## 2023-04-20 NOTE — Progress Notes (Signed)
No acute events overnight. Kellogg RN

## 2023-04-21 DIAGNOSIS — I161 Hypertensive emergency: Secondary | ICD-10-CM | POA: Diagnosis not present

## 2023-04-21 LAB — CBC
HCT: 29.7 % — ABNORMAL LOW (ref 36.0–46.0)
Hemoglobin: 9 g/dL — ABNORMAL LOW (ref 12.0–15.0)
MCH: 23.7 pg — ABNORMAL LOW (ref 26.0–34.0)
MCHC: 30.3 g/dL (ref 30.0–36.0)
MCV: 78.2 fL — ABNORMAL LOW (ref 80.0–100.0)
Platelets: 283 10*3/uL (ref 150–400)
RBC: 3.8 MIL/uL — ABNORMAL LOW (ref 3.87–5.11)
RDW: 19.2 % — ABNORMAL HIGH (ref 11.5–15.5)
WBC: 6.4 10*3/uL (ref 4.0–10.5)
nRBC: 0 % (ref 0.0–0.2)

## 2023-04-21 LAB — RENAL FUNCTION PANEL
Albumin: <1.5 g/dL — ABNORMAL LOW (ref 3.5–5.0)
Anion gap: 5 (ref 5–15)
BUN: 26 mg/dL — ABNORMAL HIGH (ref 6–20)
CO2: 18 mmol/L — ABNORMAL LOW (ref 22–32)
Calcium: 7.8 mg/dL — ABNORMAL LOW (ref 8.9–10.3)
Chloride: 112 mmol/L — ABNORMAL HIGH (ref 98–111)
Creatinine, Ser: 4.44 mg/dL — ABNORMAL HIGH (ref 0.44–1.00)
GFR, Estimated: 12 mL/min — ABNORMAL LOW (ref >60)
Glucose, Bld: 87 mg/dL (ref 70–99)
Phosphorus: 3.3 mg/dL (ref 2.5–4.6)
Potassium: 3.6 mmol/L (ref 3.5–5.1)
Sodium: 135 mmol/L (ref 135–145)

## 2023-04-21 LAB — EXTRACTABLE NUCLEAR ANTIGEN ANTIBODY
ENA SM Ab Ser-aCnc: <0.2 AI (ref 0.0–0.9)
Ribonucleic Protein: <0.2 AI (ref 0.0–0.9)
SSA (Ro) (ENA) Antibody, IgG: <0.2 AI (ref 0.0–0.9)
SSB (La) (ENA) Antibody, IgG: <0.2 AI (ref 0.0–0.9)
Scleroderma (Scl-70) (ENA) Antibody, IgG: <0.2 AI (ref 0.0–0.9)
ds DNA Ab: <1 [IU]/mL (ref 0–9)

## 2023-04-21 LAB — ANCA TITERS
Atypical P-ANCA titer: <1:20 {titer}
C-ANCA: <1:20 {titer}
P-ANCA: <1:20 {titer}

## 2023-04-21 LAB — GLUCOSE, CAPILLARY
Glucose-Capillary: 86 mg/dL (ref 70–99)
Glucose-Capillary: 94 mg/dL (ref 70–99)
Glucose-Capillary: 173 mg/dL — ABNORMAL HIGH (ref 70–99)
Glucose-Capillary: 207 mg/dL — ABNORMAL HIGH (ref 70–99)
Glucose-Capillary: 178 mg/dL — ABNORMAL HIGH (ref 70–99)

## 2023-04-21 LAB — C4 COMPLEMENT: Complement C4, Body Fluid: 39 mg/dL — ABNORMAL HIGH (ref 12–38)

## 2023-04-21 LAB — ANA W/REFLEX IF POSITIVE: Anti Nuclear Antibody (ANA): NEGATIVE

## 2023-04-21 LAB — C3 COMPLEMENT: C3 Complement: 123 mg/dL (ref 82–167)

## 2023-04-21 LAB — ANTI-JO 1 ANTIBODY, IGG: Anti JO-1: <0.2 AI (ref 0.0–0.9)

## 2023-04-21 MED ORDER — INSULIN ASPART 100 UNIT/ML IJ SOLN
0.0000 [IU] | Freq: Three times a day (TID) | INTRAMUSCULAR | Status: DC
  Administered 2023-04-22 (×2): 3 [IU] via SUBCUTANEOUS
  Administered 2023-04-23: 2 [IU] via SUBCUTANEOUS
  Administered 2023-04-23 – 2023-04-24 (×3): 3 [IU] via SUBCUTANEOUS
  Administered 2023-04-25: 2 [IU] via SUBCUTANEOUS
  Administered 2023-04-26: 3 [IU] via SUBCUTANEOUS
  Administered 2023-04-26: 2 [IU] via SUBCUTANEOUS
  Administered 2023-04-27 – 2023-04-28 (×2): 3 [IU] via SUBCUTANEOUS
  Administered 2023-04-28: 2 [IU] via SUBCUTANEOUS
  Administered 2023-04-29: 5 [IU] via SUBCUTANEOUS

## 2023-04-21 MED ORDER — ORAL CARE MOUTH RINSE: 15.0000 mL | OROMUCOSAL | Status: DC | PRN

## 2023-04-21 MED ORDER — APIXABAN 5 MG PO TABS
5.0000 mg | ORAL_TABLET | Freq: Two times a day (BID) | ORAL | Status: AC
  Administered 2023-04-22: 5 mg via ORAL
  Filled 2023-04-21 (×3): qty 1

## 2023-04-21 NOTE — Plan of Care (Signed)
  Problem: Education: Goal: Ability to describe self-care measures that may prevent or decrease complications (Diabetes Survival Skills Education) will improve Outcome: Progressing   

## 2023-04-21 NOTE — Progress Notes (Signed)
[facility] PROGRESS NOTE  Melissa [name] (DOB: Aug 07, 1975) UEA:[id number] PCP: Shelby Dubin, FNP  Brief Narrative: Melissa [name] is a 47 y.o. female with a history of T2DM, HTN, obesity who presented to the ED on 04/17/2023 with nausea, abdominal discomfort found to have AKI, severe HTN. Blood pressure improved with IV treatments and transitioned to oral agents. She remains hospitalized due to persistent creatinine elevation for which nephrology is consulted and further work up is ongoing. Renal biopsy is recommended.   Subjective: Feels her urine is darker than usual, having more UOP at this point today than at this point yesterday. Feels she's holding on to fluid in her abdomen. Sister on speaker phone during some of encounter.  Objective: BP (!) 145/69   Pulse 83   Temp 98.7 F (37.1 C) (Oral)   Resp 18   Ht 5\' 5"  (1.651 m)   Wt (!) 141.5 kg   LMP 04/03/2023   SpO2 99%   BMI 51.91 kg/m   Gen: No distress, very pleasant Pulm: Clear, nonlabored  CV: RRR, no MRG or JVD, trace pitting diffuse edema GI: Soft, NT, ND, +BS  Neuro: Alert and oriented. No new focal deficits. Ext: Warm, no deformities. Skin: No  rashes, lesions or ulcers on visualized skin   Assessment & Plan: HTN emergency: Improved, now on oral agents.  - Continue norvasc, metoprolol, prn's as ordered. Will reinforce need to continue this at discharge. - Avoid ACE/ARB/ARNI with AKI as below  AKI on stage IIIb CKD, ATN: On baseline Cr ~1.4 which may be CKD III, though unclear. Rate of rise decreasing, but has not improved with supportive measures alone. Has concomitant NAGMA though not severe and no other indications for RRT at this time.  - FENa 1.17%, ESR elevated, C3 and C4 not low, ANA ok; 12g proteinuria noted. ?nephrotic range proteinuria due to malignant HTN or FSGS, d/w nephrology who recommends biopsy. Discussed with pt/sister who consent to biopsy at [facility]. IR consulted. Last dose of eliquis 10/24 at  9AM - U/S: No obstruction, no casts on UA.  - Manage BP appropriately, avoid hypotension/relative hypotension - Avoid contrast, ACE/ARB, etc. and NSAIDs.  - Since she's taking po again, can avoid IVF, continuing bicarb tabs until renal recovery noted.  - Nephrology consult appreciated. SPEP pending. 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 Has proteinuria documented chronically.   T2DM: HbA1c 8.5% - Holding [place], SGLT2i with AKI.  - Continue SSI - Urged her to follow up with PCP, will be seeking a different provider and endocrinology referral.   Nausea, vomiting: LFTs wnl, RUQ U/S wnl. - Continue diet as tolerated supported by prn antiemetics.   Iron deficiency anemia:  - Monitor, hgb stabilized at 9g/dl. 55% saturation, iron 104, TIBC low.   Hypokalemia:  - Supplemented and resolved.   PAF: Currently in NSR.  - Continue BB - Hold DOAC for now for renal biopsy.   Morbid obesity: Body mass index is 51.91 kg/m.   Tyrone Nine, MD [facility] [email or web address] 04/21/2023, 2:34 PM

## 2023-04-21 NOTE — Progress Notes (Signed)
[facility] Progress Note   Assessment/ Plan:    AKI/CKD stage IIIb - likely multifactorial with pre-renal initially given N/V and poor po intake but then complicated by hypertensive urgency.  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  Renal US without obstruction.  Underlying CKD likely due to poorly controlled DM and HTN.  She has had proteinuria of >300 for several years.  No ACE/ARB or SGLT-2 inhibitor for now given AKI.  Will order some serologies for completeness and follow.  No indication for dialysis at this time.  UOP improving and hopefully will see Scr starting to trend downward.  She had a similar presentation last year. - 12 g proteinuria, ESR elevated - long discussion with pt and sister on the phone re: risks/ benefits/ alternatives to kidney biopsy - they are considering proceeding with biopsy - have stopped apixaban 10/24 AM in preparation.   - also explained that biopsy would likely need a road trip to [facility] and back- she is considering Proteinuria - likely due to diabetic nephropathy.  Will check UPC and SPEP. Hypertensive urgency - admits that she has been without her bp meds for the past 2 weeks due to financial issues.  Bp improving.    Intractable N/V - currently resolved.   Anemia - will check SPEP and iron stores.  Likely related to CKD.  Hold off on ESA for now.  Hypokalemia - likely due to GI losses but will check renin and aldo levels.  DM - per primary Morbid obesity  OSA - not on CPAP CKD stge IIIb vs progressive CKD stage IV - she will need to follow up with our office in [place] after discharge.   Subjective:    Seen in room.  UP/C 12 g and ESR 131.  Complements OK.  ANA OK   Objective:   BP (!) 151/79 (BP Location: Right Arm)   Pulse 84   Temp 97.8 F (36.6 C) (Oral)   Resp 16   Ht 5\' 5"  (1.651 m)   Wt (!) 141.5 kg   LMP 04/03/2023   SpO2 97%   BMI 51.91 kg/m    Intake/Output Summary (Last 24 hours) at 04/21/2023 1132 Last data filed at 04/21/2023 0620 Gross per 24 hour  Intake 1320 ml  Output 1400 ml  Net -80 ml   Weight change:   Physical Exam: Gen:NAD CVS: RRR Resp: clear Abd: protuberant Ext: no LE edema  Imaging: US RENAL  Result Date: 04/19/2023 CLINICAL DATA:  Acute kidney injury. EXAM: RENAL / URINARY TRACT ULTRASOUND COMPLETE COMPARISON:  None Available. FINDINGS: Large body habitus with decreased study resolution. Right Kidney: Renal measurements: 10.1 x 4.7 x 6.3 cm = volume: 157 mL. Echogenicity within normal limits. No mass or hydronephrosis visualized. Left Kidney: Renal measurements: 11.9 x 6.4 x 5.1 cm = volume: 203 mL. Echogenicity within normal limits. No mass or hydronephrosis visualized. Bladder: Appears normal for degree of bladder distention. Other: None. IMPRESSION: Negative renal ultrasound. Electronically Signed   By: Sebastian Ache M.D.   On: 04/19/2023 16:51    Labs: BMET Recent Labs  Lab 04/17/23 1321 04/18/23 0631 04/19/23 0902 04/20/23 0509 04/21/23 0457  NA 137 138 137 135 135  K 3.5 3.0* 3.4* 3.6 3.6  CL 110 115* 113* 111 112*  CO2 15* 18* 16* 18* 18*  GLUCOSE 241* 96 123* 90 87  BUN 23* 23* 22* 24* 26*  CREATININE 3.71* 3.84* 4.06*  4.15* 4.44*  CALCIUM 8.7* 7.9* 7.9* 7.8* 7.8*  PHOS  --   --   --   --  3.3   CBC Recent Labs  Lab 04/18/23 0631 04/19/23 0902 04/20/23 0509 04/21/23 0457  WBC 8.9 7.2 6.4 6.4  HGB 9.5* 9.1* 8.8* 9.0*  HCT 30.6* 31.8* 30.4* 29.7*  MCV 77.7* 81.5 79.6* 78.2*  PLT 340 335 291 283    Medications:     amLODipine  10 mg Oral Daily   citalopram  20 mg Oral Daily   insulin aspart  0-15 Units Subcutaneous Q6H   metoprolol succinate  25 mg Oral Daily   pantoprazole  40 mg Oral Daily   sodium bicarbonate  650 mg Oral BID    Bufford Buttner, MD 04/21/2023, 11:32 AM

## 2023-04-21 NOTE — Plan of Care (Signed)

## 2023-04-21 NOTE — Progress Notes (Signed)
   IR Note  Pt is scheduled for random renal biopsy at [facility] 10/28.  RN to arrange [facility] to have pt to [facility] by 8 am 10/28 We will consent pt here at [facility]  She will return to [facility] after procedure  LD Eliquis was this am Must be off x 2 days for safe procedure I have discussed with Dr Jarvis Newcomer He will hold Eliquis after 10/25 pm dose

## 2023-04-22 DIAGNOSIS — I161 Hypertensive emergency: Secondary | ICD-10-CM | POA: Diagnosis not present

## 2023-04-22 LAB — RENAL FUNCTION PANEL
Albumin: <1.5 g/dL — ABNORMAL LOW (ref 3.5–5.0)
Anion gap: 8 (ref 5–15)
BUN: 26 mg/dL — ABNORMAL HIGH (ref 6–20)
CO2: 17 mmol/L — ABNORMAL LOW (ref 22–32)
Calcium: 7.9 mg/dL — ABNORMAL LOW (ref 8.9–10.3)
Chloride: 110 mmol/L (ref 98–111)
Creatinine, Ser: 4.23 mg/dL — ABNORMAL HIGH (ref 0.44–1.00)
GFR, Estimated: 12 mL/min — ABNORMAL LOW (ref >60)
Glucose, Bld: 108 mg/dL — ABNORMAL HIGH (ref 70–99)
Phosphorus: 3.3 mg/dL (ref 2.5–4.6)
Potassium: 3.6 mmol/L (ref 3.5–5.1)
Sodium: 135 mmol/L (ref 135–145)

## 2023-04-22 LAB — GLUCOSE, CAPILLARY
Glucose-Capillary: 112 mg/dL — ABNORMAL HIGH (ref 70–99)
Glucose-Capillary: 199 mg/dL — ABNORMAL HIGH (ref 70–99)
Glucose-Capillary: 175 mg/dL — ABNORMAL HIGH (ref 70–99)
Glucose-Capillary: 181 mg/dL — ABNORMAL HIGH (ref 70–99)

## 2023-04-22 LAB — PROTIME-INR
INR: 1 (ref 0.8–1.2)
Prothrombin Time: 13.7 s (ref 11.4–15.2)

## 2023-04-22 LAB — COMPLEMENT, TOTAL: Compl, Total (CH50): 43 U/mL (ref >41)

## 2023-04-22 MED ORDER — MELATONIN 3 MG PO TABS: 6.0000 mg | ORAL_TABLET | Freq: Every day | ORAL | Status: DC

## 2023-04-22 MED ORDER — MELATONIN 3 MG PO TABS
6.0000 mg | ORAL_TABLET | Freq: Every day | ORAL | Status: DC
  Administered 2023-04-22 – 2023-04-28 (×7): 6 mg via ORAL
  Filled 2023-04-22 (×7): qty 2

## 2023-04-22 NOTE — Progress Notes (Addendum)
[facility] PROGRESS NOTE  Melissa [name] (DOB: [birth year]) WUJ:[id number] PCP: Melissa Dubin, FNP  Brief Narrative: Melissa [name] is a 47 y.o. female with a history of T2DM, HTN, obesity who presented to the ED on 04/17/2023 with nausea, abdominal discomfort found to have AKI, severe HTN. Blood pressure improved with IV treatments and transitioned to oral agents. She remains hospitalized due to persistent creatinine elevation for which nephrology is consulted and further work up is ongoing. Renal biopsy is recommended. Will hold DOAC in preparation for this on 10/28.   Subjective: Greater urine output which is also lighter in color, fluid in abdomen is still there. No other issues.   Objective: BP 137/67   Pulse 87   Temp 98.1 F (36.7 C) (Oral)   Resp 16   Ht 5\' 5"  (1.651 m)   Wt (!) 141.5 kg   LMP 04/03/2023   SpO2 96%   BMI 51.91 kg/m   Gen: No distress, pleasant Pulm: Clear, nonlabored anteriorly  CV: RRR, no MRG, no JVD, stable diffuse pitting edema that is minimal but noted including abd wall. GI: Soft, NT, ND, +BS Neuro: Alert and oriented. No new focal deficits. Ext: Warm, no deformities. Skin: No new rashes, lesions or ulcers on visualized skin   Assessment & Plan: HTN emergency: Improved, now on oral agents.  - Continue norvasc, metoprolol, prn's as ordered. Will reinforce need to continue this at discharge. - Avoid ACE/ARB/ARNI with AKI as below  AKI on stage IIIb CKD, ATN: On baseline Cr ~1.4 which may be CKD III, though unclear. Rate of rise decreasing, but has not improved with supportive measures alone. Has concomitant NAGMA though not severe and no other indications for RRT at this time.  - SCr may have peaked 10/24, UOP picking up, still some edema and CrCl remains < 33ml/min.  - FENa 1.17%, ESR elevated, C3 and C4 not low, ANA ok; 12g proteinuria noted. ?nephrotic range proteinuria due to malignant HTN or FSGS, d/w nephrology who recommends biopsy. Will  pursue 10/28 per IR schedule, hold eliquis after tonight's dose. NPO p MN and appropriate labs ordered for 10/28.  - U/S: No obstruction, no casts on UA.  - Manage BP appropriately, avoid hypotension/relative hypotension - Avoid contrast, ACE/ARB, etc. and NSAIDs.  - Since she's taking po again, can avoid IV, NAGMA remains, continuing bicarb tabs - Nephrology consult appreciated. SPEP pending. 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 Has proteinuria documented chronically.   T2DM: HbA1c 8.5% - Holding [place], SGLT2i with AKI.  - Continue SSI, largely at inpatient goal. - Urged her to follow up with PCP, will be seeking a different provider and endocrinology referral.   Nausea, vomiting: LFTs wnl, RUQ U/S wnl. - Continue diet as tolerated supported by prn antiemetics.   Iron deficiency anemia:  - Monitor, hgb stabilized at 9g/dl. 55% saturation, iron 104, TIBC low.   Hypokalemia:  - Supplemented and resolved.   PAF: Currently in NSR.  - Continue BB - Hold DOAC as needed for biopsy.  Morbid obesity: Body mass index is 51.91 kg/m.   Tyrone Nine, MD [facility] [email or web address] 04/22/2023, 11:57 AM

## 2023-04-22 NOTE — Progress Notes (Signed)
[facility] Progress Note   Assessment/ Plan:    AKI/CKD stage IIIb - likely multifactorial with pre-renal initially given N/V and poor po intake but then complicated by hypertensive urgency.  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  Renal US without obstruction.  Underlying CKD likely due to poorly controlled DM and HTN.  She has had proteinuria of >300 for several years.  No ACE/ARB or SGLT-2 inhibitor for now given AKI.  Will order some serologies for completeness and follow.  No indication for dialysis at this time.  UOP improving and hopefully will see Scr starting to trend downward.  She had a similar presentation last year. - 12 g proteinuria, ESR elevated - long discussion with pt and sister on the phone re: risks/ benefits/ alternatives to kidney biopsy - biopsy ordered with IR- appreciate assistance - requisition form placed in physical chart - have stopped apixaban 10/24 AM in preparation.   - also explained that biopsy would likely need a road trip to [facility] and back- she is considering Proteinuria - likely due to diabetic nephropathy.  Will check UPC and SPEP. Hypertensive urgency - admits that she has been without her bp meds for the past 2 weeks due to financial issues.  Bp improving.    Intractable N/V - currently resolved.   Anemia - will check SPEP and iron stores.  Likely related to CKD.  Hold off on ESA for now.  Hypokalemia - likely due to GI losses but will check renin and aldo levels.  DM - per primary Morbid obesity  OSA - not on CPAP CKD stge IIIb vs progressive CKD stage IV - she will need to follow up with our office in [place] after discharge.   Subjective:    Seen in room.  Cr finally getting a little better.  Agreeable to biopsy   Objective:   BP 136/64 (BP Location: Right Arm)   Pulse 86   Temp 97.6 F (36.4 C) (Oral)   Resp 18   Ht 5\' 5"  (1.651 m)   Wt (!) 141.5 kg   LMP  04/03/2023   SpO2 96%   BMI 51.91 kg/m   Intake/Output Summary (Last 24 hours) at 04/22/2023 1424 Last data filed at 04/22/2023 0900 Gross per 24 hour  Intake 240 ml  Output --  Net 240 ml   Weight change:   Physical Exam: Gen:NAD CVS: RRR Resp: clear Abd: protuberant Ext: no LE edema  Imaging: No results found.  Labs: BMET Recent Labs  Lab 04/17/23 1321 04/18/23 0631 04/19/23 0902 04/20/23 0509 04/21/23 0457 04/22/23 0404  NA 137 138 137 135 135 135  K 3.5 3.0* 3.4* 3.6 3.6 3.6  CL 110 115* 113* 111 112* 110  CO2 15* 18* 16* 18* 18* 17*  GLUCOSE 241* 96 123* 90 87 108*  BUN 23* 23* 22* 24* 26* 26*  CREATININE 3.71* 3.84* 4.06* 4.15* 4.44* 4.23*  CALCIUM 8.7* 7.9* 7.9* 7.8* 7.8* 7.9*  PHOS  --   --   --   --  3.3 3.3   CBC Recent Labs  Lab 04/18/23 0631 04/19/23 0902 04/20/23 0509 04/21/23 0457  WBC 8.9 7.2 6.4 6.4  HGB 9.5* 9.1* 8.8* 9.0*  HCT 30.6* 31.8* 30.4* 29.7*  MCV 77.7* 81.5 79.6* 78.2*  PLT 340 335 291 283    Medications:     amLODipine  10 mg Oral Daily   apixaban  5 mg Oral  BID   citalopram  20 mg Oral Daily   insulin aspart  0-15 Units Subcutaneous TID WC   melatonin  6 mg Oral QHS   metoprolol succinate  25 mg Oral Daily   pantoprazole  40 mg Oral Daily   sodium bicarbonate  650 mg Oral BID    Bufford Buttner, MD 04/22/2023, 2:24 PM

## 2023-04-22 NOTE — Progress Notes (Signed)
Called [facility] to set up transportation for procedure on Monday 10-28  at 8am at [facility].

## 2023-04-23 DIAGNOSIS — I161 Hypertensive emergency: Secondary | ICD-10-CM | POA: Diagnosis not present

## 2023-04-23 LAB — GLUCOSE, CAPILLARY
Glucose-Capillary: 102 mg/dL — ABNORMAL HIGH (ref 70–99)
Glucose-Capillary: 154 mg/dL — ABNORMAL HIGH (ref 70–99)
Glucose-Capillary: 122 mg/dL — ABNORMAL HIGH (ref 70–99)
Glucose-Capillary: 125 mg/dL — ABNORMAL HIGH (ref 70–99)

## 2023-04-23 LAB — RENAL FUNCTION PANEL
Albumin: <1.5 g/dL — ABNORMAL LOW (ref 3.5–5.0)
Anion gap: 8 (ref 5–15)
BUN: 27 mg/dL — ABNORMAL HIGH (ref 6–20)
CO2: 17 mmol/L — ABNORMAL LOW (ref 22–32)
Calcium: 8.3 mg/dL — ABNORMAL LOW (ref 8.9–10.3)
Chloride: 111 mmol/L (ref 98–111)
Creatinine, Ser: 4.3 mg/dL — ABNORMAL HIGH (ref 0.44–1.00)
GFR, Estimated: 12 mL/min — ABNORMAL LOW (ref >60)
Glucose, Bld: 93 mg/dL (ref 70–99)
Phosphorus: 3.7 mg/dL (ref 2.5–4.6)
Potassium: 3.7 mmol/L (ref 3.5–5.1)
Sodium: 136 mmol/L (ref 135–145)

## 2023-04-23 LAB — PROTEIN / CREATININE RATIO, URINE
Creatinine, Urine: 76 mg/dL
Protein Creatinine Ratio: 12.71 mg/mg{creat} — ABNORMAL HIGH (ref 0.00–0.15)
Total Protein, Urine: 966 mg/dL

## 2023-04-23 NOTE — Plan of Care (Signed)
  Problem: Metabolic: Goal: Ability to maintain appropriate glucose levels will improve 04/23/2023 0215 by Cottie Banda, Mardelle Matte, RN Outcome: Not Progressing 04/23/2023 0215 by Cottie Banda, Mardelle Matte, RN Outcome: Not Progressing   Problem: Safety: Goal: Ability to remain free from injury will improve 04/23/2023 0215 by Cottie Banda, Mardelle Matte, RN Outcome: Not Progressing 04/23/2023 0215 by Cottie Banda, Mardelle Matte, RN Outcome: Not Progressing

## 2023-04-23 NOTE — Plan of Care (Signed)

## 2023-04-23 NOTE — Progress Notes (Signed)
[facility] PROGRESS NOTE  DEMETRIAS CUCUZZA (DOB: Nov 03, 1975) [id number]:[id number] PCP: Shelby Dubin, FNP  Brief Narrative: Melissa [name] is a 47 y.o. female with a history of T2DM, HTN, obesity who presented to the ED on 04/17/2023 with nausea, abdominal discomfort found to have AKI, severe HTN. Blood pressure improved with IV treatments and transitioned to oral agents. She remains hospitalized due to persistent creatinine elevation for which nephrology is consulted and further work up is ongoing. Renal biopsy is recommended. Holding DOAC in preparation for this on 10/28.   Subjective: No real changes. Her sister is at the bedside, planning on walking in halls more today. Still having lighter urine output, still feeling swelling unchanged.   Objective: BP 124/60 (BP Location: Right Arm)   Pulse 80   Temp 98.5 F (36.9 C) (Oral)   Resp 18   Ht 5\' 5"  (1.651 m)   Wt (!) 141.5 kg   LMP 04/03/2023   SpO2 97%   BMI 51.91 kg/m   Gen: No distress Pulm: Clear, nonlabored  CV: RRR, no MRG GI: Soft, NT, ND, +BS Neuro: Alert and oriented. No new focal deficits. Ext: Warm, no deformities. There remains trace pitting edema in legs and abdominal wall Skin: No new rashes, lesions or ulcers on visualized skin   Assessment & Plan: HTN emergency: Improved, now on oral agents.  - Continue norvasc, metoprolol, prn's as ordered, will aim not to overcorrect into hypotension. Nephrology still following, ok with SBP 140.   - Avoiding ACE/ARB/ARNI with AKI as below  AKI on stage IIIb CKD, ATN: On baseline Cr ~1.4 which may be CKD III, though unclear. Rate of rise decreasing, but has not improved with supportive measures alone. Has concomitant NAGMA though not severe and no other indications for RRT at this time.  - SCr may have peaked 10/24, UOP picking up, still some edema and CrCl remains < 73ml/min. Stable. - FENa 1.17%, ESR elevated, C3 and C4 not low, ANA ok; 12g proteinuria noted. ?nephrotic range  proteinuria due to malignant HTN or FSGS, d/w nephrology who recommends biopsy. Will pursue 10/28 per IR schedule, holding eliquis. NPO p MN and appropriate labs ordered for 10/28.  - U/S: No obstruction, no casts on UA.  - Manage BP appropriately, avoid hypotension/relative hypotension - Avoid contrast, ACE/ARB, etc. and NSAIDs.  - Since she's taking po again, can avoid IV, NAGMA remains, continuing bicarb tabs - Nephrology consult appreciated. SPEP pending. 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 Has proteinuria documented chronically.   T2DM: HbA1c 8.5% - Holding [place], SGLT2i with AKI.  - Continue SSI, largely at inpatient goal. - Urged her to follow up with PCP, will be seeking a different provider and endocrinology referral.   Nausea, vomiting: LFTs wnl, RUQ U/S wnl. - Continue diet as tolerated supported by prn antiemetics.   Iron deficiency anemia:  - Monitor, hgb stabilized at 9g/dl. 55% saturation, iron 104, TIBC low.   Hypokalemia:  - Supplemented and resolved.   PAF: Currently in NSR.  - Continue BB - Hold DOAC as needed for biopsy.  Morbid obesity: Body mass index is 51.91 kg/m.   Tyrone Nine, MD [facility] [email or web address] 04/23/2023, 12:55 PM

## 2023-04-23 NOTE — Progress Notes (Signed)
Chart/flowsheet/labs reviewed remotely. Cr relatively stable at 4.3. Pending renal biopsy. Only suggestion is maybe allowing for some degree of permissive HTN (would treat her BP if SBP >140) to help prevent "normotensive AKI". Will repeat her urine studies: UPC, UACR now that her BP is much better. Discussed with primary service. Please call with any questions/concerns and/or if patient needs to be physically seen by [place] over the weekend.  Anthony Sar, MD [facility]

## 2023-04-24 DIAGNOSIS — I161 Hypertensive emergency: Secondary | ICD-10-CM | POA: Diagnosis not present

## 2023-04-24 LAB — GLUCOSE, CAPILLARY
Glucose-Capillary: 96 mg/dL (ref 70–99)
Glucose-Capillary: 196 mg/dL — ABNORMAL HIGH (ref 70–99)
Glucose-Capillary: 184 mg/dL — ABNORMAL HIGH (ref 70–99)
Glucose-Capillary: 116 mg/dL — ABNORMAL HIGH (ref 70–99)

## 2023-04-24 LAB — RENAL FUNCTION PANEL
Albumin: <1.5 g/dL — ABNORMAL LOW (ref 3.5–5.0)
Anion gap: 8 (ref 5–15)
BUN: 27 mg/dL — ABNORMAL HIGH (ref 6–20)
CO2: 18 mmol/L — ABNORMAL LOW (ref 22–32)
Calcium: 8.2 mg/dL — ABNORMAL LOW (ref 8.9–10.3)
Chloride: 110 mmol/L (ref 98–111)
Creatinine, Ser: 4.12 mg/dL — ABNORMAL HIGH (ref 0.44–1.00)
GFR, Estimated: 13 mL/min — ABNORMAL LOW (ref >60)
Glucose, Bld: 94 mg/dL (ref 70–99)
Phosphorus: 4.2 mg/dL (ref 2.5–4.6)
Potassium: 3.5 mmol/L (ref 3.5–5.1)
Sodium: 136 mmol/L (ref 135–145)

## 2023-04-24 NOTE — Progress Notes (Signed)
Chart/labs/flowsheet reviewed remotely. Cr down to 4.1. UPC unchanged. MM panel pending. Renal biopsy pending (nephrotic, AKI, determine renal prognosis). No changes from a nephrology perspective for today. Plan dependent on renal biopsy findings. Will be seen physically tomorrow if patient hasn't gone for her biopsy yet. Please feel free to call with any questions/concerns in the interim or if patient needs to be physically seen today.  Anthony Sar, MD [facility]

## 2023-04-24 NOTE — Plan of Care (Signed)
  Problem: Fluid Volume: Goal: Ability to maintain a balanced intake and output will improve Outcome: Not Progressing   Problem: Metabolic: Goal: Ability to maintain appropriate glucose levels will improve Outcome: Not Progressing   

## 2023-04-24 NOTE — Consult Note (Signed)
Chief Complaint: AKI. Request is random renal biopsy for further evaluation   Referring Physician(s): Dr. Hazeline Junker   Supervising Physician: Marliss Coots  Patient Status: [facility] In-pt  History of Present Illness: Melissa [name] is a 47 y.o. female  ([facility]). History of DM, HTN, morbid obesity. Presented to the ED at [facility] on 10.20.24 with abdominal pain and nausea. Found to be hypertensive in AKI. Team is requesting a random renal biopsy for further evaluation.   Patient alert and laying in bed,calm. Endorses headache. Denies any fevers, headache, chest pain, SOB, cough, abdominal pain, nausea, vomiting or bleeding. Return precautions and treatment recommendations and follow-up discussed with the patient  who is agreeable with the plan.    Past Medical History:  Diagnosis Date   Anemia    Anxiety    Complication of anesthesia    Diabetes mellitus without complication (HCC)    Dysrhythmia    GERD (gastroesophageal reflux disease)    History of hiatal hernia    Hypertension    PONV (postoperative nausea and vomiting)     Past Surgical History:  Procedure Laterality Date   BIOPSY  02/02/2022   Procedure: BIOPSY;  Surgeon: Dolores Frame, MD;  Location: [facility];  Service: Gastroenterology;;   ESOPHAGOGASTRODUODENOSCOPY (EGD) WITH PROPOFOL N/A 02/02/2022   Procedure: ESOPHAGOGASTRODUODENOSCOPY (EGD) WITH PROPOFOL;  Surgeon: Dolores Frame, MD;  Location: [facility];  Service: Gastroenterology;  Laterality: N/A;  240   NO PAST SURGERIES      Allergies: Patient has no known allergies.  Medications: Prior to Admission medications   Medication Sig Start Date End Date Taking? Authorizing Provider  amLODipine (NORVASC) 10 MG tablet Take 10 mg by mouth daily. 12/16/21  Yes [provider]  cetirizine (ZYRTEC) 10 MG tablet Take 10 mg by mouth daily. 09/16/20  Yes [provider]  citalopram (CELEXA) 20 MG tablet Take 20 mg by mouth daily.  05/07/19  Yes [provider]  ELIQUIS 5 MG TABS tablet Take 5 mg by mouth 2 (two) times daily. 08/24/21  Yes [provider]  glimepiride (AMARYL) 4 MG tablet Take 4 mg by mouth every morning. 08/24/21  Yes [provider]  JENCYCLA 0.35 MG tablet Take 1 tablet by mouth daily. 11/25/21  Yes [provider]  metoprolol succinate (TOPROL-XL) 25 MG 24 hr tablet Take 25 mg by mouth daily. 11/23/21  Yes [provider]  pantoprazole (PROTONIX) 40 MG tablet Take 40 mg by mouth daily.   Yes [provider]  pioglitazone (ACTOS) 15 MG tablet Take 15 mg by mouth daily.   Yes [provider]  ferrous sulfate 325 (65 FE) MG tablet Take 325 mg by mouth daily. Patient not taking: Reported on 04/19/2023    [provider]     History reviewed. No pertinent family history.  Social History   Socioeconomic History   Marital status: Married    Spouse name: Not on file   Number of children: Not on file   Years of education: Not on file   Highest education level: Not on file  Occupational History   Not on file  Tobacco Use   Smoking status: Former    Types: Cigarettes    Passive exposure: Past   Smokeless tobacco: Former  Substance and Sexual Activity   Alcohol use: Yes    Comment: occasional drink   Drug use: No   Sexual activity: Never  Other Topics Concern   Not on file  Social History Narrative   Not on file   Social Determinants of Health   Financial Resource Strain: Low Risk  (11/10/2021)   Received from [facility]   Overall Financial Resource Strain (CARDIA)    Difficulty of Paying Living Expenses: Not very hard  Food Insecurity: No Food Insecurity (04/18/2023)   Hunger Vital Sign    Worried About Running Out of Food in the Last Year: Never true    Ran Out of Food in the Last Year: Never true  Transportation Needs: No Transportation Needs (04/18/2023)   PRAPARE - Scientist, research (physical sciences) (Medical): No    Lack of Transportation (Non-Medical): No  Physical Activity: Not on file  Stress: Not on file  Social Connections: Not on file    Review of Systems: A 12 point ROS discussed and pertinent positives are indicated in the HPI above.  All other systems are negative.  Review of Systems  Constitutional:  Negative for fatigue and fever.  HENT:  Negative for congestion.   Respiratory:  Negative for cough and shortness of breath.   Gastrointestinal:  Negative for abdominal pain, diarrhea, nausea and vomiting.  Neurological:  Positive for headaches.    Vital Signs: BP (!) 141/85   Pulse 84   Temp 98.8 F (37.1 C) (Oral)   Resp 18   Ht 5\' 5"  (1.651 m)   Wt (!) 311 lb 15.2 oz (141.5 kg)   LMP 04/03/2023   SpO2 96%   BMI 51.91 kg/m     Physical Exam Vitals and nursing note reviewed.  Constitutional:      Appearance: She is well-developed.  HENT:     Head: Normocephalic and atraumatic.  Eyes:     Conjunctiva/sclera: Conjunctivae normal.  Cardiovascular:     Rate and Rhythm: Normal rate and regular rhythm.  Pulmonary:     Effort: Pulmonary effort is normal.  Musculoskeletal:        General: Normal range of motion.     Cervical back: Normal range of motion.  Skin:    General: Skin is warm and dry.  Neurological:     General: No focal deficit present.     Mental Status: She is alert and oriented to person, place, and time.  Psychiatric:        Mood and Affect: Mood normal.        Behavior: Behavior normal.     Imaging: US RENAL  Result Date: 04/19/2023 CLINICAL DATA:  Acute kidney injury. EXAM: RENAL / URINARY TRACT ULTRASOUND COMPLETE COMPARISON:  None Available. FINDINGS: Large body habitus with decreased study resolution. Right Kidney: Renal measurements: 10.1 x 4.7 x 6.3 cm = volume: 157 mL. Echogenicity within normal limits. No mass or hydronephrosis visualized. Left Kidney: Renal measurements: 11.9 x 6.4 x 5.1 cm = volume: 203 mL.  Echogenicity within normal limits. No mass or hydronephrosis visualized. Bladder: Appears normal for degree of bladder distention. Other: None. IMPRESSION: Negative renal ultrasound. Electronically Signed   By: Sebastian Ache M.D.   On: 04/19/2023 16:51   US Abdomen Limited RUQ (LIVER/GB)  Result Date: 04/17/2023 CLINICAL DATA:  [id number] RUQ abdominal pain 151470.  Nausea/vomiting. EXAM: ULTRASOUND ABDOMEN LIMITED RIGHT UPPER QUADRANT COMPARISON:  None Available. FINDINGS: Gallbladder: No gallstones or wall thickening visualized. No sonographic Murphy sign noted by sonographer. Common bile duct: Diameter: Up to 4 mm.  No intrahepatic bile duct dilation. Liver: No focal lesion identified. Within normal limits in parenchymal echogenicity. Portal  vein is patent on color Doppler imaging with normal direction of blood flow towards the liver. Other: None. IMPRESSION: 1. Normal right upper quadrant sonogram. Electronically Signed   By: Jules Schick M.D.   On: 04/17/2023 14:07    Labs:  CBC: Recent Labs    04/18/23 0631 04/19/23 0902 04/20/23 0509 04/21/23 0457  WBC 8.9 7.2 6.4 6.4  HGB 9.5* 9.1* 8.8* 9.0*  HCT 30.6* 31.8* 30.4* 29.7*  PLT 340 335 291 283    COAGS: Recent Labs    04/22/23 0404  INR 1.0    BMP: Recent Labs    04/21/23 0457 04/22/23 0404 04/23/23 0528 04/24/23 0449  NA 135 135 136 136  K 3.6 3.6 3.7 3.5  CL 112* 110 111 110  CO2 18* 17* 17* 18*  GLUCOSE 87 108* 93 94  BUN 26* 26* 27* 27*  CALCIUM 7.8* 7.9* 8.3* 8.2*  CREATININE 4.44* 4.23* 4.30* 4.12*  GFRNONAA 12* 12* 12* 13*    LIVER FUNCTION TESTS: Recent Labs    04/17/23 1321 04/19/23 0902 04/21/23 0457 04/22/23 0404 04/23/23 0528 04/24/23 0449  BILITOT 0.5 0.4  --   --   --   --   AST 24 17  --   --   --   --   ALT 22 18  --   --   --   --   ALKPHOS 58 46  --   --   --   --   PROT 6.2* 4.8*  --   --   --   --   ALBUMIN 1.8* <1.5* <1.5* <1.5* <1.5* <1.5*      Assessment and Plan:  47 y.o  female inpatient. ([facility]). History of DM, HTN, morbid obesity. Presented to the ED at [facility] on 10.20.24 with abdominal pain and nausea. Found to be hypertensive in AKI. Team is requesting a random renal biopsy for further evaluation.   BUN 28, Cr 4.12, GFR < 13. INR from 10.25.24 1.0. Last dose of eliquis was on 10.25.24. All other labs and medications are within acceptable parameters. NKDA. Patient has been NPO since midnight.   Risks and benefits of random renal biopsy was discussed with the patient and/or patient's family including, but not limited to bleeding, infection, damage to adjacent structures or low yield requiring additional tests.  All of the questions were answered and there is agreement to proceed.  Consent signed and in chart.   Thank you for this interesting consult.  I greatly enjoyed meeting BROOKLYNE CORTER and look forward to participating in their care.  A copy of this report was sent to the requesting provider on this date.  Electronically Signed: Alene Mires, NP 04/24/2023, 6:05 PM   I spent a total of 40 Minutes    in face to face in clinical consultation, greater than 50% of which was counseling/coordinating care for random renal biopsy

## 2023-04-24 NOTE — Plan of Care (Signed)
IR has been following the patient remotely for random renal bx, scheduled for Monday 10/28.   Today patient's BP is controlled, VSS.  CBC and INR to be obtained tomorrow AM. CBC from 10/24 WBC 6.4, hgb 9.0, plt 283.  Not on AC/AP, last dose Eliquis on 10/25 1936 hrs.   Secure chat sent to [facility] RN/LPN and notified that [facility] team needs to arrange [facility] for the patient to be at [facility] IR by 9 am tomorrow.   Formal consult will be performed when patient arrives at [facility].  Please call IR for questions and concerns.   Lynann Bologna Polina Burmaster PA-C 04/24/2023 12:59 PM

## 2023-04-24 NOTE — Progress Notes (Signed)
[facility] PROGRESS NOTE  [name] (DOB: 1976/03/12) ZOX:[id number] PCP: Shelby Dubin, FNP  Brief Narrative: Melissa [name] is a 47 y.o. female with a history of T2DM, HTN, obesity who presented to the ED on 04/17/2023 with nausea, abdominal discomfort found to have AKI, severe HTN. Blood pressure improved with IV treatments and transitioned to oral agents. She remains hospitalized due to persistent creatinine elevation for which nephrology is consulted and further work up is ongoing. Renal biopsy is recommended. Holding DOAC in preparation for this on 10/28.   Subjective: No complaints this AM. Urine output stable, po intake stable.   Objective: BP 139/85 (BP Location: Right Arm)   Pulse 81   Temp 98.1 F (36.7 C) (Oral)   Resp 18   Ht 5\' 5"  (1.651 m)   Wt (!) 141.5 kg   LMP 04/03/2023   SpO2 93%   BMI 51.91 kg/m   Gen: No distress Pulm: Nonlabored  GI: Soft, NT, ND, +BS. Stable pitting abdominal wall edema Neuro: Alert and oriented. No new focal deficits. Ext: Warm, no deformities Skin: Increased 1+ pitting LE edema.    Assessment & Plan: HTN emergency: Improved, now on oral agents.  - Continue norvasc, metoprolol, prn's as ordered, will aim not to overcorrect into hypotension. Nephrology still following, ok with SBP 140.   - Avoiding ACE/ARB/ARNI with AKI as below  AKI on stage IIIb CKD, ATN: On baseline Cr ~1.4 which may be CKD III, though unclear. Rate of rise decreasing, but has not improved with supportive measures alone. Has concomitant NAGMA though not severe and no other indications for RRT at this time.  - SCr may have peaked 10/24, UOP picking up, edema actually worse and still spilling 12g proteinuria. Continues with plan for biopsy 10/28. - FENa 1.17%, ESR elevated, C3 and C4 not low, ANA ok; 12g proteinuria noted. ?nephrotic range proteinuria due to malignant HTN or FSGS, d/w nephrology who recommends biopsy. Will pursue 10/28 per IR schedule, holding  eliquis. NPO p MN and appropriate labs ordered for 10/28.  - U/S: No obstruction, no casts on UA.  - Manage BP appropriately, avoid hypotension/relative hypotension - Avoid contrast, ACE/ARB, etc. and NSAIDs.  - Since she's taking po again, can avoid IV, NAGMA remains, continuing bicarb tabs - Nephrology consult appreciated. SPEP pending. 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 Has proteinuria documented chronically.  - Discussed FMLA paperwork which I had at bedside. Offered to write for continuous absence from work estimated to return next Monday (11/4), though she states and papers confirm that paperwork is not required until 11/8. We will have much better detail on her anticipated absence/restrictions after biopsy results are back, so we will complete paperwork at that time.   T2DM: HbA1c 8.5% - Holding [place], SGLT2i with AKI.  - Continue SSI, largely at inpatient goal. - Urged her to follow up with PCP, will be seeking a different provider and endocrinology referral.   Nausea, vomiting: LFTs wnl, RUQ U/S wnl. - Continue diet as tolerated supported by prn antiemetics.   Iron deficiency anemia:  - Monitor, hgb stabilized at 9g/dl. 55% saturation, iron 104, TIBC low.   Hypokalemia:  - Supplemented and resolved.   PAF: Currently in NSR.  - Continue BB - Hold DOAC as needed for biopsy.  Morbid obesity: Body mass index is 51.91 kg/m.   Tyrone Nine, MD [facility] [email or web address] 04/24/2023, 10:45 AM

## 2023-04-25 ENCOUNTER — Inpatient Hospital Stay (HOSPITAL_COMMUNITY): Payer: No Typology Code available for payment source

## 2023-04-25 ENCOUNTER — Ambulatory Visit (HOSPITAL_COMMUNITY)
Admit: 2023-04-25 | Discharge: 2023-04-25 | Disposition: A | Discharge status: Home / Self Care | Payer: No Typology Code available for payment source | Source: Home / Self Care | Attending: Family Medicine | Admitting: Family Medicine

## 2023-04-25 ENCOUNTER — Encounter (HOSPITAL_COMMUNITY): Payer: Self-pay

## 2023-04-25 DIAGNOSIS — Z539 Procedure and treatment not carried out, unspecified reason: Secondary | ICD-10-CM | POA: Insufficient documentation

## 2023-04-25 DIAGNOSIS — N179 Acute kidney failure, unspecified: Secondary | ICD-10-CM | POA: Insufficient documentation

## 2023-04-25 DIAGNOSIS — I161 Hypertensive emergency: Secondary | ICD-10-CM | POA: Diagnosis not present

## 2023-04-25 LAB — MULTIPLE MYELOMA PANEL, SERUM
Albumin SerPl Elph-Mcnc: 1.4 g/dL — ABNORMAL LOW (ref 2.9–4.4)
Albumin/Glob SerPl: 0.5 — ABNORMAL LOW (ref 0.7–1.7)
Alpha 1: 0.1 g/dL (ref 0.0–0.4)
Alpha2 Glob SerPl Elph-Mcnc: 1.5 g/dL — ABNORMAL HIGH (ref 0.4–1.0)
B-Globulin SerPl Elph-Mcnc: 0.9 g/dL (ref 0.7–1.3)
Gamma Glob SerPl Elph-Mcnc: 0.3 g/dL — ABNORMAL LOW (ref 0.4–1.8)
Globulin, Total: 2.9 g/dL (ref 2.2–3.9)
IgA: 229 mg/dL (ref 87–352)
IgG (Immunoglobin G), Serum: 468 mg/dL — ABNORMAL LOW (ref 586–1602)
IgM (Immunoglobulin M), Srm: 79 mg/dL (ref 26–217)
Total Protein ELP: 4.3 g/dL — ABNORMAL LOW (ref 6.0–8.5)

## 2023-04-25 LAB — RENAL FUNCTION PANEL
Albumin: <1.5 g/dL — ABNORMAL LOW (ref 3.5–5.0)
Anion gap: 11 (ref 5–15)
BUN: 28 mg/dL — ABNORMAL HIGH (ref 6–20)
CO2: 16 mmol/L — ABNORMAL LOW (ref 22–32)
Calcium: 8.3 mg/dL — ABNORMAL LOW (ref 8.9–10.3)
Chloride: 110 mmol/L (ref 98–111)
Creatinine, Ser: 4.12 mg/dL — ABNORMAL HIGH (ref 0.44–1.00)
GFR, Estimated: 13 mL/min — ABNORMAL LOW (ref >60)
Glucose, Bld: 92 mg/dL (ref 70–99)
Phosphorus: 3.8 mg/dL (ref 2.5–4.6)
Potassium: 3.7 mmol/L (ref 3.5–5.1)
Sodium: 137 mmol/L (ref 135–145)

## 2023-04-25 LAB — CBC
HCT: 30.9 % — ABNORMAL LOW (ref 36.0–46.0)
Hemoglobin: 9.1 g/dL — ABNORMAL LOW (ref 12.0–15.0)
MCH: 23 pg — ABNORMAL LOW (ref 26.0–34.0)
MCHC: 29.4 g/dL — ABNORMAL LOW (ref 30.0–36.0)
MCV: 78 fL — ABNORMAL LOW (ref 80.0–100.0)
Platelets: 290 10*3/uL (ref 150–400)
RBC: 3.96 MIL/uL (ref 3.87–5.11)
RDW: 19.3 % — ABNORMAL HIGH (ref 11.5–15.5)
WBC: 5.3 10*3/uL (ref 4.0–10.5)
nRBC: 0 % (ref 0.0–0.2)

## 2023-04-25 LAB — PROTIME-INR
INR: 1 (ref 0.8–1.2)
Prothrombin Time: 13.2 s (ref 11.4–15.2)

## 2023-04-25 LAB — ALDOSTERONE + RENIN ACTIVITY W/ RATIO
ALDO / PRA Ratio: 27.5 (ref 0.0–30.0)
Aldosterone: 6.8 ng/dL (ref 0.0–30.0)
PRA LC/MS/MS: 0.247 ng/mL/h (ref 0.167–5.380)

## 2023-04-25 LAB — GLUCOSE, CAPILLARY
Glucose-Capillary: 110 mg/dL — ABNORMAL HIGH (ref 70–99)
Glucose-Capillary: 112 mg/dL — ABNORMAL HIGH (ref 70–99)
Glucose-Capillary: 121 mg/dL — ABNORMAL HIGH (ref 70–99)
Glucose-Capillary: 159 mg/dL — ABNORMAL HIGH (ref 70–99)

## 2023-04-25 MED ORDER — FENTANYL CITRATE (PF) 100 MCG/2ML IJ SOLN
INTRAMUSCULAR | Status: AC
  Filled 2023-04-25: qty 4

## 2023-04-25 MED ORDER — ONDANSETRON HCL 4 MG/2ML IJ SOLN
INTRAMUSCULAR | Status: AC
  Filled 2023-04-25: qty 2

## 2023-04-25 MED ORDER — MIDAZOLAM HCL 2 MG/2ML IJ SOLN
INTRAMUSCULAR | Status: AC
  Filled 2023-04-25: qty 4

## 2023-04-25 MED ORDER — ONDANSETRON HCL 4 MG/2ML IJ SOLN
INTRAMUSCULAR | Status: DC | PRN
  Administered 2023-04-25: 4 mg via INTRAVENOUS

## 2023-04-25 MED ORDER — HYDRALAZINE HCL 20 MG/ML IJ SOLN
INTRAMUSCULAR | Status: AC
  Filled 2023-04-25: qty 1

## 2023-04-25 MED ORDER — TRAMADOL HCL 50 MG PO TABS
50.0000 mg | ORAL_TABLET | Freq: Two times a day (BID) | ORAL | Status: DC | PRN
  Filled 2023-04-25: qty 1

## 2023-04-25 MED ORDER — HYDRALAZINE HCL 20 MG/ML IJ SOLN
INTRAMUSCULAR | Status: DC | PRN
  Administered 2023-04-25 (×2): 10 mg via INTRAVENOUS

## 2023-04-25 MED ORDER — ACETAMINOPHEN 500 MG PO TABS
ORAL_TABLET | ORAL | Status: AC
Start: 2023-04-25 — End: ?
  Filled 2023-04-25: qty 2

## 2023-04-25 MED ORDER — ACETAMINOPHEN 325 MG PO TABS
ORAL_TABLET | ORAL | Status: DC | PRN
  Administered 2023-04-25: 1000 mg via ORAL

## 2023-04-25 MED ORDER — MIDAZOLAM HCL 2 MG/2ML IJ SOLN
INTRAMUSCULAR | Status: DC | PRN
  Administered 2023-04-25: .5 mg via INTRAVENOUS

## 2023-04-25 NOTE — Progress Notes (Addendum)
[facility] Progress Note   Assessment/ Plan:    AKI/CKD stage IIIb - likely multifactorial with pre-renal initially given N/V and poor po intake but then complicated by hypertensive urgency.  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  Renal US without obstruction.  Underlying CKD likely due to poorly controlled DM and HTN.  She has had proteinuria of >300 for several years.  No ACE/ARB or SGLT-2 inhibitor for now given AKI.   -ANA, ANCA negative. Multiple myeloma panel pending. Rechecked [facility] over the weekend, still in nephrotic range despite BP better controlled. Open for renal biopsy today with IR @ [facility] -Cr stable 4.1, nonoliguric. No indication for renal replacement therapy. Plan to be dictated by renal biopsy report. Nephrotic range proteinuria - likely due to diabetic nephropathy.  Plan as above Hypertensive urgency - BP improved Intractable N/V - currently resolved.   Anemia - Likely related to CKD.  Iron replete 10/23. Hgb stable 9.1. Hold off on ESA for now. MM panel pending Hypokalemia - likely due to GI losses but will check renin and aldo levels-pending. K stable DM - per primary Morbid obesity  OSA - not on CPAP CKD stge IIIb vs progressive CKD stage IV - she will need to follow up with our office in [place] after discharge (she lives in [place]).   Anthony Sar, MD [facility]   Subjective:    Patient seen and examined bedside. Open for renal biopsy at [facility] today. No complaints this AM. Discussed renal biopsy process in detail. Understandably, she is nervous about the procedure.   Objective:   BP (!) 153/97 (BP Location: Right Arm)   Pulse 85   Temp 97.8 F (36.6 C)   Resp 18   Ht 5\' 5"  (1.651 m)   Wt (!) 141.5 kg   LMP 04/03/2023   SpO2 95%   BMI 51.91 kg/m   Intake/Output Summary (Last 24 hours) at 04/25/2023 0726 Last data filed at 04/25/2023 0431 Gross per 24 hour  Intake  340 ml  Output 1700 ml  Net -1360 ml   Weight change:   Physical Exam: Gen:NAD, sitting up in bed CVS: RRR Resp: clear Abd: soft Ext: trace pitting edema bl Les Neuro: awake, alert  Imaging: No results found.  Labs: BMET Recent Labs  Lab 04/19/23 0902 04/20/23 0509 04/21/23 0457 04/22/23 0404 04/23/23 0528 04/24/23 0449 04/25/23 0413  NA 137 135 135 135 136 136 137  K 3.4* 3.6 3.6 3.6 3.7 3.5 3.7  CL 113* 111 112* 110 111 110 110  CO2 16* 18* 18* 17* 17* 18* 16*  GLUCOSE 123* 90 87 108* 93 94 92  BUN 22* 24* 26* 26* 27* 27* 28*  CREATININE 4.06* 4.15* 4.44* 4.23* 4.30* 4.12* 4.12*  CALCIUM 7.9* 7.8* 7.8* 7.9* 8.3* 8.2* 8.3*  PHOS  --   --  3.3 3.3 3.7 4.2 3.8   CBC Recent Labs  Lab 04/19/23 0902 04/20/23 0509 04/21/23 0457 04/25/23 0413  WBC 7.2 6.4 6.4 5.3  HGB 9.1* 8.8* 9.0* 9.1*  HCT 31.8* 30.4* 29.7* 30.9*  MCV 81.5 79.6* 78.2* 78.0*  PLT 335 291 283 290    Medications:     amLODipine  10 mg Oral Daily   citalopram  20 mg Oral Daily   insulin aspart  0-15 Units Subcutaneous TID WC   melatonin  6 mg Oral QHS   metoprolol succinate  25 mg Oral Daily  pantoprazole  40 mg Oral Daily   sodium bicarbonate  650 mg Oral BID

## 2023-04-25 NOTE — Plan of Care (Signed)

## 2023-04-25 NOTE — Plan of Care (Signed)
  Problem: Coping: Goal: Ability to adjust to condition or change in health will improve Outcome: Progressing   Problem: Metabolic: Goal: Ability to maintain appropriate glucose levels will improve Outcome: Progressing   

## 2023-04-25 NOTE — Progress Notes (Signed)
Called [facility], transportation confirmed for today.

## 2023-04-25 NOTE — Sedation Documentation (Signed)
MD Suttle at bedside, notified pt's BP increased to SBP 180's when prone. Ordered 10 mg hydralazine.

## 2023-04-25 NOTE — Progress Notes (Signed)
[facility] PROGRESS NOTE  RAMISA IBBOTSON (DOB: [birth year]) UUV:[id number] PCP: Shelby Dubin, FNP  Brief Narrative: Melissa [name] is a 47 y.o. female with a history of T2DM, HTN, obesity who presented to the ED on 04/17/2023 with nausea, abdominal discomfort found to have AKI, severe HTN. Blood pressure improved with IV treatments and transitioned to oral agents. She remains hospitalized due to persistent creatinine elevation for which nephrology is consulted and further work up is ongoing. Renal biopsy is recommended. Holding DOAC in preparation for this on 10/28.   Subjective: Reports left side predominant headache which she gets at home occasionally, improved with tylenol but still somewhat nagging. Declines tramadol or other Tx at this time. Off to biopsy this AM.   Objective: BP (!) 153/97 (BP Location: Right Arm)   Pulse 85   Temp 97.8 F (36.6 C)   Resp 18   Ht 5\' 5"  (1.651 m)   Wt (!) 141.5 kg   LMP 04/03/2023   SpO2 95%   BMI 51.91 kg/m   No distress Nonlabored No focal deficits  Assessment & Plan: HTN emergency: Improved, now on oral agents.  - Continue norvasc, metoprolol, prn's as ordered, will aim not to overcorrect into hypotension. Nephrology still following, ok with SBP 140.   - Avoiding ACE/ARB/ARNI with AKI as below  AKI on stage IIIb CKD, ATN: On baseline Cr ~1.4 which may be CKD III, though unclear. Rate of rise decreasing, but has not improved with supportive measures alone. Has concomitant NAGMA though not severe and no other indications for RRT at this time.  - SCr may have peaked 10/24, still with nephrotic (12g) proteinuria despite improvement in BP. Plan for biopsy 10/28, further care to be guided by results. - FENa 1.17%, ESR elevated, C3 and C4 not low, ANA and ANCA negative, SPEP pending. 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 Has proteinuria documented chronically.  - Restart eliquis per IR after biopsy.  - U/S: No obstruction,  no casts on UA.  - Manage BP appropriately, avoid hypotension/relative hypotension - Avoid contrast, ACE/ARB, etc. and NSAIDs.  - Nephrology consult appreciated. SPEP pending.  - Discussed FMLA paperwork which I had at bedside. Offered to write for continuous absence from work estimated to return next Monday (11/4), though she states and papers confirm that paperwork is not required until 11/8. We will have much better detail on her anticipated absence/restrictions after biopsy results are back, so we will complete paperwork at that time.   T2DM: HbA1c 8.5% - Holding [place], SGLT2i with AKI.  - Continue SSI, largely at inpatient goal. - Urged her to follow up with PCP, will be seeking a different provider and endocrinology referral.   Nausea, vomiting: LFTs wnl, RUQ U/S wnl. - Continue diet as tolerated supported by prn antiemetics.   Iron deficiency anemia:  - Monitor, hgb stabilized at 9g/dl. 55% saturation, iron 104, TIBC low.   Hypokalemia:  - Supplemented and resolved.   Headache: Not associated with severe HTN, no red flags at this time. Will continue tylenol, avoid opioids as these cause nausea and avoid NSAIDs of course. Continue monitoring.   PAF: Currently in NSR.  - Continue BB - Hold DOAC as needed for biopsy.  Morbid obesity: Body mass index is 51.91 kg/m.   Tyrone Nine, MD [facility] [email or web address] 04/25/2023, 9:11 AM

## 2023-04-25 NOTE — Plan of Care (Signed)
  Problem: Metabolic: Goal: Ability to maintain appropriate glucose levels will improve Outcome: Not Progressing   Problem: Tissue Perfusion: Goal: Adequacy of tissue perfusion will improve Outcome: Not Progressing   Problem: Clinical Measurements: Goal: Diagnostic test results will improve Outcome: Not Progressing

## 2023-04-26 DIAGNOSIS — I161 Hypertensive emergency: Secondary | ICD-10-CM | POA: Diagnosis not present

## 2023-04-26 LAB — RENAL FUNCTION PANEL
Albumin: <1.5 g/dL — ABNORMAL LOW (ref 3.5–5.0)
Anion gap: 8 (ref 5–15)
BUN: 28 mg/dL — ABNORMAL HIGH (ref 6–20)
CO2: 18 mmol/L — ABNORMAL LOW (ref 22–32)
Calcium: 8.4 mg/dL — ABNORMAL LOW (ref 8.9–10.3)
Chloride: 111 mmol/L (ref 98–111)
Creatinine, Ser: 4.02 mg/dL — ABNORMAL HIGH (ref 0.44–1.00)
GFR, Estimated: 13 mL/min — ABNORMAL LOW (ref >60)
Glucose, Bld: 90 mg/dL (ref 70–99)
Phosphorus: 4.3 mg/dL (ref 2.5–4.6)
Potassium: 3.7 mmol/L (ref 3.5–5.1)
Sodium: 137 mmol/L (ref 135–145)

## 2023-04-26 LAB — GLUCOSE, CAPILLARY
Glucose-Capillary: 106 mg/dL — ABNORMAL HIGH (ref 70–99)
Glucose-Capillary: 152 mg/dL — ABNORMAL HIGH (ref 70–99)
Glucose-Capillary: 145 mg/dL — ABNORMAL HIGH (ref 70–99)
Glucose-Capillary: 170 mg/dL — ABNORMAL HIGH (ref 70–99)

## 2023-04-26 MED ORDER — HYDRALAZINE HCL 25 MG PO TABS
25.0000 mg | ORAL_TABLET | Freq: Four times a day (QID) | ORAL | Status: DC
  Administered 2023-04-26 – 2023-04-27 (×5): 25 mg via ORAL
  Filled 2023-04-26 (×5): qty 1

## 2023-04-26 MED ORDER — METOPROLOL SUCCINATE ER 50 MG PO TB24
50.0000 mg | ORAL_TABLET | Freq: Every day | ORAL | Status: DC
  Administered 2023-04-26 – 2023-04-28 (×3): 50 mg via ORAL
  Filled 2023-04-26 (×3): qty 1

## 2023-04-26 NOTE — Plan of Care (Signed)

## 2023-04-26 NOTE — Progress Notes (Addendum)
[facility] NEPHROLOGY PROGRESS NOTE  Assessment/ Plan:  # AKI/CKD stage IIIb - likely multifactorial with pre-renal initially given N/V and poor po intake but then complicated by hypertensive urgency.  She could also possibly have TMA due to malignant HTN as well as dysautoregulation after BP controlled.  Renal US without obstruction.  Underlying CKD likely due to poorly controlled DM and HTN.  She has had proteinuria of >300 for several years.  No ACE/ARB or SGLT-2 inhibitor for now given AKI.   -ANA, ANCA, MM panel negative.  -Plan for kidney biopsy, noted it was canceled by IR yesterday because of elevated BP.  Hopefully IR will be able to do kidney biopsy in the next 1 to 2 days with plan to follow outpatient. -She is nonoliguric and serum creatinine level trending down.  No overt signs or symptoms of uremia, no need for dialysis.  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  Plan as above.  # Hypertensive urgency: Currently on amlodipine.  Metoprolol dose increased to 50 mg and added hydralazine by primary team.  Monitor BP.  May need IV as needed antihypertensives before the biopsy.  # Anemia - Likely related to CKD.  Iron replete 10/23. Hgb stable, monitor lab.  # Hypokalemia - likely due to GI losses.  Potassium level improved.  # CKD stge IIIb vs progressive CKD stage IV - she will need to follow up with our office in [place] after discharge (she lives in [place]).   Subjective: Seen and examined bedside.  Denies nausea, vomiting, chest pain, shortness of breath.  Urine output is not measured.  She did not get biopsy yesterday because of high BP. Objective Vital signs in last 24 hours: Vitals:   04/25/23 1516 04/25/23 2201 04/26/23 0123 04/26/23 0922  BP: (!) 167/71 (!) 173/81 (!) 150/88 (!) 182/85  Pulse: 78 89 86 82  Resp: 20 19 18 16   Temp: 98.1 F (36.7 C) 97.9 F (36.6 C)  98.6 F (37 C)  TempSrc: Oral Oral  Oral  SpO2: 99% 98%  96% 100%  Weight:      Height:       Weight change:  No intake or output data in the 24 hours ending 04/26/23 0941     Labs: RENAL PANEL Recent Labs  Lab 04/20/23 0509 04/21/23 0457 04/22/23 0404 04/23/23 0528 04/24/23 0449 04/25/23 0413 04/26/23 0416  NA 135   < > 135 136 136 137 137  K 3.6   < > 3.6 3.7 3.5 3.7 3.7  CL 111   < > 110 111 110 110 111  CO2 18*   < > 17* 17* 18* 16* 18*  GLUCOSE 90   < > 108* 93 94 92 90  BUN 24*   < > 26* 27* 27* 28* 28*  CREATININE 4.15*   < > 4.23* 4.30* 4.12* 4.12* 4.02*  CALCIUM 7.8*   < > 7.9* 8.3* 8.2* 8.3* 8.4*  MG 1.9  --   --   --   --   --   --   PHOS  --    < > 3.3 3.7 4.2 3.8 4.3  ALBUMIN  --    < > <1.5* <1.5* <1.5* <1.5* <1.5*   < > = values in this interval not displayed.    Liver Function Tests: Recent Labs  Lab 04/24/23 0449 04/25/23 0413 04/26/23 0416  ALBUMIN <1.5* <1.5* <1.5*   No results for input(s): "LIPASE", "AMYLASE" in the last  168 hours. No results for input(s): "AMMONIA" in the last 168 hours. CBC: Recent Labs    04/18/23 0631 04/19/23 0902 04/20/23 0509 04/20/23 1239 04/21/23 0457 04/25/23 0413  HGB 9.5* 9.1* 8.8*  --  9.0* 9.1*  MCV 77.7* 81.5 79.6*  --  78.2* 78.0*  VITAMINB12 1,021*  --   --   --   --   --   FOLATE 7.5  --   --   --   --   --   FERRITIN 7*  --   --   --   --   --   TIBC 196*  --   --  190*  --   --   IRON 29  --   --  104  --   --   RETICCTPCT 2.3  --   --   --   --   --     Cardiac Enzymes: No results for input(s): "CKTOTAL", "CKMB", "CKMBINDEX", "TROPONINI" in the last 168 hours. CBG: Recent Labs  Lab 04/25/23 0757 04/25/23 1330 04/25/23 1639 04/25/23 2040 04/26/23 0745  GLUCAP 110* 112* 121* 159* 106*    Iron Studies: No results for input(s): "IRON", "TIBC", "TRANSFERRIN", "FERRITIN" in the last 72 hours. Studies/Results: No results found.  Medications: Infusions:   Scheduled Medications:  amLODipine  10 mg Oral Daily   citalopram  20 mg Oral Daily    hydrALAZINE  25 mg Oral Q6H   insulin aspart  0-15 Units Subcutaneous TID WC   melatonin  6 mg Oral QHS   metoprolol succinate  50 mg Oral Daily   pantoprazole  40 mg Oral Daily   sodium bicarbonate  650 mg Oral BID    have reviewed scheduled and prn medications.  Physical Exam: General:NAD, comfortable Heart:RRR, s1s2 nl Lungs:clear b/l, no crackle Abdomen:soft, Non-tender, non-distended Extremities: Only trace PERI- ankle edema Neurology: Alert, awake and following commands  Nameer Summer Prasad Toshiye Kever 04/26/2023,9:41 AM  LOS: 9 days

## 2023-04-26 NOTE — Progress Notes (Signed)
     Was tentatively scheduled for retry at Random renal biopsy today at [facility] IR BP 182/95 this am  Too high to safely move ahead with this procedure today  We will check BP in am

## 2023-04-26 NOTE — Progress Notes (Signed)
[facility] PROGRESS NOTE  Melissa [name] (DOB: 1975/11/25) WUJ:[id number] PCP: Shelby Dubin, FNP  Brief Narrative: Melissa [name] is a 47 y.o. female with a history of T2DM, HTN, obesity who presented to the ED on 04/17/2023 with nausea, abdominal discomfort found to have AKI, severe HTN. Blood pressure improved with IV treatments and transitioned to oral agents. She remains hospitalized due to persistent creatinine elevation for which nephrology is consulted and further work up is ongoing. Renal biopsy attempted 10/28 but did not tolerate prone positioning, elevated BP. Medications titrated and plan for retry renal biopsy 11/1. Holding eliquis in preparation for this.  Subjective: No complaints today.   Objective: BP (!) 147/76 (BP Location: Left Wrist)   Pulse 76   Temp 98.6 F (37 C) (Oral)   Resp 16   Ht 5\' 5"  (1.651 m)   Wt (!) 141.5 kg   LMP 04/03/2023   SpO2 98%   BMI 51.91 kg/m   Pleasant female in no distress Clear, nonlabored 1+ pitting LE edema Alert, oriented, nonfocal  Assessment & Plan: HTN emergency: Improved, now on oral agents.  - Continue norvasc, metoprolol, increased dose today. Add hydralazine 25mg  q6h to confirm tolerance with possibility to titrate upward as needed.   - Avoiding ACE/ARB/ARNI, diuretics with AKI as below  AKI on stage IIIb CKD, ATN: On baseline Cr ~1.4 which may be CKD III, though unclear. Rate of rise decreasing, but has not improved with supportive measures alone. Has concomitant NAGMA though not severe and no other indications for RRT at this time.  - SCr peaked 10/24, still with nephrotic (12g) proteinuria despite improvement in BP, UOP. Plan to retry renal biopsy 10/30. Plan for treatment pending this result. The patient still has increasing edema due to 12g/day proteinuria.  - FENa 1.17%, ESR elevated, C3 and C4 not low, ANA, ANCA, SPEP negative. 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 Has proteinuria documented  chronically.  - Restart eliquis per IR after biopsy.  - U/S: No obstruction, no casts on UA.   - Avoid contrast, ACE/ARB, etc. and NSAIDs.   T2DM: HbA1c 8.5% - Holding [place], SGLT2i with AKI.  - Continue SSI. - Urged her to follow up with PCP, will be seeking a different provider and endocrinology referral.   Nausea, vomiting: LFTs wnl, RUQ U/S wnl. - Continue diet as tolerated supported by prn antiemetics.   Iron deficiency anemia:  - Monitor, hgb stabilized at 9g/dl. 55% saturation, iron 104, TIBC low.   Hypokalemia:  - Supplemented and resolved.   PAF: Currently in NSR.  - Continue BB - Hold DOAC as needed for biopsy.  Morbid obesity: Body mass index is 51.91 kg/m.   Tyrone Nine, MD [facility] [email or web address] 04/26/2023, 5:35 PM

## 2023-04-26 NOTE — Progress Notes (Signed)
Pt required IV antihypertensive. No other acute events over night. Kellogg RN

## 2023-04-27 DIAGNOSIS — N179 Acute kidney failure, unspecified: Secondary | ICD-10-CM | POA: Diagnosis not present

## 2023-04-27 DIAGNOSIS — I161 Hypertensive emergency: Secondary | ICD-10-CM | POA: Diagnosis not present

## 2023-04-27 DIAGNOSIS — I4891 Unspecified atrial fibrillation: Secondary | ICD-10-CM | POA: Diagnosis not present

## 2023-04-27 LAB — GLUCOSE, CAPILLARY
Glucose-Capillary: 112 mg/dL — ABNORMAL HIGH (ref 70–99)
Glucose-Capillary: 208 mg/dL — ABNORMAL HIGH (ref 70–99)
Glucose-Capillary: 181 mg/dL — ABNORMAL HIGH (ref 70–99)
Glucose-Capillary: 91 mg/dL (ref 70–99)
Glucose-Capillary: 180 mg/dL — ABNORMAL HIGH (ref 70–99)

## 2023-04-27 LAB — RENAL FUNCTION PANEL
Albumin: <1.5 g/dL — ABNORMAL LOW (ref 3.5–5.0)
Anion gap: 8 (ref 5–15)
BUN: 29 mg/dL — ABNORMAL HIGH (ref 6–20)
CO2: 19 mmol/L — ABNORMAL LOW (ref 22–32)
Calcium: 8.2 mg/dL — ABNORMAL LOW (ref 8.9–10.3)
Chloride: 107 mmol/L (ref 98–111)
Creatinine, Ser: 4.2 mg/dL — ABNORMAL HIGH (ref 0.44–1.00)
GFR, Estimated: 12 mL/min — ABNORMAL LOW (ref >60)
Glucose, Bld: 100 mg/dL — ABNORMAL HIGH (ref 70–99)
Phosphorus: 4.2 mg/dL (ref 2.5–4.6)
Potassium: 4 mmol/L (ref 3.5–5.1)
Sodium: 134 mmol/L — ABNORMAL LOW (ref 135–145)

## 2023-04-27 LAB — MICROALBUMIN / CREATININE URINE RATIO
Creatinine, Urine: 61.4 mg/dL
Microalb Creat Ratio: 8103 mg/g{creat} — ABNORMAL HIGH (ref 0–29)
Microalb, Ur: 4975 ug/mL — ABNORMAL HIGH

## 2023-04-27 MED ORDER — HYDRALAZINE HCL 50 MG PO TABS
50.0000 mg | ORAL_TABLET | Freq: Three times a day (TID) | ORAL | Status: DC
  Administered 2023-04-27 – 2023-04-28 (×3): 50 mg via ORAL
  Filled 2023-04-27 (×3): qty 1

## 2023-04-27 NOTE — Plan of Care (Signed)
  Problem: Coping: Goal: Ability to adjust to condition or change in health will improve Outcome: Progressing   Problem: Skin Integrity: Goal: Risk for impaired skin integrity will decrease Outcome: Progressing   Problem: Activity: Goal: Risk for activity intolerance will decrease Outcome: Progressing   

## 2023-04-27 NOTE — Progress Notes (Signed)
Pt rested during night.  She did not request any assistance during the night.

## 2023-04-27 NOTE — Progress Notes (Addendum)
PROGRESS NOTE  Melissa [name] ZOX:[id number] DOB: [birth year] DOA: 04/17/2023 PCP: Shelby Dubin, FNP  Brief History:   Melissa [name] is a 47 y.o. female with a history of T2DM, HTN, obesity who presented to the ED on 04/17/2023 with nausea, abdominal discomfort found to have AKI, severe HTN. Blood pressure improved with IV treatments and transitioned to oral agents. She remains hospitalized due to persistent creatinine elevation for which nephrology is consulted and further work up is ongoing. Renal biopsy attempted 10/28 but did not tolerate prone positioning and elevated BP. Medications titrated and plan for retry renal biopsy 11/1. Holding eliquis in preparation for this. In order to avoid further delays, nephrology and IR advocated for patient transfer to [facility] to receive remainder of her care.   Assessment/Plan: HTN emergency:  Improved, now on oral agents.  - Continue norvasc, metoprolol, increased dose  - increase hydralazine 50 mg q 8   - Avoiding ACE/ARB/ARNI, diuretics with AKI as below -goal BP <150/90 for renal biopsy--discussed with IR, Dr. Milford Cage   AKI on stage IIIb CKD, ATN:  -On baseline Cr ~1.4 which may be CKD III, though unclear. Rate of rise decreasing, but has not improved with supportive measures alone. Has concomitant NAGMA though not severe and no other indications for RRT at this time.  - SCr peaked 10/24, still with nephrotic (12g) proteinuria despite improvement in BP, UOP. Plan to retry renal biopsy 10/30. Plan for treatment pending this result. The patient still has increasing edema due to 12g/day proteinuria.  - FENa 1.17%, ESR elevated, C3 and C4 not low, ANA, ANCA, SPEP negative. 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 Has proteinuria documented chronically.  - Restart eliquis per IR after biopsy.  - U/S: No obstruction, no casts on UA.   - Avoid contrast, ACE/ARB, etc. and NSAIDs.  -discussed with renal, Dr. Ronalee Belts   T2DM:  HbA1c 8.5% - Holding [employer], SGLT2i with AKI.  - Continue SSI. - Urged her to follow up with PCP, will be seeking a different provider and endocrinology referral.    Nausea, vomiting:  -LFTs wnl, RUQ U/S wnl. - Continue diet as tolerated supported by prn antiemetics.  - now tolerating diet   Iron deficiency anemia:  - Monitor, hgb stabilized at 9g/dl. 55% saturation, iron 104, TIBC low.    Hypokalemia:  - Supplemented and resolved.    PAF: Currently in NSR.  - Continue BB - Hold DOAC as needed for biopsy.   Morbid obesity:  -Body mass index is 51.91 kg/m.  -lifestyle modification           Family Communication: no  Family at bedside  Consultants:  IR, nephrology  Code Status:  FULL   DVT Prophylaxis: apixaban on hold   Procedures: As Listed in Progress Note Above  Antibiotics: None    Total time spent 50 minutes.  Greater than 50% spent face to face counseling and coordinating care.    Subjective: Patient denies fevers, chills, headache, chest pain, dyspnea, nausea, vomiting, diarrhea, abdominal pain, dysuria, hematuria, hematochezia, and melena.   Objective: Vitals:   04/26/23 1130 04/26/23 1452 04/26/23 2027 04/27/23 0605  BP: (!) 167/84 (!) 147/76 (!) 165/87 (!) 149/79  Pulse: 75 76 78   Resp:  16    Temp:  98.6 F (37 C) 99.2 F (37.3 C)   TempSrc:  Oral Oral   SpO2:  98% 100%   Weight:  Height:        Intake/Output Summary (Last 24 hours) at 04/27/2023 1133 Last data filed at 04/26/2023 1900 Gross per 24 hour  Intake 480 ml  Output --  Net 480 ml   Weight change:  Exam:  General:  Pt is alert, follows commands appropriately, not in acute distress HEENT: No icterus, No thrush, No neck mass, [place]/AT Cardiovascular: RRR, S1/S2, no rubs, no gallops Respiratory: CTA bilaterally, no wheezing, no crackles, no rhonchi Abdomen: Soft/+BS, non tender, non distended, no guarding Extremities: No edema, No lymphangitis, No petechiae, No  rashes, no synovitis   Data Reviewed: I have personally reviewed following labs and imaging studies Basic Metabolic Panel: Recent Labs  Lab 04/23/23 0528 04/24/23 0449 04/25/23 0413 04/26/23 0416 04/27/23 0411  NA 136 136 137 137 134*  K 3.7 3.5 3.7 3.7 4.0  CL 111 110 110 111 107  CO2 17* 18* 16* 18* 19*  GLUCOSE 93 94 92 90 100*  BUN 27* 27* 28* 28* 29*  CREATININE 4.30* 4.12* 4.12* 4.02* 4.20*  CALCIUM 8.3* 8.2* 8.3* 8.4* 8.2*  PHOS 3.7 4.2 3.8 4.3 4.2   Liver Function Tests: Recent Labs  Lab 04/23/23 0528 04/24/23 0449 04/25/23 0413 04/26/23 0416 04/27/23 0411  ALBUMIN <1.5* <1.5* <1.5* <1.5* <1.5*   No results for input(s): "LIPASE", "AMYLASE" in the last 168 hours. No results for input(s): "AMMONIA" in the last 168 hours. Coagulation Profile: Recent Labs  Lab 04/22/23 0404 04/25/23 0413  INR 1.0 1.0   CBC: Recent Labs  Lab 04/21/23 0457 04/25/23 0413  WBC 6.4 5.3  HGB 9.0* 9.1*  HCT 29.7* 30.9*  MCV 78.2* 78.0*  PLT 283 290   Cardiac Enzymes: No results for input(s): "CKTOTAL", "CKMB", "CKMBINDEX", "TROPONINI" in the last 168 hours. BNP: Invalid input(s): "POCBNP" CBG: Recent Labs  Lab 04/26/23 1131 04/26/23 1651 04/26/23 2125 04/27/23 0709 04/27/23 1121  GLUCAP 152* 145* 170* 112* 208*   HbA1C: No results for input(s): "HGBA1C" in the last 72 hours. Urine analysis:    Component Value Date/Time   COLORURINE YELLOW 04/20/2023 1800   APPEARANCEUR HAZY (A) 04/20/2023 1800   LABSPEC 1.014 04/20/2023 1800   PHURINE 6.0 04/20/2023 1800   GLUCOSEU >=500 (A) 04/20/2023 1800   HGBUR SMALL (A) 04/20/2023 1800   BILIRUBINUR NEGATIVE 04/20/2023 1800   KETONESUR NEGATIVE 04/20/2023 1800   PROTEINUR >=300 (A) 04/20/2023 1800   NITRITE NEGATIVE 04/20/2023 1800   LEUKOCYTESUR NEGATIVE 04/20/2023 1800   Sepsis Labs: @LABRCNTIP (procalcitonin:4,lacticidven:4) )No results found for this or any previous visit (from the past 240 hour(s)).    Scheduled Meds:  amLODipine  10 mg Oral Daily   citalopram  20 mg Oral Daily   hydrALAZINE  50 mg Oral Q8H   insulin aspart  0-15 Units Subcutaneous TID WC   melatonin  6 mg Oral QHS   metoprolol succinate  50 mg Oral Daily   pantoprazole  40 mg Oral Daily   sodium bicarbonate  650 mg Oral BID   Continuous Infusions:  Procedures/Studies: US RENAL  Result Date: 04/19/2023 CLINICAL DATA:  Acute kidney injury. EXAM: RENAL / URINARY TRACT ULTRASOUND COMPLETE COMPARISON:  None Available. FINDINGS: Large body habitus with decreased study resolution. Right Kidney: Renal measurements: 10.1 x 4.7 x 6.3 cm = volume: 157 mL. Echogenicity within normal limits. No mass or hydronephrosis visualized. Left Kidney: Renal measurements: 11.9 x 6.4 x 5.1 cm = volume: 203 mL. Echogenicity within normal limits. No mass or hydronephrosis visualized. Bladder: Appears normal for  degree of bladder distention. Other: None. IMPRESSION: Negative renal ultrasound. Electronically Signed   By: Sebastian Ache M.D.   On: 04/19/2023 16:51   US Abdomen Limited RUQ (LIVER/GB)  Result Date: 04/17/2023 CLINICAL DATA:  [id number] RUQ abdominal pain [id number].  Nausea/vomiting. EXAM: ULTRASOUND ABDOMEN LIMITED RIGHT UPPER QUADRANT COMPARISON:  None Available. FINDINGS: Gallbladder: No gallstones or wall thickening visualized. No sonographic Murphy sign noted by sonographer. Common bile duct: Diameter: Up to 4 mm.  No intrahepatic bile duct dilation. Liver: No focal lesion identified. Within normal limits in parenchymal echogenicity. Portal vein is patent on color Doppler imaging with normal direction of blood flow towards the liver. Other: None. IMPRESSION: 1. Normal right upper quadrant sonogram. Electronically Signed   By: Jules Schick M.D.   On: 04/17/2023 14:07    Catarina Hartshorn, DO  [facility]  If 7PM-7AM, please contact night-coverage [email or web address] Password [email or web address] 04/27/2023, 11:33 AM   LOS: 10 days

## 2023-04-27 NOTE — Hospital Course (Addendum)
  Melissa [name] is a 47 y.o. female with a history of T2DM, HTN, obesity who presented to the ED on 04/17/2023 with nausea, abdominal discomfort found to have AKI, severe HTN. Blood pressure improved with IV treatments and transitioned to oral agents. She remains hospitalized due to persistent creatinine elevation for which nephrology is consulted and further work up is ongoing. Renal biopsy attempted 10/28 but did not tolerate prone positioning and elevated BP. Medications titrated and plan for retry renal biopsy 11/1. Holding eliquis in preparation for this. In order to avoid further delays, nephrology and IR advocated for patient transfer to [facility] to receive remainder of her care.

## 2023-04-27 NOTE — Progress Notes (Signed)
[facility] NEPHROLOGY PROGRESS NOTE  Assessment/ Plan:  # AKI/CKD stage IIIb - likely multifactorial with pre-renal initially given N/V and poor po intake but then complicated by hypertensive urgency.  She could also possibly have TMA due to malignant HTN as well as dysautoregulation after BP controlled.  Renal US without obstruction.  Underlying CKD likely due to poorly controlled DM and HTN.  She has had proteinuria of >300 for several years.  No ACE/ARB or SGLT-2 inhibitor for now given AKI.   -ANA, ANCA, MM panel negative.  -Plan for kidney biopsy, noted it was canceled by IR on 10/28 because of elevated BP.  Hopefully IR will be able to do kidney biopsy today with plan to follow outpatient @ [facility]. -No sign of uremia.  Monitor renal function, strict ins and out.  No need for dialysis.  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  Plan as above.  # Hypertensive urgency: Currently on amlodipine.  Metoprolol dose increased to 50 mg and added hydralazine by primary team on 10/29.  Blood pressure seems to be much better now.    # Anemia - Likely related to CKD.  Iron replete 10/23. Hgb stable, monitor lab.  # Hypokalemia - likely due to GI losses.  Potassium level improved.  # CKD stge IIIb vs progressive CKD stage IV - she will need to follow up with our office in [place] after discharge (she lives in [place]).   Subjective: Seen and examined bedside.  No new event.  Denies nausea, vomiting, chest pain, shortness of breath.  Urine output is not charted.  Objective Vital signs in last 24 hours: Vitals:   04/26/23 1130 04/26/23 1452 04/26/23 2027 04/27/23 0605  BP: (!) 167/84 (!) 147/76 (!) 165/87 (!) 149/79  Pulse: 75 76 78   Resp:  16    Temp:  98.6 F (37 C) 99.2 F (37.3 C)   TempSrc:  Oral Oral   SpO2:  98% 100%   Weight:      Height:       Weight change:   Intake/Output Summary (Last 24 hours) at 04/27/2023 0946 Last data  filed at 04/26/2023 1900 Gross per 24 hour  Intake 480 ml  Output --  Net 480 ml       Labs: RENAL PANEL Recent Labs  Lab 04/23/23 0528 04/24/23 0449 04/25/23 0413 04/26/23 0416 04/27/23 0411  NA 136 136 137 137 134*  K 3.7 3.5 3.7 3.7 4.0  CL 111 110 110 111 107  CO2 17* 18* 16* 18* 19*  GLUCOSE 93 94 92 90 100*  BUN 27* 27* 28* 28* 29*  CREATININE 4.30* 4.12* 4.12* 4.02* 4.20*  CALCIUM 8.3* 8.2* 8.3* 8.4* 8.2*  PHOS 3.7 4.2 3.8 4.3 4.2  ALBUMIN <1.5* <1.5* <1.5* <1.5* <1.5*    Liver Function Tests: Recent Labs  Lab 04/25/23 0413 04/26/23 0416 04/27/23 0411  ALBUMIN <1.5* <1.5* <1.5*   No results for input(s): "LIPASE", "AMYLASE" in the last 168 hours. No results for input(s): "AMMONIA" in the last 168 hours. CBC: Recent Labs    04/18/23 0631 04/19/23 0902 04/20/23 0509 04/20/23 1239 04/21/23 0457 04/25/23 0413  HGB 9.5* 9.1* 8.8*  --  9.0* 9.1*  MCV 77.7* 81.5 79.6*  --  78.2* 78.0*  VITAMINB12 1,021*  --   --   --   --   --   FOLATE 7.5  --   --   --   --   --  FERRITIN 7*  --   --   --   --   --   TIBC 196*  --   --  190*  --   --   IRON 29  --   --  104  --   --   RETICCTPCT 2.3  --   --   --   --   --     Cardiac Enzymes: No results for input(s): "CKTOTAL", "CKMB", "CKMBINDEX", "TROPONINI" in the last 168 hours. CBG: Recent Labs  Lab 04/26/23 0745 04/26/23 1131 04/26/23 1651 04/26/23 2125 04/27/23 0709  GLUCAP 106* 152* 145* 170* 112*    Iron Studies: No results for input(s): "IRON", "TIBC", "TRANSFERRIN", "FERRITIN" in the last 72 hours. Studies/Results: No results found.  Medications: Infusions:   Scheduled Medications:  amLODipine  10 mg Oral Daily   citalopram  20 mg Oral Daily   hydrALAZINE  25 mg Oral Q6H   insulin aspart  0-15 Units Subcutaneous TID WC   melatonin  6 mg Oral QHS   metoprolol succinate  50 mg Oral Daily   pantoprazole  40 mg Oral Daily   sodium bicarbonate  650 mg Oral BID    have reviewed  scheduled and prn medications.  Physical Exam: General:NAD, comfortable Heart:RRR, s1s2 nl Lungs:clear b/l, no crackle Abdomen:soft, Non-tender, non-distended Extremities: Only trace PERI- ankle edema Neurology: Alert, awake and following commands  Azim Gillingham Prasad Kyasia Steuck 04/27/2023,9:46 AM  LOS: 10 days

## 2023-04-28 DIAGNOSIS — I161 Hypertensive emergency: Secondary | ICD-10-CM | POA: Diagnosis not present

## 2023-04-28 LAB — CBC
HCT: 28.1 % — ABNORMAL LOW (ref 36.0–46.0)
Hemoglobin: 8.6 g/dL — ABNORMAL LOW (ref 12.0–15.0)
MCH: 23.4 pg — ABNORMAL LOW (ref 26.0–34.0)
MCHC: 30.6 g/dL (ref 30.0–36.0)
MCV: 76.6 fL — ABNORMAL LOW (ref 80.0–100.0)
Platelets: 295 10*3/uL (ref 150–400)
RBC: 3.67 MIL/uL — ABNORMAL LOW (ref 3.87–5.11)
RDW: 18.9 % — ABNORMAL HIGH (ref 11.5–15.5)
WBC: 5.7 10*3/uL (ref 4.0–10.5)
nRBC: 0 % (ref 0.0–0.2)

## 2023-04-28 LAB — GLUCOSE, CAPILLARY
Glucose-Capillary: 106 mg/dL — ABNORMAL HIGH (ref 70–99)
Glucose-Capillary: 146 mg/dL — ABNORMAL HIGH (ref 70–99)
Glucose-Capillary: 187 mg/dL — ABNORMAL HIGH (ref 70–99)
Glucose-Capillary: 131 mg/dL — ABNORMAL HIGH (ref 70–99)

## 2023-04-28 LAB — RENAL FUNCTION PANEL
Albumin: <1.5 g/dL — ABNORMAL LOW (ref 3.5–5.0)
Anion gap: 5 (ref 5–15)
BUN: 30 mg/dL — ABNORMAL HIGH (ref 6–20)
CO2: 22 mmol/L (ref 22–32)
Calcium: 8.3 mg/dL — ABNORMAL LOW (ref 8.9–10.3)
Chloride: 108 mmol/L (ref 98–111)
Creatinine, Ser: 4.35 mg/dL — ABNORMAL HIGH (ref 0.44–1.00)
GFR, Estimated: 12 mL/min — ABNORMAL LOW (ref >60)
Glucose, Bld: 99 mg/dL (ref 70–99)
Phosphorus: 4.5 mg/dL (ref 2.5–4.6)
Potassium: 4.3 mmol/L (ref 3.5–5.1)
Sodium: 135 mmol/L (ref 135–145)

## 2023-04-28 MED ORDER — METOPROLOL SUCCINATE ER 50 MG PO TB24
100.0000 mg | ORAL_TABLET | Freq: Every day | ORAL | Status: DC
  Administered 2023-04-29: 100 mg via ORAL
  Filled 2023-04-28: qty 2

## 2023-04-28 MED ORDER — HYDRALAZINE HCL 50 MG PO TABS
100.0000 mg | ORAL_TABLET | Freq: Three times a day (TID) | ORAL | Status: DC
  Administered 2023-04-28 – 2023-04-29 (×4): 100 mg via ORAL
  Filled 2023-04-28 (×4): qty 2

## 2023-04-28 MED ORDER — HYDRALAZINE HCL 50 MG PO TABS: 75.0000 mg | ORAL_TABLET | Freq: Three times a day (TID) | ORAL | Status: DC

## 2023-04-28 NOTE — Progress Notes (Signed)
PROGRESS NOTE    Melissa [name]  ZOX:[id number] DOB: 1976/06/03 DOA: 04/17/2023 PCP: Shelby Dubin, FNP   Brief Narrative:  47 y.o. female with a history of T2DM, HTN, obesity presented with nausea, abdominal discomfort and was found to have AKI, severe hypertension.  Blood pressure improved with IV treatments and transitioned to oral agents. She remains hospitalized due to persistent creatinine elevation for which nephrology is consulted and further work up is ongoing. Renal biopsy attempted 10/28 but did not tolerate prone positioning and elevated BP.  Patient was transferred to [facility] on 04/27/2023 as per nephrology and IR recommendations.  Assessment & Plan:   AKI on CKD stage IIIb/ATN Acute metabolic acidosis -Baseline creatinine of around 1.4. -Creatinine pending today.  Creatinine 4.20 on 04/27/2023.  Nephrology following. -Renal biopsy attempted 10/28 but did not tolerate prone positioning and elevated BP.  Patient was transferred to [facility] on 04/27/2023 as per nephrology and IR recommendations. -IR planning for possible renal biopsy again today. -Monitor creatinine and follow further recommendations from nephrology -Continue oral sodium bicarbonate.  Monitor bicarb.  Hypertensive emergency -Resolved.  Blood pressure still on the high side but improving.  Continue amlodipine, hydralazine, metoprolol succinate  Diabetes mellitus type 2 with hyperglycemia -A1c 8.5.  Oral agents on hold.  Continue SSI.  Nausea, vomiting -LFTs and right upper quadrant ultrasound normal -Improved.  Currently tolerating diet  Iron deficiency anemia -Hemoglobin currently stable.  Monitor intermittently.  Paroxysmal A-fib -Currently rate controlled.  Continue metoprolol.  Eliquis on hold  Morbid obesity -Outpatient follow-up   DVT prophylaxis: Eliquis on hold Code Status: Full Family Communication: None at bedside Disposition Plan: Status is: Inpatient Remains  inpatient appropriate because: Of severity of illness  Consultants: IR/nephrology  Procedures: As above  Antimicrobials: None   Subjective: Patient seen and examined at bedside.  No fever, chest pain, worsening shortness of breath reported.  Complains of intermittent headache.  Objective: Vitals:   04/27/23 2034 04/28/23 0135 04/28/23 0650 04/28/23 0652  BP: (!) 163/98 (!) 146/79 (!) 173/87 (!) 173/87  Pulse: 87 81 79   Resp: 20 18 18    Temp: 98.4 F (36.9 C) 98.6 F (37 C) 98.7 F (37.1 C)   TempSrc: Oral Oral Oral   SpO2: 99% 98% 98%   Weight:      Height:        Intake/Output Summary (Last 24 hours) at 04/28/2023 0730 Last data filed at 04/27/2023 1500 Gross per 24 hour  Intake 340 ml  Output --  Net 340 ml   Filed Weights   04/17/23 1107 04/20/23 0800  Weight: 136.1 kg (!) 141.5 kg    Examination:  General exam: Appears calm and comfortable.  On room air. Respiratory system: Bilateral decreased breath sounds at bases with scattered crackles Cardiovascular system: S1 & S2 heard, Rate controlled Gastrointestinal system: Abdomen is morbidly obese, nondistended, soft and nontender. Normal bowel sounds heard. Extremities: No cyanosis, clubbing; lower extremity edema present Central nervous system: Alert and oriented. No focal neurological deficits. Moving extremities Skin: No rashes, lesions or ulcers Psychiatry: Judgement and insight appear normal. Mood & affect appropriate.     Data Reviewed: I have personally reviewed following labs and imaging studies  CBC: Recent Labs  Lab 04/25/23 0413 04/28/23 0658  WBC 5.3 5.7  HGB 9.1* 8.6*  HCT 30.9* 28.1*  MCV 78.0* 76.6*  PLT 290 295   Basic Metabolic Panel: Recent Labs  Lab 04/23/23 0528 04/24/23 0449 04/25/23 0413 04/26/23  0416 04/27/23 0411  NA 136 136 137 137 134*  K 3.7 3.5 3.7 3.7 4.0  CL 111 110 110 111 107  CO2 17* 18* 16* 18* 19*  GLUCOSE 93 94 92 90 100*  BUN 27* 27* 28* 28* 29*   CREATININE 4.30* 4.12* 4.12* 4.02* 4.20*  CALCIUM 8.3* 8.2* 8.3* 8.4* 8.2*  PHOS 3.7 4.2 3.8 4.3 4.2   GFR: Estimated Creatinine Clearance: 23.7 mL/min (A) (by C-G formula based on SCr of 4.2 mg/dL (H)). Liver Function Tests: Recent Labs  Lab 04/23/23 0528 04/24/23 0449 04/25/23 0413 04/26/23 0416 04/27/23 0411  ALBUMIN <1.5* <1.5* <1.5* <1.5* <1.5*   No results for input(s): "LIPASE", "AMYLASE" in the last 168 hours. No results for input(s): "AMMONIA" in the last 168 hours. Coagulation Profile: Recent Labs  Lab 04/22/23 0404 04/25/23 0413  INR 1.0 1.0   Cardiac Enzymes: No results for input(s): "CKTOTAL", "CKMB", "CKMBINDEX", "TROPONINI" in the last 168 hours. BNP (last 3 results) No results for input(s): "PROBNP" in the last 8760 hours. HbA1C: No results for input(s): "HGBA1C" in the last 72 hours. CBG: Recent Labs  Lab 04/27/23 0709 04/27/23 1121 04/27/23 1400 04/27/23 1724 04/27/23 2035  GLUCAP 112* 208* 181* 91 180*   Lipid Profile: No results for input(s): "CHOL", "HDL", "LDLCALC", "TRIG", "CHOLHDL", "LDLDIRECT" in the last 72 hours. Thyroid Function Tests: No results for input(s): "TSH", "T4TOTAL", "FREET4", "T3FREE", "THYROIDAB" in the last 72 hours. Anemia Panel: No results for input(s): "VITAMINB12", "FOLATE", "FERRITIN", "TIBC", "IRON", "RETICCTPCT" in the last 72 hours. Sepsis Labs: No results for input(s): "PROCALCITON", "LATICACIDVEN" in the last 168 hours.  No results found for this or any previous visit (from the past 240 hour(s)).       Radiology Studies: No results found.      Scheduled Meds:  amLODipine  10 mg Oral Daily   citalopram  20 mg Oral Daily   hydrALAZINE  50 mg Oral Q8H   insulin aspart  0-15 Units Subcutaneous TID WC   melatonin  6 mg Oral QHS   metoprolol succinate  50 mg Oral Daily   pantoprazole  40 mg Oral Daily   sodium bicarbonate  650 mg Oral BID   Continuous Infusions:        Glade Lloyd,  MD [facility] 04/28/2023, 7:30 AM

## 2023-04-28 NOTE — Progress Notes (Signed)
Patient ID: Melissa [name], female   DOB: 02-17-1976, 47 y.o.   MRN: [id number]    Discussed BP readings with Dr Fredia Sorrow this am Really need pt optimized and close to wnl BP to proceed with Renal biopsy safely  When attempted previously---- she became very hypertensive when flipped to stomach.  We will Hold Bx again today-- I will chat with Nephrology and [facility] to be sure aware of IR needs

## 2023-04-28 NOTE — Progress Notes (Signed)
Admit: 04/17/2023 LOS: 11  9F AoCKD3b, nephrotic range proteinuria, longstanding DM2, admitted with hypertensive emergency.  Subjective:  Remained at [facility] after transfer from [facility] for kidney biopsy because of persistent hypertension Blood pressures remain elevated this morning, still unable to receive kidney biopsy. Currently on amlodipine, hydralazine, metoprolol, has a history of atrial fibrillation Complains of a left-sided headache Creatinine 4.35, BUN 30, albumin less than 1.5.  K is 4.3.  10/30 0701 - 10/31 0700 In: 340 [P.O.:340] Out: -   Filed Weights   04/17/23 1107 04/20/23 0800  Weight: 136.1 kg (!) 141.5 kg    Scheduled Meds:  amLODipine  10 mg Oral Daily   citalopram  20 mg Oral Daily   hydrALAZINE  100 mg Oral Q8H   insulin aspart  0-15 Units Subcutaneous TID WC   melatonin  6 mg Oral QHS   metoprolol succinate  50 mg Oral Daily   pantoprazole  40 mg Oral Daily   sodium bicarbonate  650 mg Oral BID   Continuous Infusions: PRN Meds:.acetaminophen **OR** acetaminophen, labetalol, metoCLOPramide, ondansetron **OR** ondansetron (ZOFRAN) IV, mouth rinse, polyethylene glycol  Current Labs: reviewed  Previously negative ANA, double-stranded DNA, ANCA, complement 3, complement 4. UOP a C12.7   Physical Exam:  Blood pressure (!) 180/93, pulse 83, temperature 98.7 F (37.1 C), temperature source Oral, resp. rate 16, height 5\' 5"  (1.651 m), weight (!) 141.5 kg, last menstrual period 04/03/2023, SpO2 93%, unknown if currently breastfeeding. 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  For kidney biopsy but unable to obtain because of #2.  Fairly stable.  Not uremic.  No indication for  dialysis. Hypertension, elevated leading to inability to complete kidney biopsy.  Continue continue amlodipine, increase hydralazine and metoprolol. DM2 Anemia, iron replete.  Stable and will trend.  P Will attempt kidney biopsy 1 more time tomorrow, but if unable to complete we will discharge and obtain as an outpatient. Increase metoprolol, hydralazine Continue to follow low-sodium Medication Issues; Preferred narcotic agents for pain control are hydromorphone, fentanyl, and methadone. Morphine should not be used.  Baclofen should be avoided Avoid oral sodium phosphate and magnesium citrate based laxatives / bowel preps    Sabra Heck MD 04/28/2023, 10:44 AM  Recent Labs  Lab 04/26/23 0416 04/27/23 0411 04/28/23 0658  NA 137 134* 135  K 3.7 4.0 4.3  CL 111 107 108  CO2 18* 19* 22  GLUCOSE 90 100* 99  BUN 28* 29* 30*  CREATININE 4.02* 4.20* 4.35*  CALCIUM 8.4* 8.2* 8.3*  PHOS 4.3 4.2 4.5   Recent Labs  Lab 04/25/23 0413 04/28/23 0658  WBC 5.3 5.7  HGB 9.1* 8.6*  HCT 30.9* 28.1*  MCV 78.0* 76.6*  PLT 290 295

## 2023-04-28 NOTE — Consult Note (Signed)
Chief Complaint: Patient was seen in consultation today for random renal biopsy Chief Complaint  Patient presents with   Abdominal Pain   at the request of Dr Eddie North   Supervising Physician: Irish Lack  Patient Status: [facility] - In-pt  History of Present Illness: Melissa [name] is a 47 y.o. female   FULL Code status per pt  DM; HTN; obese To ED 10/20 with abd pain; nausea +acute kidney injury--- severe HTN Cr remaining high AKI/CKD stage IIIb - likely multifactorial with pre-renal  Nephrotic range proteinuria  Nephrology requesting renal biopsy Attempted in IR 10/28--- developed very high BP and procedure was cancelled--- sent back to [facility] MD has treated BP--- doing better now 146/79 this am (163/90s last night)  Will watch BP this am--- if remains doing well-- plan for Bx today  Past Medical History:  Diagnosis Date   Anemia    Anxiety    Complication of anesthesia    Diabetes mellitus without complication (HCC)    Dysrhythmia    GERD (gastroesophageal reflux disease)    History of hiatal hernia    Hypertension    PONV (postoperative nausea and vomiting)     Past Surgical History:  Procedure Laterality Date   BIOPSY  02/02/2022   Procedure: BIOPSY;  Surgeon: Dolores Frame, MD;  Location: [facility] ENDO SUITE;  Service: Gastroenterology;;   ESOPHAGOGASTRODUODENOSCOPY (EGD) WITH PROPOFOL N/A 02/02/2022   Procedure: ESOPHAGOGASTRODUODENOSCOPY (EGD) WITH PROPOFOL;  Surgeon: Dolores Frame, MD;  Location: [facility];  Service: Gastroenterology;  Laterality: N/A;  240   NO PAST SURGERIES      Allergies: Patient has no known allergies.  Medications: Prior to Admission medications   Medication Sig Start Date End Date Taking? Authorizing Provider  amLODipine (NORVASC) 10 MG tablet Take 10 mg by mouth daily. 12/16/21  Yes [provider]  cetirizine (ZYRTEC) 10 MG tablet Take 10 mg by mouth daily. 09/16/20  Yes [provider]   citalopram (CELEXA) 20 MG tablet Take 20 mg by mouth daily. 05/07/19  Yes [provider]  ELIQUIS 5 MG TABS tablet Take 5 mg by mouth 2 (two) times daily. 08/24/21  Yes [provider]  glimepiride (AMARYL) 4 MG tablet Take 4 mg by mouth every morning. 08/24/21  Yes [provider]  JENCYCLA 0.35 MG tablet Take 1 tablet by mouth daily. 11/25/21  Yes [provider]  metoprolol succinate (TOPROL-XL) 25 MG 24 hr tablet Take 25 mg by mouth daily. 11/23/21  Yes [provider]  pantoprazole (PROTONIX) 40 MG tablet Take 40 mg by mouth daily.   Yes [provider]  pioglitazone (ACTOS) 15 MG tablet Take 15 mg by mouth daily.   Yes [provider]  ferrous sulfate 325 (65 FE) MG tablet Take 325 mg by mouth daily. Patient not taking: Reported on 04/19/2023    [provider]     History reviewed. No pertinent family history.  Social History   Socioeconomic History   Marital status: Married    Spouse name: Not on file   Number of children: Not on file   Years of education: Not on file   Highest education level: Not on file  Occupational History   Not on file  Tobacco Use   Smoking status: Former    Types: Cigarettes    Passive exposure: Past   Smokeless tobacco: Former  Substance and Sexual Activity   Alcohol use: Yes    Comment: occasional drink  Drug use: No   Sexual activity: Never  Other Topics Concern   Not on file  Social History Narrative   Not on file   Social Determinants of Health   Financial Resource Strain: Low Risk  (11/10/2021)   Received from [facility]   Overall Financial Resource Strain (CARDIA)    Difficulty of Paying Living Expenses: Not very hard  Food Insecurity: No Food Insecurity (04/18/2023)   Hunger Vital Sign    Worried About Running Out of Food in the Last Year: Never true    Ran Out of Food in the Last Year: Never true  Transportation Needs: No Transportation  Needs (04/18/2023)   PRAPARE - Administrator, Civil Service (Medical): No    Lack of Transportation (Non-Medical): No  Physical Activity: Not on file  Stress: Not on file  Social Connections: Not on file    Review of Systems: A 12 point ROS discussed and pertinent positives are indicated in the HPI above.  All other systems are negative.  Review of Systems  Constitutional:  Positive for activity change and fatigue. Negative for fever.  Respiratory:  Negative for cough and shortness of breath.   Cardiovascular:  Negative for chest pain.  Gastrointestinal:  Positive for abdominal pain and nausea.  Psychiatric/Behavioral:  Negative for behavioral problems and confusion.     Vital Signs: BP (!) 146/79 (BP Location: Left Arm)   Pulse 81   Temp 98.6 F (37 C) (Oral)   Resp 18   Ht 5\' 5"  (1.651 m)   Wt (!) 311 lb 15.2 oz (141.5 kg)   LMP 04/03/2023   SpO2 98%   BMI 51.91 kg/m     Physical Exam Vitals reviewed.  HENT:     Mouth/Throat:     Mouth: Mucous membranes are moist.  Cardiovascular:     Rate and Rhythm: Normal rate and regular rhythm.  Pulmonary:     Effort: Pulmonary effort is normal.     Breath sounds: Normal breath sounds. No wheezing.  Abdominal:     Palpations: Abdomen is soft.     Tenderness: There is no abdominal tenderness.  Skin:    General: Skin is warm and dry.  Neurological:     Mental Status: She is alert and oriented to person, place, and time.  Psychiatric:        Behavior: Behavior normal.     Imaging: US RENAL  Result Date: 04/19/2023 CLINICAL DATA:  Acute kidney injury. EXAM: RENAL / URINARY TRACT ULTRASOUND COMPLETE COMPARISON:  None Available. FINDINGS: Large body habitus with decreased study resolution. Right Kidney: Renal measurements: 10.1 x 4.7 x 6.3 cm = volume: 157 mL. Echogenicity within normal limits. No mass or hydronephrosis visualized. Left Kidney: Renal measurements: 11.9 x 6.4 x 5.1 cm = volume: 203 mL.  Echogenicity within normal limits. No mass or hydronephrosis visualized. Bladder: Appears normal for degree of bladder distention. Other: None. IMPRESSION: Negative renal ultrasound. Electronically Signed   By: Sebastian Ache M.D.   On: 04/19/2023 16:51   US Abdomen Limited RUQ (LIVER/GB)  Result Date: 04/17/2023 CLINICAL DATA:  [id number] RUQ abdominal pain [id number].  Nausea/vomiting. EXAM: ULTRASOUND ABDOMEN LIMITED RIGHT UPPER QUADRANT COMPARISON:  None Available. FINDINGS: Gallbladder: No gallstones or wall thickening visualized. No sonographic Murphy sign noted by sonographer. Common bile duct: Diameter: Up to 4 mm.  No intrahepatic bile duct dilation. Liver: No focal lesion identified. Within normal limits in parenchymal echogenicity. Portal vein is patent  on color Doppler imaging with normal direction of blood flow towards the liver. Other: None. IMPRESSION: 1. Normal right upper quadrant sonogram. Electronically Signed   By: Jules Schick M.D.   On: 04/17/2023 14:07    Labs:  CBC: Recent Labs    04/19/23 0902 04/20/23 0509 04/21/23 0457 04/25/23 0413  WBC 7.2 6.4 6.4 5.3  HGB 9.1* 8.8* 9.0* 9.1*  HCT 31.8* 30.4* 29.7* 30.9*  PLT 335 291 283 290    COAGS: Recent Labs    04/22/23 0404 04/25/23 0413  INR 1.0 1.0    BMP: Recent Labs    04/24/23 0449 04/25/23 0413 04/26/23 0416 04/27/23 0411  NA 136 137 137 134*  K 3.5 3.7 3.7 4.0  CL 110 110 111 107  CO2 18* 16* 18* 19*  GLUCOSE 94 92 90 100*  BUN 27* 28* 28* 29*  CALCIUM 8.2* 8.3* 8.4* 8.2*  CREATININE 4.12* 4.12* 4.02* 4.20*  GFRNONAA 13* 13* 13* 12*    LIVER FUNCTION TESTS: Recent Labs    04/17/23 1321 04/19/23 0902 04/21/23 0457 04/24/23 0449 04/25/23 0413 04/26/23 0416 04/27/23 0411  BILITOT 0.5 0.4  --   --   --   --   --   AST 24 17  --   --   --   --   --   ALT 22 18  --   --   --   --   --   ALKPHOS 58 46  --   --   --   --   --   PROT 6.2* 4.8*  --   --   --   --   --   ALBUMIN 1.8* <1.5*   < >  <1.5* <1.5* <1.5* <1.5*   < > = values in this interval not displayed.    TUMOR MARKERS: No results for input(s): "AFPTM", "CEA", "CA199", "CHROMGRNA" in the last 8760 hours.  Assessment and Plan:  Scheduled for possible random renal biopsy Possibly today---- checking BP Risks and benefits of random renal biopsy was discussed with the patient and/or patient's family including, but not limited to bleeding, infection, damage to adjacent structures or low yield requiring additional tests.  All of the questions were answered and there is agreement to proceed. Consent signed and in chart.   Thank you for this interesting consult.  I greatly enjoyed meeting RISHITA ADAMEK and look forward to participating in their care.  A copy of this report was sent to the requesting provider on this date.  Electronically Signed: Robet Leu, PA-C 04/28/2023, 6:36 AM   I spent a total of 20 Minutes    in face to face in clinical consultation, greater than 50% of which was counseling/coordinating care for random renal

## 2023-04-29 ENCOUNTER — Inpatient Hospital Stay (HOSPITAL_COMMUNITY): Payer: No Typology Code available for payment source

## 2023-04-29 DIAGNOSIS — I161 Hypertensive emergency: Secondary | ICD-10-CM | POA: Diagnosis not present

## 2023-04-29 LAB — RENAL FUNCTION PANEL
Albumin: <1.5 g/dL — ABNORMAL LOW (ref 3.5–5.0)
Anion gap: 7 (ref 5–15)
BUN: 28 mg/dL — ABNORMAL HIGH (ref 6–20)
CO2: 20 mmol/L — ABNORMAL LOW (ref 22–32)
Calcium: 8.4 mg/dL — ABNORMAL LOW (ref 8.9–10.3)
Chloride: 112 mmol/L — ABNORMAL HIGH (ref 98–111)
Creatinine, Ser: 4.49 mg/dL — ABNORMAL HIGH (ref 0.44–1.00)
GFR, Estimated: 12 mL/min — ABNORMAL LOW (ref >60)
Glucose, Bld: 106 mg/dL — ABNORMAL HIGH (ref 70–99)
Phosphorus: 4.9 mg/dL — ABNORMAL HIGH (ref 2.5–4.6)
Potassium: 4.2 mmol/L (ref 3.5–5.1)
Sodium: 139 mmol/L (ref 135–145)

## 2023-04-29 LAB — GLUCOSE, CAPILLARY
Glucose-Capillary: 102 mg/dL — ABNORMAL HIGH (ref 70–99)
Glucose-Capillary: 120 mg/dL — ABNORMAL HIGH (ref 70–99)
Glucose-Capillary: 213 mg/dL — ABNORMAL HIGH (ref 70–99)

## 2023-04-29 MED ORDER — FENTANYL CITRATE (PF) 100 MCG/2ML IJ SOLN
INTRAMUSCULAR | Status: AC | PRN
  Administered 2023-04-29: 50 ug via INTRAVENOUS

## 2023-04-29 MED ORDER — LIDOCAINE HCL (PF) 1 % IJ SOLN
10.0000 mL | Freq: Once | INTRAMUSCULAR | Status: AC
  Administered 2023-04-29: 10 mL via INTRADERMAL

## 2023-04-29 MED ORDER — HYDRALAZINE HCL 100 MG PO TABS: 100.0000 mg | ORAL_TABLET | Freq: Three times a day (TID) | ORAL | 0 refills | Status: AC

## 2023-04-29 MED ORDER — MIDAZOLAM HCL 2 MG/2ML IJ SOLN
INTRAMUSCULAR | Status: AC | PRN
Start: 2023-04-29 — End: 2023-04-29
  Administered 2023-04-29: 1 mg via INTRAVENOUS

## 2023-04-29 MED ORDER — HYDRALAZINE HCL 20 MG/ML IJ SOLN
INTRAMUSCULAR | Status: AC
  Filled 2023-04-29: qty 1

## 2023-04-29 MED ORDER — ASPIRIN-ACETAMINOPHEN-CAFFEINE 250-250-65 MG PO TABS
1.0000 | ORAL_TABLET | Freq: Four times a day (QID) | ORAL | Status: DC | PRN
  Administered 2023-04-29: 1 via ORAL
  Filled 2023-04-29 (×2): qty 1

## 2023-04-29 MED ORDER — METOPROLOL SUCCINATE ER 100 MG PO TB24: 100.0000 mg | ORAL_TABLET | Freq: Every day | ORAL | 0 refills | Status: AC

## 2023-04-29 MED ORDER — ELIQUIS 5 MG PO TABS: 5.0000 mg | ORAL_TABLET | Freq: Two times a day (BID) | ORAL | Status: DC

## 2023-04-29 MED ORDER — SODIUM BICARBONATE 650 MG PO TABS: 650.0000 mg | ORAL_TABLET | Freq: Two times a day (BID) | ORAL | 0 refills | Status: AC

## 2023-04-29 MED ORDER — HYDRALAZINE HCL 20 MG/ML IJ SOLN
10.0000 mg | Freq: Once | INTRAMUSCULAR | Status: DC
  Filled 2023-04-29: qty 1

## 2023-04-29 MED ORDER — HYDRALAZINE HCL 20 MG/ML IJ SOLN
INTRAMUSCULAR | Status: AC | PRN
  Administered 2023-04-29 (×2): 10 mg via INTRAVENOUS

## 2023-04-29 MED ORDER — FENTANYL CITRATE (PF) 100 MCG/2ML IJ SOLN
INTRAMUSCULAR | Status: AC
  Filled 2023-04-29: qty 2

## 2023-04-29 MED ORDER — MIDAZOLAM HCL 2 MG/2ML IJ SOLN
INTRAMUSCULAR | Status: AC
  Filled 2023-04-29: qty 2

## 2023-04-29 NOTE — Discharge Summary (Signed)
Physician Discharge Summary  NILE DORNING AVW:[id number] DOB: [birth year] DOA: 04/17/2023  PCP: Shelby Dubin, FNP  Admit date: 04/17/2023 Discharge date: 04/29/2023  Admitted From: Home Disposition: Home  Recommendations for Outpatient Follow-up:  Follow up with PCP in 1 week with repeat CBC/BMP Outpatient follow-up with nephrology Follow up in ED if symptoms worsen or new appear   Home Health: No Equipment/Devices: None  Discharge Condition: Stable CODE STATUS: Full Diet recommendation: Heart healthy/carb modified  Brief/Interim Summary: 47 y.o. female with a history of T2DM, HTN, obesity presented with nausea, abdominal discomfort and was found to have AKI, severe hypertension.  Blood pressure improved with IV treatments and transitioned to oral agents. She remains hospitalized due to persistent creatinine elevation for which nephrology is consulted and further work up is ongoing. Renal biopsy attempted 10/28 but did not tolerate prone positioning and elevated BP.  Patient was transferred to [facility] on 04/27/2023 as per nephrology and IR recommendations.  Her kidney function is still elevated but has remained stable.  She is making urine.  Blood pressure is improving after changes in her antihypertensive regimen.  Nephrology has cleared the patient for discharge home today after patient undergoes IR guided renal biopsy.  Discharge patient home today.  Outpatient follow-up with PCP and nephrology.  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 -Creatinine 4.49 today. Nephrology following. -Renal biopsy attempted 10/28 but did not tolerate prone positioning and elevated BP.  Patient was transferred to [facility] on 04/27/2023 as per nephrology and IR recommendations. -Continue oral sodium bicarbonate.  Still has mild acidosis. -Nephrology has cleared the patient for discharge home today after  patient undergoes IR guided renal biopsy.  Discharge patient home today.  Outpatient follow-up with PCP and nephrology.  Hypertensive emergency -Resolved.  Blood pressure still on the high side but improving.  Continue amlodipine along with higher doses of metoprolol and hydralazine.  Outpatient follow-up with PCP and nephrology.    Diabetes mellitus type 2 with hyperglycemia -A1c 8.5.  Blood sugar is on the lower side.  Continue carb modified diet.  Will keep glimepiride on hold till reevaluation with PCP.  Resume pioglitazone.    Nausea, vomiting -LFTs and right upper quadrant ultrasound normal -Improved.  Currently tolerating diet   Iron deficiency anemia -Hemoglobin currently stable.  Monitor intermittently as an outpatient.   Paroxysmal A-fib -Currently rate controlled.  Continue metoprolol.  Eliquis will be resumed  Morbid obesity -Outpatient follow-up   Discharge Instructions   Allergies as of 04/29/2023   No Known Allergies      Medication List     STOP taking these medications    glimepiride 4 MG tablet Commonly known as: AMARYL       TAKE these medications    amLODipine 10 MG tablet Commonly known as: NORVASC Take 10 mg by mouth daily.   cetirizine 10 MG tablet Commonly known as: ZYRTEC Take 10 mg by mouth daily.   citalopram 20 MG tablet Commonly known as: CELEXA Take 20 mg by mouth daily.   Eliquis 5 MG Tabs tablet Generic drug: apixaban Take 5 mg by mouth 2 (two) times daily.   ferrous sulfate 325 (65 FE) MG tablet Take 325 mg by mouth daily.   hydrALAZINE 100 MG tablet Commonly known as: APRESOLINE Take 1 tablet (100 mg total) by mouth every 8 (eight) hours.   Jencycla 0.35 MG tablet Generic drug: norethindrone Take 1 tablet by  mouth daily.   metoprolol succinate 100 MG 24 hr tablet Commonly known as: TOPROL-XL Take 1 tablet (100 mg total) by mouth daily. Take with or immediately following a meal. Start taking on: April 30, 2023 What changed:  medication strength how much to take additional instructions   pioglitazone 15 MG tablet Commonly known as: ACTOS Take 15 mg by mouth daily.   Protonix 40 MG tablet Generic drug: pantoprazole Take 40 mg by mouth daily.   sodium bicarbonate 650 MG tablet Take 1 tablet (650 mg total) by mouth 2 (two) times daily.        Follow-up Information     Bucio, Julian Reil, FNP. Schedule an appointment as soon as possible for a visit in 1 week(s).   Specialty: Family Medicine Why: with repeat bmp Contact information: [address] [place] [address] [phone]         [place]. Schedule an appointment as soon as possible for a visit in 1 week(s).   Contact information: [address] [place] [address] [phone]                No Known Allergies  Consultations: Nephrology/IR   Procedures/Studies: US RENAL  Result Date: 04/19/2023 CLINICAL DATA:  Acute kidney injury. EXAM: RENAL / URINARY TRACT ULTRASOUND COMPLETE COMPARISON:  None Available. FINDINGS: Large body habitus with decreased study resolution. Right Kidney: Renal measurements: 10.1 x 4.7 x 6.3 cm = volume: 157 mL. Echogenicity within normal limits. No mass or hydronephrosis visualized. Left Kidney: Renal measurements: 11.9 x 6.4 x 5.1 cm = volume: 203 mL. Echogenicity within normal limits. No mass or hydronephrosis visualized. Bladder: Appears normal for degree of bladder distention. Other: None. IMPRESSION: Negative renal ultrasound. Electronically Signed   By: Sebastian Ache M.D.   On: 04/19/2023 16:51   US Abdomen Limited RUQ (LIVER/GB)  Result Date: 04/17/2023 CLINICAL DATA:  [id number] RUQ abdominal pain 151470.  Nausea/vomiting. EXAM: ULTRASOUND ABDOMEN LIMITED RIGHT UPPER QUADRANT COMPARISON:  None Available. FINDINGS: Gallbladder: No gallstones or wall thickening visualized. No sonographic Murphy sign noted by sonographer. Common bile duct: Diameter: Up to 4 mm.   No intrahepatic bile duct dilation. Liver: No focal lesion identified. Within normal limits in parenchymal echogenicity. Portal vein is patent on color Doppler imaging with normal direction of blood flow towards the liver. Other: None. IMPRESSION: 1. Normal right upper quadrant sonogram. Electronically Signed   By: Jules Schick M.D.   On: 04/17/2023 14:07      Subjective: Patient seen and examined at bedside.  Feels okay to go home today. Complains of some headache.  No fever, vomiting, chest pain or shortness of breath reported.  Discharge Exam: Vitals:   04/29/23 1016 04/29/23 1020  BP: 130/79 (!) 141/75  Pulse: 86 87  Resp: 10 10  Temp:    SpO2: 100% 100%    General: Pt is alert, awake, not in acute distress.  On room air. Cardiovascular: rate controlled, S1/S2 + Respiratory: bilateral decreased breath sounds at bases Abdominal: Soft, morbidly obese, NT, ND, bowel sounds + Extremities: Bilateral lower extremity edema present; no cyanosis    The results of significant diagnostics from this hospitalization (including imaging, microbiology, ancillary and laboratory) are listed below for reference.     Microbiology: No results found for this or any previous visit (from the past 240 hour(s)).   Labs: BNP (last 3 results) No results for input(s): "BNP" in the last 8760 hours. Basic Metabolic Panel: Recent Labs  Lab 04/25/23 0413 04/26/23 0416 04/27/23 0411 04/28/23 0658 04/29/23 0816  NA 137 137 134* 135 139  K 3.7 3.7 4.0 4.3 4.2  CL 110 111 107 108 112*  CO2 16* 18* 19* 22 20*  GLUCOSE 92 90 100* 99 106*  BUN 28* 28* 29* 30* 28*  CREATININE 4.12* 4.02* 4.20* 4.35* 4.49*  CALCIUM 8.3* 8.4* 8.2* 8.3* 8.4*  PHOS 3.8 4.3 4.2 4.5 4.9*   Liver Function Tests: Recent Labs  Lab 04/25/23 0413 04/26/23 0416 04/27/23 0411 04/28/23 0658 04/29/23 0816  ALBUMIN <1.5* <1.5* <1.5* <1.5* <1.5*   No results for input(s): "LIPASE", "AMYLASE" in the last 168 hours. No  results for input(s): "AMMONIA" in the last 168 hours. CBC: Recent Labs  Lab 04/25/23 0413 04/28/23 0658  WBC 5.3 5.7  HGB 9.1* 8.6*  HCT 30.9* 28.1*  MCV 78.0* 76.6*  PLT 290 295   Cardiac Enzymes: No results for input(s): "CKTOTAL", "CKMB", "CKMBINDEX", "TROPONINI" in the last 168 hours. BNP: Invalid input(s): "POCBNP" CBG: Recent Labs  Lab 04/28/23 0737 04/28/23 1156 04/28/23 1528 04/28/23 2016 04/29/23 0625  GLUCAP 106* 146* 187* 131* 102*   D-Dimer No results for input(s): "DDIMER" in the last 72 hours. Hgb A1c No results for input(s): "HGBA1C" in the last 72 hours. Lipid Profile No results for input(s): "CHOL", "HDL", "LDLCALC", "TRIG", "CHOLHDL", "LDLDIRECT" in the last 72 hours. Thyroid function studies No results for input(s): "TSH", "T4TOTAL", "T3FREE", "THYROIDAB" in the last 72 hours.  Invalid input(s): "FREET3" Anemia work up No results for input(s): "VITAMINB12", "FOLATE", "FERRITIN", "TIBC", "IRON", "RETICCTPCT" in the last 72 hours. Urinalysis    Component Value Date/Time   COLORURINE YELLOW 04/20/2023 1800   APPEARANCEUR HAZY (A) 04/20/2023 1800   LABSPEC 1.014 04/20/2023 1800   PHURINE 6.0 04/20/2023 1800   GLUCOSEU >=500 (A) 04/20/2023 1800   HGBUR SMALL (A) 04/20/2023 1800   BILIRUBINUR NEGATIVE 04/20/2023 1800   KETONESUR NEGATIVE 04/20/2023 1800   PROTEINUR >=300 (A) 04/20/2023 1800   NITRITE NEGATIVE 04/20/2023 1800   LEUKOCYTESUR NEGATIVE 04/20/2023 1800   Sepsis Labs Recent Labs  Lab 04/25/23 0413 04/28/23 0658  WBC 5.3 5.7   Microbiology No results found for this or any previous visit (from the past 240 hour(s)).   Time coordinating discharge: 35 minutes  SIGNED:   Glade Lloyd, MD  [facility] 04/29/2023, 10:27 AM

## 2023-04-29 NOTE — Progress Notes (Signed)
Pt to be discharge home to self care.  Reviewed AVS, informed pt of changes to her medications and that she had pending prescription to be picked up from her preferred pharmacy.  Made pt aware that she had follow up appointments with her PCP.  Answered any pending questions.  Pt had no further questions.  Assisted pt in gathering her belongings.  Removed her PIV which was CDI and free from s/sx of infection upon removal.  Pt was escorted off the unit via wheelchair accompanied by Maxie Better, RN where is ride awaits to take her home.  Pt was discharge in stable condition

## 2023-04-29 NOTE — Progress Notes (Signed)
Admit: 04/17/2023 LOS: 12  33F AoCKD3b, nephrotic range proteinuria, longstanding DM2, admitted with hypertensive emergency.  Subjective:  Completed kidney biopsy this morning Continues to have headache BPs are stable SCr 4.5, about the same, BUN and K ok  No intake/output data recorded.  Filed Weights   04/17/23 1107 04/20/23 0800  Weight: 136.1 kg (!) 141.5 kg    Scheduled Meds:  amLODipine  10 mg Oral Daily   citalopram  20 mg Oral Daily   hydrALAZINE  10 mg Intravenous Once   hydrALAZINE  100 mg Oral Q8H   insulin aspart  0-15 Units Subcutaneous TID WC   melatonin  6 mg Oral QHS   metoprolol succinate  100 mg Oral Daily   pantoprazole  40 mg Oral Daily   sodium bicarbonate  650 mg Oral BID   Continuous Infusions: PRN Meds:.acetaminophen **OR** acetaminophen, aspirin-acetaminophen-caffeine, labetalol, metoCLOPramide, ondansetron **OR** ondansetron (ZOFRAN) IV, mouth rinse, polyethylene glycol  Current Labs: reviewed  Previously negative ANA, double-stranded DNA, ANCA, complement 3, complement 4. UOP a C12.7   Physical Exam:  Blood pressure 139/75, pulse 88, temperature 99 F (37.2 C), temperature source Oral, resp. rate 16, height 5\' 5"  (1.651 m), weight (!) 141.5 kg, last menstrual period 04/03/2023, SpO2 94%, unknown if currently breastfeeding. 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  Not uremic.  No indication for dialysis. Kidnye biopsy 11/1.  Hypertension, elevated leading to inability to complete kidney biopsy.  Continue continue amlodipine, increase hydralazine and metoprolol. Stable. DM2 Anemia, iron replete.  Stable and will trend.  P Biopsy completed today If stable VS,  pain, and no gross hematuria ok for DC today per IR protocol I will set up close outpt f/u to discuss next steps Continue to follow low-sodium diet Medication Issues; Preferred narcotic agents for pain control are hydromorphone, fentanyl, and methadone. Morphine should not be used.  Baclofen should be avoided Avoid oral sodium phosphate and magnesium citrate based laxatives / bowel preps    Sabra Heck MD 04/29/2023, 11:48 AM  Recent Labs  Lab 04/27/23 0411 04/28/23 0658 04/29/23 0816  NA 134* 135 139  K 4.0 4.3 4.2  CL 107 108 112*  CO2 19* 22 20*  GLUCOSE 100* 99 106*  BUN 29* 30* 28*  CREATININE 4.20* 4.35* 4.49*  CALCIUM 8.2* 8.3* 8.4*  PHOS 4.2 4.5 4.9*   Recent Labs  Lab 04/25/23 0413 04/28/23 0658  WBC 5.3 5.7  HGB 9.1* 8.6*  HCT 30.9* 28.1*  MCV 78.0* 76.6*  PLT 290 295

## 2023-04-29 NOTE — Procedures (Signed)
Interventional Radiology Procedure Note  Procedure: US Guided Biopsy of left kidney  Complications: None  Estimated Blood Loss: < 10 mL  Findings: 18 G core biopsy of left renal cortex performed under US guidance.  Two core samples obtained and sent to Pathology.  Jodi Marble. Fredia Sorrow, M.D Pager:  [phone]

## 2023-04-29 NOTE — Plan of Care (Signed)
Problem: Education: Goal: Knowledge of General Education information will improve Description: Including pain rating scale, medication(s)/side effects and non-pharmacologic comfort measures Outcome: Progressing Pt was admitted into the hospital for nausea for the past 2 weeks, acute worsening of abdominal pain.  She has an admitting diagnosis of hypertensive crisis.  On 04/29/2023 she had a renal bx.  She currently is awaiting discharge home to self care.    Problem: Clinical Measurements: Goal: Will remain free from infection Outcome: Progressing S/Sx of infection monitored and assessed q-shift.  Pt has remained afebrile thus far.   Problem: Clinical Measurements: Goal: Respiratory complications will improve Outcome: Progressing Respiratory status monitored and assessed q-shift.  Pt is on room air with PO2 at 94-99% and respiration rate of 16 breaths per minute.  Pt has not endorsed c/o SOB or DOE.    Problem: Clinical Measurements: Goal: Cardiovascular complication will be avoided Outcome: Progressing Pt's VS WNL except for her BP.  She was slightly hypertensive but is on antihypertensives per MD's orders.    Problem: Activity: Goal: Risk for activity intolerance will decrease Outcome: Progressing Pt is independent of all her ADLs.  She can get up OOB independently.     Problem: Nutrition: Goal: Adequate nutrition will be maintained Outcome: Progressing Pt is on a carb modified/ heart healthy diet per MD's orders and can tolerate it w/o s/sx of abdominal pain/ distention or n/v.    Problem: Safety: Goal: Ability to remain free from injury will improve Outcome: Progressing Pt has remained free from falls thus far.  Instructed pt to utilize RN call light for assistance.  Hourly rounds performed.  Bed in lowest position, locked with two upper side rails engaged.  Belongings and call light within reach.    Problem: Skin Integrity: Goal: Risk for impaired skin integrity will  decrease Outcome: Progressing Skin integrity monitored and assessed q-shift.  Instructed pt to turn q2 hours to prevent further skin impairment.  Tubes and drains assessed for device related pressures sores.  Pt is continent of both bowel and bladder.  Dressing changes performed per MD's orders.

## 2023-04-29 NOTE — Plan of Care (Signed)

## 2023-05-04 ENCOUNTER — Encounter (HOSPITAL_COMMUNITY): Payer: Self-pay

## 2023-05-04 LAB — SURGICAL PATHOLOGY

## 2023-05-12 ENCOUNTER — Encounter (INDEPENDENT_AMBULATORY_CARE_PROVIDER_SITE_OTHER): Payer: Self-pay | Admitting: *Deleted

## 2023-05-20 ENCOUNTER — Telehealth: Payer: Self-pay

## 2023-05-20 NOTE — Patient Outreach (Signed)
Renal coordinator attempted to call patient on today regarding Safe Start referral. No answer from patient after multiple rings. Coordinator left confidential voicemail for patient to return call.  Will call back patient back for final attempt.  Baruch Gouty Renal Coordinator [facility] Population Health [phone]

## 2023-06-02 ENCOUNTER — Telehealth: Payer: Self-pay

## 2023-06-02 NOTE — Patient Outreach (Signed)
Renal coordinator attempted to call patient on today regarding Safe Start referral. No answer from patient after multiple rings. Coordinator left confidential voicemail for patient to return call.  Will call back patient back for final attempt.  [facility] Assistant [place]/[facility]-Population Health [phone]

## 2023-06-06 DIAGNOSIS — N184 Chronic kidney disease, stage 4 (severe): Secondary | ICD-10-CM | POA: Diagnosis not present

## 2023-06-24 ENCOUNTER — Telehealth: Payer: Self-pay

## 2023-06-24 NOTE — Progress Notes (Signed)
Renal coordinator final attempt to call patient on today regarding Safe Start referral. No answer from patient after multiple rings. Coordinator left confidential voicemail for patient to return call.  Will send patient back to [facility] for follow up.   [facility] Assistant [place]/[facility]-Population Health [phone]

## 2023-07-11 DIAGNOSIS — N184 Chronic kidney disease, stage 4 (severe): Secondary | ICD-10-CM | POA: Diagnosis not present

## 2023-07-15 ENCOUNTER — Telehealth: Payer: Self-pay

## 2023-07-15 ENCOUNTER — Other Ambulatory Visit: Payer: Self-pay

## 2023-07-15 DIAGNOSIS — N189 Chronic kidney disease, unspecified: Secondary | ICD-10-CM | POA: Insufficient documentation

## 2023-07-15 DIAGNOSIS — D631 Anemia in chronic kidney disease: Secondary | ICD-10-CM | POA: Insufficient documentation

## 2023-07-15 NOTE — Telephone Encounter (Signed)
Auth Submission: NO AUTH NEEDED Site of care: Site of care: [facility] Payer: aetna/meritain Medication & CPT/J Code(s) submitted: Venofer (Iron Sucrose) J1756 Route of submission (phone, fax, portal): phone Phone # Fax # Auth type: Buy/Bill PB Units/visits requested: 200mg , 5 doses Reference number:  Approval from: 07/15/23 to 06/27/24

## 2023-07-18 ENCOUNTER — Encounter: Payer: No Typology Code available for payment source | Attending: Nephrology | Admitting: Emergency Medicine

## 2023-07-18 VITALS — BP 167/77 | HR 83 | Temp 98.4°F | Resp 18

## 2023-07-18 DIAGNOSIS — N189 Chronic kidney disease, unspecified: Secondary | ICD-10-CM

## 2023-07-18 DIAGNOSIS — D631 Anemia in chronic kidney disease: Secondary | ICD-10-CM | POA: Diagnosis not present

## 2023-07-18 MED ORDER — DIPHENHYDRAMINE HCL 25 MG PO CAPS
25.0000 mg | ORAL_CAPSULE | Freq: Once | ORAL | Status: AC
  Administered 2023-07-18: 25 mg via ORAL

## 2023-07-18 MED ORDER — IRON SUCROSE 20 MG/ML IV SOLN
200.0000 mg | Freq: Once | INTRAVENOUS | Status: AC
  Administered 2023-07-18: 200 mg via INTRAVENOUS

## 2023-07-18 MED ORDER — ACETAMINOPHEN 325 MG PO TABS
650.0000 mg | ORAL_TABLET | Freq: Once | ORAL | Status: AC
  Administered 2023-07-18: 650 mg via ORAL

## 2023-07-18 NOTE — Progress Notes (Signed)
Diagnosis: Iron Deficiency Anemia  Provider:  Terrial Rhodes MD  Procedure: IV Push  IV Type: Peripheral, IV Location: L Antecubital  Venofer (Iron Sucrose), Dose: 200 mg  Post Infusion IV Care: Observation period completed and Peripheral IV Discontinued  Discharge: Condition: Good, Destination: Home . AVS Provided  Performed by:  Arrie Senate, RN

## 2023-07-20 ENCOUNTER — Encounter (INDEPENDENT_AMBULATORY_CARE_PROVIDER_SITE_OTHER): Payer: No Typology Code available for payment source | Admitting: Emergency Medicine

## 2023-07-20 VITALS — BP 157/78 | HR 74 | Temp 97.8°F | Resp 20

## 2023-07-20 DIAGNOSIS — D631 Anemia in chronic kidney disease: Secondary | ICD-10-CM | POA: Diagnosis not present

## 2023-07-20 DIAGNOSIS — N189 Chronic kidney disease, unspecified: Secondary | ICD-10-CM | POA: Diagnosis not present

## 2023-07-20 MED ORDER — DIPHENHYDRAMINE HCL 25 MG PO CAPS
25.0000 mg | ORAL_CAPSULE | Freq: Once | ORAL | Status: AC
  Administered 2023-07-20: 25 mg via ORAL

## 2023-07-20 MED ORDER — IRON SUCROSE 20 MG/ML IV SOLN
200.0000 mg | Freq: Once | INTRAVENOUS | Status: AC
  Administered 2023-07-20: 200 mg via INTRAVENOUS

## 2023-07-20 MED ORDER — ACETAMINOPHEN 325 MG PO TABS
650.0000 mg | ORAL_TABLET | Freq: Once | ORAL | Status: AC
  Administered 2023-07-20: 650 mg via ORAL

## 2023-07-20 NOTE — Progress Notes (Signed)
Diagnosis: Iron Deficiency Anemia  Provider:  Terrial Rhodes MD  Procedure: IV Push  IV Type: Peripheral, IV Location: L Antecubital  Venofer (Iron Sucrose), Dose: 200 mg  Post Infusion IV Care: Observation period completed and Peripheral IV Discontinued  Discharge: Condition: Good, Destination: Home . AVS Provided  Performed by:  Arrie Senate, RN

## 2023-07-22 ENCOUNTER — Emergency Department (HOSPITAL_COMMUNITY): Payer: No Typology Code available for payment source

## 2023-07-22 ENCOUNTER — Encounter (INDEPENDENT_AMBULATORY_CARE_PROVIDER_SITE_OTHER): Payer: No Typology Code available for payment source | Admitting: Emergency Medicine

## 2023-07-22 ENCOUNTER — Other Ambulatory Visit: Payer: Self-pay

## 2023-07-22 ENCOUNTER — Encounter (HOSPITAL_COMMUNITY): Payer: Self-pay | Admitting: *Deleted

## 2023-07-22 ENCOUNTER — Inpatient Hospital Stay (HOSPITAL_COMMUNITY)
Admission: EM | Admit: 2023-07-22 | Discharge: 2023-07-26 | DRG: 291 | Disposition: A | Discharge status: Home / Self Care | Payer: No Typology Code available for payment source | Attending: Internal Medicine | Admitting: Internal Medicine

## 2023-07-22 VITALS — BP 170/80 | HR 90 | Temp 97.9°F | Resp 20

## 2023-07-22 DIAGNOSIS — I5031 Acute diastolic (congestive) heart failure: Secondary | ICD-10-CM | POA: Diagnosis present

## 2023-07-22 DIAGNOSIS — N189 Chronic kidney disease, unspecified: Secondary | ICD-10-CM | POA: Diagnosis not present

## 2023-07-22 DIAGNOSIS — I5020 Unspecified systolic (congestive) heart failure: Principal | ICD-10-CM

## 2023-07-22 DIAGNOSIS — E1122 Type 2 diabetes mellitus with diabetic chronic kidney disease: Secondary | ICD-10-CM | POA: Diagnosis present

## 2023-07-22 DIAGNOSIS — R918 Other nonspecific abnormal finding of lung field: Secondary | ICD-10-CM | POA: Diagnosis not present

## 2023-07-22 DIAGNOSIS — D631 Anemia in chronic kidney disease: Secondary | ICD-10-CM

## 2023-07-22 DIAGNOSIS — I10 Essential (primary) hypertension: Secondary | ICD-10-CM | POA: Insufficient documentation

## 2023-07-22 DIAGNOSIS — R7989 Other specified abnormal findings of blood chemistry: Secondary | ICD-10-CM | POA: Insufficient documentation

## 2023-07-22 DIAGNOSIS — E872 Acidosis, unspecified: Secondary | ICD-10-CM | POA: Diagnosis present

## 2023-07-22 DIAGNOSIS — N250 Renal osteodystrophy: Secondary | ICD-10-CM | POA: Diagnosis present

## 2023-07-22 DIAGNOSIS — R0989 Other specified symptoms and signs involving the circulatory and respiratory systems: Secondary | ICD-10-CM | POA: Diagnosis not present

## 2023-07-22 DIAGNOSIS — J9811 Atelectasis: Secondary | ICD-10-CM | POA: Diagnosis not present

## 2023-07-22 DIAGNOSIS — I509 Heart failure, unspecified: Secondary | ICD-10-CM | POA: Diagnosis not present

## 2023-07-22 DIAGNOSIS — I132 Hypertensive heart and chronic kidney disease with heart failure and with stage 5 chronic kidney disease, or end stage renal disease: Principal | ICD-10-CM | POA: Diagnosis present

## 2023-07-22 DIAGNOSIS — N179 Acute kidney failure, unspecified: Secondary | ICD-10-CM | POA: Diagnosis present

## 2023-07-22 DIAGNOSIS — K219 Gastro-esophageal reflux disease without esophagitis: Secondary | ICD-10-CM | POA: Diagnosis present

## 2023-07-22 DIAGNOSIS — Z79899 Other long term (current) drug therapy: Secondary | ICD-10-CM

## 2023-07-22 DIAGNOSIS — N185 Chronic kidney disease, stage 5: Secondary | ICD-10-CM | POA: Diagnosis present

## 2023-07-22 DIAGNOSIS — Z87891 Personal history of nicotine dependence: Secondary | ICD-10-CM

## 2023-07-22 DIAGNOSIS — J9601 Acute respiratory failure with hypoxia: Secondary | ICD-10-CM | POA: Diagnosis present

## 2023-07-22 DIAGNOSIS — Z7984 Long term (current) use of oral hypoglycemic drugs: Secondary | ICD-10-CM

## 2023-07-22 DIAGNOSIS — Z6841 Body Mass Index (BMI) 40.0 and over, adult: Secondary | ICD-10-CM

## 2023-07-22 DIAGNOSIS — R0602 Shortness of breath: Secondary | ICD-10-CM | POA: Diagnosis not present

## 2023-07-22 DIAGNOSIS — J9621 Acute and chronic respiratory failure with hypoxia: Secondary | ICD-10-CM | POA: Insufficient documentation

## 2023-07-22 DIAGNOSIS — I161 Hypertensive emergency: Secondary | ICD-10-CM | POA: Diagnosis present

## 2023-07-22 DIAGNOSIS — I482 Chronic atrial fibrillation, unspecified: Secondary | ICD-10-CM | POA: Diagnosis present

## 2023-07-22 DIAGNOSIS — N269 Renal sclerosis, unspecified: Secondary | ICD-10-CM | POA: Diagnosis present

## 2023-07-22 DIAGNOSIS — I110 Hypertensive heart disease with heart failure: Secondary | ICD-10-CM | POA: Diagnosis not present

## 2023-07-22 DIAGNOSIS — Z7901 Long term (current) use of anticoagulants: Secondary | ICD-10-CM

## 2023-07-22 HISTORY — DX: Chronic kidney disease, unspecified: N18.9

## 2023-07-22 LAB — BASIC METABOLIC PANEL
Anion gap: 10 (ref 5–15)
BUN: 39 mg/dL — ABNORMAL HIGH (ref 6–20)
CO2: 18 mmol/L — ABNORMAL LOW (ref 22–32)
Calcium: 8.7 mg/dL — ABNORMAL LOW (ref 8.9–10.3)
Chloride: 112 mmol/L — ABNORMAL HIGH (ref 98–111)
Creatinine, Ser: 4.65 mg/dL — ABNORMAL HIGH (ref 0.44–1.00)
GFR, Estimated: 11 mL/min — ABNORMAL LOW (ref >60)
Glucose, Bld: 149 mg/dL — ABNORMAL HIGH (ref 70–99)
Potassium: 4.2 mmol/L (ref 3.5–5.1)
Sodium: 140 mmol/L (ref 135–145)

## 2023-07-22 LAB — CBC WITH DIFFERENTIAL/PLATELET
Abs Immature Granulocytes: 0.06 10*3/uL (ref 0.00–0.07)
Basophils Absolute: 0 10*3/uL (ref 0.0–0.1)
Basophils Relative: 0 %
Eosinophils Absolute: 0.4 10*3/uL (ref 0.0–0.5)
Eosinophils Relative: 6 %
HCT: 25.6 % — ABNORMAL LOW (ref 36.0–46.0)
Hemoglobin: 7.7 g/dL — ABNORMAL LOW (ref 12.0–15.0)
Immature Granulocytes: 1 %
Lymphocytes Relative: 12 %
Lymphs Abs: 0.9 10*3/uL (ref 0.7–4.0)
MCH: 24.4 pg — ABNORMAL LOW (ref 26.0–34.0)
MCHC: 30.1 g/dL (ref 30.0–36.0)
MCV: 81.3 fL (ref 80.0–100.0)
Monocytes Absolute: 0.7 10*3/uL (ref 0.1–1.0)
Monocytes Relative: 10 %
Neutro Abs: 5.4 10*3/uL (ref 1.7–7.7)
Neutrophils Relative %: 71 %
Platelets: 346 10*3/uL (ref 150–400)
RBC: 3.15 MIL/uL — ABNORMAL LOW (ref 3.87–5.11)
RDW: 21.2 % — ABNORMAL HIGH (ref 11.5–15.5)
WBC: 7.6 10*3/uL (ref 4.0–10.5)
nRBC: 0.3 % — ABNORMAL HIGH (ref 0.0–0.2)

## 2023-07-22 MED ORDER — DIPHENHYDRAMINE HCL 25 MG PO CAPS
25.0000 mg | ORAL_CAPSULE | Freq: Once | ORAL | Status: AC
  Administered 2023-07-22: 25 mg via ORAL

## 2023-07-22 MED ORDER — IRON SUCROSE 20 MG/ML IV SOLN
200.0000 mg | Freq: Once | INTRAVENOUS | Status: AC
  Administered 2023-07-22: 200 mg via INTRAVENOUS

## 2023-07-22 MED ORDER — ACETAMINOPHEN 325 MG PO TABS
650.0000 mg | ORAL_TABLET | Freq: Once | ORAL | Status: AC
  Administered 2023-07-22: 650 mg via ORAL

## 2023-07-22 MED ORDER — FUROSEMIDE 10 MG/ML IJ SOLN
40.0000 mg | Freq: Once | INTRAMUSCULAR | Status: AC
  Administered 2023-07-22: 40 mg via INTRAVENOUS
  Filled 2023-07-22: qty 4

## 2023-07-22 NOTE — ED Notes (Signed)
Patient provided with warm blankets.

## 2023-07-22 NOTE — ED Notes (Signed)
EDMD at bedside

## 2023-07-22 NOTE — ED Notes (Addendum)
Pt given sandwich and diet beverage per Dr Estell Harpin ok-

## 2023-07-22 NOTE — ED Notes (Signed)
ED Provider at bedside. Dr Estell Harpin

## 2023-07-22 NOTE — Progress Notes (Signed)
Diagnosis: Iron Deficiency Anemia  Provider:  Terrial Rhodes MD  Procedure: IV Push  IV Type: Peripheral, IV Location: L Antecubital  Venofer (Iron Sucrose), Dose: 200 mg  Post Infusion IV Care: Observation period completed and Peripheral IV Discontinued  Discharge: Condition: Good, Destination: Home . AVS Provided  Performed by:  Arrie Senate, RN

## 2023-07-22 NOTE — ED Triage Notes (Signed)
Pt states SOB for awhile but this week SOB is different, worse with minimal exertion and coughing. Denies any CP. Denies fevers.

## 2023-07-23 ENCOUNTER — Inpatient Hospital Stay (HOSPITAL_COMMUNITY): Payer: No Typology Code available for payment source

## 2023-07-23 DIAGNOSIS — Z6841 Body Mass Index (BMI) 40.0 and over, adult: Secondary | ICD-10-CM

## 2023-07-23 DIAGNOSIS — I509 Heart failure, unspecified: Secondary | ICD-10-CM

## 2023-07-23 DIAGNOSIS — N185 Chronic kidney disease, stage 5: Secondary | ICD-10-CM | POA: Diagnosis present

## 2023-07-23 DIAGNOSIS — Z7984 Long term (current) use of oral hypoglycemic drugs: Secondary | ICD-10-CM | POA: Diagnosis not present

## 2023-07-23 DIAGNOSIS — N179 Acute kidney failure, unspecified: Secondary | ICD-10-CM | POA: Diagnosis not present

## 2023-07-23 DIAGNOSIS — I10 Essential (primary) hypertension: Secondary | ICD-10-CM | POA: Insufficient documentation

## 2023-07-23 DIAGNOSIS — D631 Anemia in chronic kidney disease: Secondary | ICD-10-CM | POA: Diagnosis present

## 2023-07-23 DIAGNOSIS — N269 Renal sclerosis, unspecified: Secondary | ICD-10-CM | POA: Diagnosis present

## 2023-07-23 DIAGNOSIS — I132 Hypertensive heart and chronic kidney disease with heart failure and with stage 5 chronic kidney disease, or end stage renal disease: Secondary | ICD-10-CM | POA: Diagnosis present

## 2023-07-23 DIAGNOSIS — I5021 Acute systolic (congestive) heart failure: Secondary | ICD-10-CM

## 2023-07-23 DIAGNOSIS — Z87891 Personal history of nicotine dependence: Secondary | ICD-10-CM | POA: Diagnosis not present

## 2023-07-23 DIAGNOSIS — I161 Hypertensive emergency: Secondary | ICD-10-CM

## 2023-07-23 DIAGNOSIS — E872 Acidosis, unspecified: Secondary | ICD-10-CM | POA: Diagnosis present

## 2023-07-23 DIAGNOSIS — I5031 Acute diastolic (congestive) heart failure: Secondary | ICD-10-CM | POA: Diagnosis present

## 2023-07-23 DIAGNOSIS — E1122 Type 2 diabetes mellitus with diabetic chronic kidney disease: Secondary | ICD-10-CM | POA: Diagnosis present

## 2023-07-23 DIAGNOSIS — K219 Gastro-esophageal reflux disease without esophagitis: Secondary | ICD-10-CM | POA: Diagnosis present

## 2023-07-23 DIAGNOSIS — N250 Renal osteodystrophy: Secondary | ICD-10-CM | POA: Diagnosis present

## 2023-07-23 DIAGNOSIS — N189 Chronic kidney disease, unspecified: Secondary | ICD-10-CM

## 2023-07-23 DIAGNOSIS — J9621 Acute and chronic respiratory failure with hypoxia: Secondary | ICD-10-CM | POA: Insufficient documentation

## 2023-07-23 DIAGNOSIS — J9601 Acute respiratory failure with hypoxia: Secondary | ICD-10-CM | POA: Diagnosis not present

## 2023-07-23 DIAGNOSIS — R7989 Other specified abnormal findings of blood chemistry: Secondary | ICD-10-CM | POA: Insufficient documentation

## 2023-07-23 DIAGNOSIS — Z79899 Other long term (current) drug therapy: Secondary | ICD-10-CM | POA: Diagnosis not present

## 2023-07-23 DIAGNOSIS — Z7901 Long term (current) use of anticoagulants: Secondary | ICD-10-CM | POA: Diagnosis not present

## 2023-07-23 DIAGNOSIS — I482 Chronic atrial fibrillation, unspecified: Secondary | ICD-10-CM | POA: Diagnosis present

## 2023-07-23 LAB — ECHOCARDIOGRAM COMPLETE
AR max vel: 1.78 cm2
AV Area VTI: 1.81 cm2
AV Area mean vel: 1.88 cm2
AV Mean grad: 7 mm[Hg]
AV Peak grad: 14 mm[Hg]
Ao pk vel: 1.87 m/s
Area-P 1/2: 3.6 cm2
Height: 65.25 in
S' Lateral: 2.3 cm
Weight: 4941.83 [oz_av]

## 2023-07-23 LAB — COMPREHENSIVE METABOLIC PANEL
ALT: 18 U/L (ref 0–44)
AST: 15 U/L (ref 15–41)
Albumin: 1.7 g/dL — ABNORMAL LOW (ref 3.5–5.0)
Alkaline Phosphatase: 45 U/L (ref 38–126)
Anion gap: 8 (ref 5–15)
BUN: 38 mg/dL — ABNORMAL HIGH (ref 6–20)
CO2: 19 mmol/L — ABNORMAL LOW (ref 22–32)
Calcium: 8.6 mg/dL — ABNORMAL LOW (ref 8.9–10.3)
Chloride: 116 mmol/L — ABNORMAL HIGH (ref 98–111)
Creatinine, Ser: 4.49 mg/dL — ABNORMAL HIGH (ref 0.44–1.00)
GFR, Estimated: 12 mL/min — ABNORMAL LOW (ref >60)
Glucose, Bld: 110 mg/dL — ABNORMAL HIGH (ref 70–99)
Potassium: 3.9 mmol/L (ref 3.5–5.1)
Sodium: 143 mmol/L (ref 135–145)
Total Bilirubin: 0.4 mg/dL (ref 0.0–1.2)
Total Protein: 5.2 g/dL — ABNORMAL LOW (ref 6.5–8.1)

## 2023-07-23 LAB — CBC
HCT: 23.2 % — ABNORMAL LOW (ref 36.0–46.0)
Hemoglobin: 6.9 g/dL — CL (ref 12.0–15.0)
MCH: 24.4 pg — ABNORMAL LOW (ref 26.0–34.0)
MCHC: 29.7 g/dL — ABNORMAL LOW (ref 30.0–36.0)
MCV: 82 fL (ref 80.0–100.0)
Platelets: 300 10*3/uL (ref 150–400)
RBC: 2.83 MIL/uL — ABNORMAL LOW (ref 3.87–5.11)
RDW: 21 % — ABNORMAL HIGH (ref 11.5–15.5)
WBC: 7.1 10*3/uL (ref 4.0–10.5)
nRBC: 0.7 % — ABNORMAL HIGH (ref 0.0–0.2)

## 2023-07-23 LAB — MAGNESIUM: Magnesium: 2 mg/dL (ref 1.7–2.4)

## 2023-07-23 LAB — HEMOGLOBIN AND HEMATOCRIT, BLOOD
HCT: 24.3 % — ABNORMAL LOW (ref 36.0–46.0)
Hemoglobin: 7 g/dL — ABNORMAL LOW (ref 12.0–15.0)

## 2023-07-23 LAB — VITAMIN B12: Vitamin B-12: 533 pg/mL (ref 180–914)

## 2023-07-23 LAB — RETICULOCYTES
Immature Retic Fract: 37.4 % — ABNORMAL HIGH (ref 2.3–15.9)
RBC.: 2.9 MIL/uL — ABNORMAL LOW (ref 3.87–5.11)
Retic Count, Absolute: 133.7 10*3/uL (ref 19.0–186.0)
Retic Ct Pct: 4.6 % — ABNORMAL HIGH (ref 0.4–3.1)

## 2023-07-23 LAB — IRON AND TIBC
Iron: 43 ug/dL (ref 28–170)
Saturation Ratios: 18 % (ref 10.4–31.8)
TIBC: 240 ug/dL — ABNORMAL LOW (ref 250–450)
UIBC: 197 ug/dL

## 2023-07-23 LAB — FERRITIN: Ferritin: 143 ng/mL (ref 11–307)

## 2023-07-23 LAB — FOLATE: Folate: 7.5 ng/mL (ref >5.9)

## 2023-07-23 LAB — BRAIN NATRIURETIC PEPTIDE: B Natriuretic Peptide: 412 pg/mL — ABNORMAL HIGH (ref 0.0–100.0)

## 2023-07-23 LAB — ABO/RH: ABO/RH(D): A POS

## 2023-07-23 LAB — PHOSPHORUS: Phosphorus: 5.1 mg/dL — ABNORMAL HIGH (ref 2.5–4.6)

## 2023-07-23 MED ORDER — ONDANSETRON HCL 4 MG PO TABS: 4.0000 mg | ORAL_TABLET | Freq: Four times a day (QID) | ORAL | Status: DC | PRN

## 2023-07-23 MED ORDER — APIXABAN 5 MG PO TABS: 5.0000 mg | ORAL_TABLET | Freq: Two times a day (BID) | ORAL | Status: DC

## 2023-07-23 MED ORDER — ACETAMINOPHEN 325 MG PO TABS
650.0000 mg | ORAL_TABLET | Freq: Four times a day (QID) | ORAL | Status: DC | PRN
Start: 2023-07-23 — End: 2023-07-26

## 2023-07-23 MED ORDER — DARBEPOETIN ALFA 100 MCG/0.5ML IJ SOSY
100.0000 ug | PREFILLED_SYRINGE | INTRAMUSCULAR | Status: DC
  Administered 2023-07-23: 100 ug via SUBCUTANEOUS
  Filled 2023-07-23: qty 0.5

## 2023-07-23 MED ORDER — HEPARIN SODIUM (PORCINE) 5000 UNIT/ML IJ SOLN
5000.0000 [IU] | Freq: Three times a day (TID) | INTRAMUSCULAR | Status: DC
  Filled 2023-07-23: qty 1

## 2023-07-23 MED ORDER — ACETAMINOPHEN 650 MG RE SUPP: 650.0000 mg | Freq: Four times a day (QID) | RECTAL | Status: DC | PRN

## 2023-07-23 MED ORDER — FUROSEMIDE 10 MG/ML IJ SOLN
80.0000 mg | Freq: Three times a day (TID) | INTRAMUSCULAR | Status: DC
  Administered 2023-07-23 – 2023-07-25 (×7): 80 mg via INTRAVENOUS
  Filled 2023-07-23 (×7): qty 8

## 2023-07-23 MED ORDER — AMLODIPINE BESYLATE 5 MG PO TABS
10.0000 mg | ORAL_TABLET | Freq: Every day | ORAL | Status: DC
  Administered 2023-07-23 – 2023-07-26 (×4): 10 mg via ORAL
  Filled 2023-07-23 (×4): qty 2

## 2023-07-23 MED ORDER — ONDANSETRON HCL 4 MG/2ML IJ SOLN: 4.0000 mg | Freq: Four times a day (QID) | INTRAMUSCULAR | Status: DC | PRN

## 2023-07-23 MED ORDER — HYDRALAZINE HCL 25 MG PO TABS
100.0000 mg | ORAL_TABLET | Freq: Three times a day (TID) | ORAL | Status: DC
  Administered 2023-07-23 – 2023-07-26 (×10): 100 mg via ORAL
  Filled 2023-07-23 (×10): qty 4

## 2023-07-23 MED ORDER — PERFLUTREN LIPID MICROSPHERE
1.0000 mL | INTRAVENOUS | Status: AC | PRN
  Administered 2023-07-23: 3 mL via INTRAVENOUS

## 2023-07-23 MED ORDER — FUROSEMIDE 10 MG/ML IJ SOLN: 40.0000 mg | Freq: Two times a day (BID) | INTRAMUSCULAR | Status: DC

## 2023-07-23 MED ORDER — PANTOPRAZOLE SODIUM 40 MG PO TBEC
40.0000 mg | DELAYED_RELEASE_TABLET | Freq: Every day | ORAL | Status: DC
  Administered 2023-07-23 – 2023-07-26 (×4): 40 mg via ORAL
  Filled 2023-07-23 (×4): qty 1

## 2023-07-23 MED ORDER — METOPROLOL SUCCINATE ER 25 MG PO TB24
100.0000 mg | ORAL_TABLET | Freq: Every day | ORAL | Status: DC
  Administered 2023-07-23 – 2023-07-26 (×4): 100 mg via ORAL
  Filled 2023-07-23 (×5): qty 2
  Filled 2023-07-23: qty 4
  Filled 2023-07-23: qty 2

## 2023-07-23 MED ORDER — POLYETHYLENE GLYCOL 3350 17 G PO PACK
17.0000 g | PACK | Freq: Every day | ORAL | Status: DC
  Administered 2023-07-23 – 2023-07-26 (×4): 17 g via ORAL
  Filled 2023-07-23 (×4): qty 1

## 2023-07-23 NOTE — Progress Notes (Signed)
PROGRESS NOTE    Melissa [name]  ZOX:[id number] DOB: 1975-11-21 DOA: 07/22/2023 PCP: Shelby Dubin, FNP   Brief Narrative:    Melissa [name] is a 48 y.o. female with medical history significant of hypertension, atrial fibrillation on Eliquis, type 2 diabetes mellitus, CKD 5, obesity who presents to the emergency department due to 1 week onset of shortness of breath.  She has been admitted with volume overload with left-sided pleural effusion and suspicion of new CHF.  She also has AKI on CKD stage IV and has been started on IV Lasix for diuresis.  Her hemoglobin is also demonstrating a downward trend and her home Eliquis has been held.  Assessment & Plan:   Principal Problem:   Pleural effusion due to CHF (congestive heart failure) (HCC) Active Problems:   Acute kidney injury superimposed on stage 5 chronic kidney disease, not on chronic dialysis (HCC)   Atrial fibrillation, chronic (HCC)   Hypertensive emergency   Morbid obesity with BMI of 50.0-59.9, adult (HCC)   Elevated brain natriuretic peptide (BNP) level   Acute respiratory failure with hypoxia (HCC)   Essential hypertension  Assessment and Plan:   Volume overload in the setting of worsening CKD with concern for new CHF Left-sided pleural effusion Elevated BNP BNP 412 Patient with trace left pleural effusion and chest x-ray suggestive of interstitial edema She denies any prior diagnosis of congestive heart failure Continue total input/output, daily weights and fluid restriction Continue IV Lasix 80 mg every 8 hours per nephrology recommendations Continue heart healthy/modified carb diet       Echocardiogram ordered and pending   Acute respiratory failure with hypoxia Patient was reported to be hypoxic in the ED and required supplemental oxygen at 2 LPM to maintain O2 sat of 91 to 95%. Continue supplemental oxygen and plan to wean patient off this as tolerated (patient does not use supplemental oxygen at  baseline)  Worsening anemia Likely secondary to CKD, Aranesp ordered per nephrology Hold Eliquis Anemia panel WNL, has received iron infusions recently Check stool occult   Hypertensive emergency (resolved) Essential hypertension (uncontrolled) Continue amlodipine, hydralazine, metoprolol   AKI on CKD stage V Creatinine 4.65; baseline creatinine 3.7-4.1 Renally adjust medications, avoid nephrotoxic agents/dehydration/hypotension Nephrology will be consulted and we shall await further recommendation   Morbid obesity (BMI 51.19) Patient was counseled about the cardiovascular and metabolic risk of morbid obesity. Patient was counseled for diet control, exercise regimen and weight loss.    Chronic atrial fibrillation Continue metoprolol Eliquis held due to concerns for worsening anemia   DVT prophylaxis: SCDs Code Status: Full Family Communication: None at bedside Disposition Plan:  Status is: Inpatient Remains inpatient appropriate because: Need for IV medications.  Consultants:  Nephrology  Procedures:  None  Antimicrobials:  None   Subjective: Patient seen and evaluated today with no new acute complaints or concerns. No acute concerns or events noted overnight.  She denies much urine output.  Objective: Vitals:   07/22/23 2351 07/23/23 0000 07/23/23 0142 07/23/23 0500  BP: (!) 154/74 (!) 172/93 (!) 174/94   Pulse: 85 82 85   Resp: (!) 21 17 20    Temp:   97.6 F (36.4 C)   TempSrc:   Oral   SpO2: 95% 96% 98%   Weight:    (!) 140.1 kg  Height:       No intake or output data in the 24 hours ending 07/23/23 0718 Filed Weights   07/22/23 1810 07/23/23 0500  Weight: Marland Kitchen)  140.6 kg (!) 140.1 kg    Examination:  General exam: Appears calm and comfortable  Respiratory system: Clear to auscultation. Respiratory effort normal.  Nasal cannula oxygen Cardiovascular system: S1 & S2 heard, RRR.  Gastrointestinal system: Abdomen is soft Central nervous system: Alert  and awake Extremities: No edema Skin: No significant lesions noted Psychiatry: Flat affect.    Data Reviewed: I have personally reviewed following labs and imaging studies  CBC: Recent Labs  Lab 07/22/23 1914 07/23/23 0536  WBC 7.6 7.1  NEUTROABS 5.4  --   HGB 7.7* 6.9*  HCT 25.6* 23.2*  MCV 81.3 82.0  PLT 346 300   Basic Metabolic Panel: Recent Labs  Lab 07/22/23 1914 07/23/23 0536  NA 140 143  K 4.2 3.9  CL 112* 116*  CO2 18* 19*  GLUCOSE 149* 110*  BUN 39* 38*  CREATININE 4.65* 4.49*  CALCIUM 8.7* 8.6*  MG  --  2.0  PHOS  --  5.1*   GFR: Estimated Creatinine Clearance: 22.2 mL/min (A) (by C-G formula based on SCr of 4.49 mg/dL (H)). Liver Function Tests: Recent Labs  Lab 07/23/23 0536  AST 15  ALT 18  ALKPHOS 45  BILITOT 0.4  PROT 5.2*  ALBUMIN 1.7*   No results for input(s): "LIPASE", "AMYLASE" in the last 168 hours. No results for input(s): "AMMONIA" in the last 168 hours. Coagulation Profile: No results for input(s): "INR", "PROTIME" in the last 168 hours. Cardiac Enzymes: No results for input(s): "CKTOTAL", "CKMB", "CKMBINDEX", "TROPONINI" in the last 168 hours. BNP (last 3 results) No results for input(s): "PROBNP" in the last 8760 hours. HbA1C: No results for input(s): "HGBA1C" in the last 72 hours. CBG: No results for input(s): "GLUCAP" in the last 168 hours. Lipid Profile: No results for input(s): "CHOL", "HDL", "LDLCALC", "TRIG", "CHOLHDL", "LDLDIRECT" in the last 72 hours. Thyroid Function Tests: No results for input(s): "TSH", "T4TOTAL", "FREET4", "T3FREE", "THYROIDAB" in the last 72 hours. Anemia Panel: No results for input(s): "VITAMINB12", "FOLATE", "FERRITIN", "TIBC", "IRON", "RETICCTPCT" in the last 72 hours. Sepsis Labs: No results for input(s): "PROCALCITON", "LATICACIDVEN" in the last 168 hours.  No results found for this or any previous visit (from the past 240 hours).       Radiology Studies: DG Chest 2 View Result  Date: 07/22/2023 CLINICAL DATA:  Shortness of breath. EXAM: CHEST - 2 VIEW COMPARISON:  Chest radiograph dated Nov 09, 2021. FINDINGS: Cardiomegaly with pulmonary vascular congestion. Diffuse bilateral interstitial opacities. A more focal confluent opacity is noted at the left lateral mid lung. Mild left basilar atelectasis with possible trace left effusion. No pneumothorax. No acute osseous abnormality. IMPRESSION: 1. Cardiomegaly with pulmonary vascular congestion and findings suggestive of interstitial edema. 2. Mild left basilar atelectasis with possible trace left pleural effusion. 3. A more focal confluent opacity at the left lateral mid lung could reflect edema or infiltrate. Electronically Signed   By: Hart Robinsons M.D.   On: 07/22/2023 18:39        Scheduled Meds:  amLODipine  10 mg Oral Daily   furosemide  80 mg Intravenous Q8H   hydrALAZINE  100 mg Oral Q8H   pantoprazole  40 mg Oral Daily     LOS: 0 days    Time spent: 55 minutes    Veronia Laprise Hoover Brunette, DO [facility]  If 7PM-7AM, please contact night-coverage [email or web address] 07/23/2023, 7:18 AM

## 2023-07-23 NOTE — Progress Notes (Signed)
Echocardiogram 2D Echocardiogram has been performed.  Melissa [name] Melissa [name] 07/23/2023, 11:26 AM

## 2023-07-23 NOTE — Plan of Care (Signed)

## 2023-07-23 NOTE — Progress Notes (Signed)
Patient arrived to room 336 from ED.  Assessment complete, VS obtained, and Admission database began.

## 2023-07-23 NOTE — Progress Notes (Signed)
   07/23/23 0817  TOC Brief Assessment  Insurance and Status Reviewed  Patient has primary care physician Yes  Home environment has been reviewed From home  Prior level of function: Independent  Prior/Current Home Services No current home services  Social Drivers of Health Review SDOH reviewed no interventions necessary  Readmission risk has been reviewed Yes (Yellow)  Transition of care needs no transition of care needs at this time      Transition of Care Department [facility]) has reviewed patient and no TOC needs have been identified at this time. We will continue to monitor patient advancement through interdisciplinary progression rounds. If new patient transition needs arise, please place a TOC consult.

## 2023-07-23 NOTE — ED Provider Notes (Signed)
[place] EMERGENCY DEPARTMENT AT [facility] Provider Note   CSN: [id number] Arrival date & time: 07/22/23  1745     History {Add pertinent medical, surgical, social history, OB history to HPI:1} Chief Complaint  Patient presents with   Shortness of Breath    Melissa [name] is a 48 y.o. female.  Patient complains of shortness of breath.  Patient has a history of diabetes and hypertension patient is usually not on oxygen but needs 2 L now   Shortness of Breath      Home Medications Prior to Admission medications   Medication Sig Start Date End Date Taking? Authorizing Provider  amLODipine (NORVASC) 10 MG tablet Take 10 mg by mouth daily. 12/16/21   [provider]  cetirizine (ZYRTEC) 10 MG tablet Take 10 mg by mouth daily. 09/16/20   [provider]  citalopram (CELEXA) 20 MG tablet Take 20 mg by mouth daily. 05/07/19   [provider]  ELIQUIS 5 MG TABS tablet Take 1 tablet (5 mg total) by mouth 2 (two) times daily. Resume from 04/30/2023 04/30/23   Glade Lloyd, MD  ferrous sulfate 325 (65 FE) MG tablet Take 325 mg by mouth daily. Patient not taking: Reported on 04/19/2023    [provider]  hydrALAZINE (APRESOLINE) 100 MG tablet Take 1 tablet (100 mg total) by mouth every 8 (eight) hours. 04/29/23   Glade Lloyd, MD  JENCYCLA 0.35 MG tablet Take 1 tablet by mouth daily. 11/25/21   [provider]  metoprolol succinate (TOPROL-XL) 100 MG 24 hr tablet Take 1 tablet (100 mg total) by mouth daily. Take with or immediately following a meal. 04/30/23   Glade Lloyd, MD  pantoprazole (PROTONIX) 40 MG tablet Take 40 mg by mouth daily.    [provider]  pioglitazone (ACTOS) 15 MG tablet Take 15 mg by mouth daily.    [provider]      Allergies    Patient has no known allergies.    Review of Systems   Review of Systems  Respiratory:  Positive for shortness of breath.     Physical Exam Updated  Vital Signs BP (!) 154/74 (BP Location: Left Arm) Comment (BP Location): reg cuff to forearm placed after large cuff to upper arm removed-this cuff did not fit appropriately -Dr Estell Harpin aware.  Pulse 85   Temp 97.8 F (36.6 C) (Oral)   Resp (!) 21   Ht 5' 5.25" (1.657 m)   Wt (!) 140.6 kg   LMP 06/21/2023 (Approximate)   SpO2 95%   BMI 51.19 kg/m  Physical Exam  ED Results / Procedures / Treatments   Labs (all labs ordered are listed, but only abnormal results are displayed) Labs Reviewed  CBC WITH DIFFERENTIAL/PLATELET - Abnormal; Notable for the following components:      Result Value   RBC 3.15 (*)    Hemoglobin 7.7 (*)    HCT 25.6 (*)    MCH 24.4 (*)    RDW 21.2 (*)    nRBC 0.3 (*)    All other components within normal limits  BASIC METABOLIC PANEL - Abnormal; Notable for the following components:   Chloride 112 (*)    CO2 18 (*)    Glucose, Bld 149 (*)    BUN 39 (*)    Creatinine, Ser 4.65 (*)    Calcium 8.7 (*)    GFR, Estimated 11 (*)    All other components within normal limits  BRAIN NATRIURETIC PEPTIDE  EKG None  Radiology DG Chest 2 View Result Date: 07/22/2023 CLINICAL DATA:  Shortness of breath. EXAM: CHEST - 2 VIEW COMPARISON:  Chest radiograph dated Nov 09, 2021. FINDINGS: Cardiomegaly with pulmonary vascular congestion. Diffuse bilateral interstitial opacities. A more focal confluent opacity is noted at the left lateral mid lung. Mild left basilar atelectasis with possible trace left effusion. No pneumothorax. No acute osseous abnormality. IMPRESSION: 1. Cardiomegaly with pulmonary vascular congestion and findings suggestive of interstitial edema. 2. Mild left basilar atelectasis with possible trace left pleural effusion. 3. A more focal confluent opacity at the left lateral mid lung could reflect edema or infiltrate. Electronically Signed   By: Hart Robinsons M.D.   On: 07/22/2023 18:39    Procedures Procedures  {Document cardiac monitor, telemetry  assessment procedure when appropriate:1}  Medications Ordered in ED Medications  furosemide (LASIX) injection 40 mg (40 mg Intravenous Given 07/22/23 2256)    ED Course/ Medical Decision Making/ A&P   {   Click here for ABCD2, HEART and other calculatorsREFRESH Note before signing :1}                              Medical Decision Making Amount and/or Complexity of Data Reviewed Labs: ordered. Radiology: ordered.  Risk Prescription drug management.   Patient with congestive heart failure.  She will be admitted to medicine for diuresis  {Document critical care time when appropriate:1} {Document review of labs and clinical decision tools ie heart score, Chads2Vasc2 etc:1}  {Document your independent review of radiology images, and any outside records:1} {Document your discussion with family members, caretakers, and with consultants:1} {Document social determinants of health affecting pt's care:1} {Document your decision making why or why not admission, treatments were needed:1} Final Clinical Impression(s) / ED Diagnoses Final diagnoses:  None    Rx / DC Orders ED Discharge Orders     None

## 2023-07-23 NOTE — H&P (Addendum)
History and Physical    Patient: Melissa [name] VHQ:[id number] DOB: [birth year] DOA: 07/22/2023 DOS: the patient was seen and examined on 07/23/2023 PCP: Shelby Dubin, FNP  Patient coming from: Home  Chief Complaint:  Chief Complaint  Patient presents with   Shortness of Breath   HPI: Melissa [name] is a 48 y.o. female with medical history significant of hypertension, type 2 diabetes mellitus, obesity who presents to the emergency department due to 1 week onset of shortness of breath.  Patient states that she barely can go from her bedroom to restroom which is just a few feet away without being short of breath.  She states that she usually have fluid buildup in the legs which has not changed.  She denies chest pain or fever.  ED Course:  In the emergency department, BP was 203/99, other vital signs were within normal range.  Workup in the ED showed normocytic anemia, BMP showed blood glucose of 149, sodium 140, potassium 4.2, chloride 112, bicarb 18, BUN 39, creatinine 4.65, EGFR 11, BNP 412 Chest x-ray showed cardiomegaly with pulmonary vascular congestion and findings suggestive of interstitial edema.  Mild left basilar atelectasis with possible trace left pleural effusion Patient was treated with IV Lasix 40 mg x 1.  Hospitalist was asked to admit patient for further evaluation and management.   Review of Systems: Review of systems as noted in the HPI. All other systems reviewed and are negative.   Past Medical History:  Diagnosis Date   Anemia    Anxiety    Chronic kidney disease    Complication of anesthesia    Diabetes mellitus without complication (HCC)    Dysrhythmia    GERD (gastroesophageal reflux disease)    History of hiatal hernia    Hypertension    PONV (postoperative nausea and vomiting)    Past Surgical History:  Procedure Laterality Date   BIOPSY  02/02/2022   Procedure: BIOPSY;  Surgeon: Dolores Frame, MD;  Location: [facility];  Service:  Gastroenterology;;   ESOPHAGOGASTRODUODENOSCOPY (EGD) WITH PROPOFOL N/A 02/02/2022   Procedure: ESOPHAGOGASTRODUODENOSCOPY (EGD) WITH PROPOFOL;  Surgeon: Dolores Frame, MD;  Location: [facility];  Service: Gastroenterology;  Laterality: N/A;  240   NO PAST SURGERIES      Social History:  reports that she has quit smoking. Her smoking use included cigarettes. She has been exposed to tobacco smoke. She has quit using smokeless tobacco. She reports current alcohol use. She reports that she does not use drugs.   No Known Allergies  History reviewed. No pertinent family history.   Prior to Admission medications   Medication Sig Start Date End Date Taking? Authorizing Provider  amLODipine (NORVASC) 10 MG tablet Take 10 mg by mouth daily. 12/16/21   [provider]  cetirizine (ZYRTEC) 10 MG tablet Take 10 mg by mouth daily. 09/16/20   [provider]  citalopram (CELEXA) 20 MG tablet Take 20 mg by mouth daily. 05/07/19   [provider]  ELIQUIS 5 MG TABS tablet Take 1 tablet (5 mg total) by mouth 2 (two) times daily. Resume from 04/30/2023 04/30/23   Glade Lloyd, MD  ferrous sulfate 325 (65 FE) MG tablet Take 325 mg by mouth daily. Patient not taking: Reported on 04/19/2023    [provider]  hydrALAZINE (APRESOLINE) 100 MG tablet Take 1 tablet (100 mg total) by mouth every 8 (eight) hours. 04/29/23   Glade Lloyd, MD  JENCYCLA 0.35 MG tablet Take 1 tablet  by mouth daily. 11/25/21   [provider]  metoprolol succinate (TOPROL-XL) 100 MG 24 hr tablet Take 1 tablet (100 mg total) by mouth daily. Take with or immediately following a meal. 04/30/23   Glade Lloyd, MD  pantoprazole (PROTONIX) 40 MG tablet Take 40 mg by mouth daily.    [provider]  pioglitazone (ACTOS) 15 MG tablet Take 15 mg by mouth daily.    [provider]    Physical Exam: BP (!) 174/94   Pulse 85   Temp 97.6 F (36.4 C) (Oral)   Resp 20    Ht 5' 5.25" (1.657 m)   Wt (!) 140.6 kg   LMP 06/21/2023 (Approximate)   SpO2 98%   BMI 51.19 kg/m   General: 48 y.o. year-old female well developed well nourished in no acute distress.  Alert and oriented x3. HEENT: NCAT, EOMI Neck: Supple, trachea medial Cardiovascular: Regular rate and rhythm with no rubs or gallops.  No thyromegaly or JVD noted.  No lower extremity edema. 2/4 pulses in all 4 extremities. Respiratory: Diffuse Rales in lower lobes on auscultation with no wheezes.  Abdomen: Soft, nontender nondistended with normal bowel sounds x4 quadrants. Muskuloskeletal: No cyanosis, clubbing or edema noted bilaterally Neuro: CN II-XII intact, strength 5/5 x 4, sensation, reflexes intact Skin: No ulcerative lesions noted or rashes Psychiatry: Mood is appropriate for condition and setting          Labs on Admission:  Basic Metabolic Panel: Recent Labs  Lab 07/22/23 1914  NA 140  K 4.2  CL 112*  CO2 18*  GLUCOSE 149*  BUN 39*  CREATININE 4.65*  CALCIUM 8.7*   Liver Function Tests: No results for input(s): "AST", "ALT", "ALKPHOS", "BILITOT", "PROT", "ALBUMIN" in the last 168 hours. No results for input(s): "LIPASE", "AMYLASE" in the last 168 hours. No results for input(s): "AMMONIA" in the last 168 hours. CBC: Recent Labs  Lab 07/22/23 1914  WBC 7.6  NEUTROABS 5.4  HGB 7.7*  HCT 25.6*  MCV 81.3  PLT 346   Cardiac Enzymes: No results for input(s): "CKTOTAL", "CKMB", "CKMBINDEX", "TROPONINI" in the last 168 hours.  BNP (last 3 results) Recent Labs    07/23/23 0018  BNP 412.0*    ProBNP (last 3 results) No results for input(s): "PROBNP" in the last 8760 hours.  CBG: No results for input(s): "GLUCAP" in the last 168 hours.  Radiological Exams on Admission: DG Chest 2 View Result Date: 07/22/2023 CLINICAL DATA:  Shortness of breath. EXAM: CHEST - 2 VIEW COMPARISON:  Chest radiograph dated Nov 09, 2021. FINDINGS: Cardiomegaly with pulmonary vascular  congestion. Diffuse bilateral interstitial opacities. A more focal confluent opacity is noted at the left lateral mid lung. Mild left basilar atelectasis with possible trace left effusion. No pneumothorax. No acute osseous abnormality. IMPRESSION: 1. Cardiomegaly with pulmonary vascular congestion and findings suggestive of interstitial edema. 2. Mild left basilar atelectasis with possible trace left pleural effusion. 3. A more focal confluent opacity at the left lateral mid lung could reflect edema or infiltrate. Electronically Signed   By: Hart Robinsons M.D.   On: 07/22/2023 18:39    EKG: I independently viewed the EKG done and my findings are as followed: Normal sinus rhythm at a rate of 84 bpm with T wave inversion in inferior leads (similar to EKG done on 04/17/2023)  Assessment/Plan Present on Admission:  Hypertensive emergency  Atrial fibrillation (HCC)  Acute kidney injury superimposed on stage 5 chronic kidney disease, not on chronic  dialysis [facility])  Principal Problem:   Pleural effusion due to CHF (congestive heart failure) (HCC) Active Problems:   Acute kidney injury superimposed on stage 5 chronic kidney disease, not on chronic dialysis [facility])   Atrial fibrillation (HCC)   Hypertensive emergency   Morbid obesity with BMI of 50.0-59.9, adult (HCC)   Elevated brain natriuretic peptide (BNP) level   Acute respiratory failure with hypoxia (HCC)   Essential hypertension  CHF Left-sided pleural effusion Elevated BNP BNP 412 Patient with trace left pleural effusion and chest x-ray suggestive of interstitial edema She denies any prior diagnosis of congestive heart failure Continue total input/output, daily weights and fluid restriction Continue IV Lasix 40 mg twice daily Continue heart healthy/modified carb diet  Echocardiogram in the morning   Acute respiratory failure with hypoxia Patient was reported to be hypoxic in the ED and required supplemental oxygen at 2 LPM to  maintain O2 sat of 91 to 95%. Continue supplemental oxygen and plan to wean patient off this as tolerated (patient does not use supplemental oxygen at baseline)  Hypertensive emergency (resolved) Essential hypertension (uncontrolled) Continue amlodipine, hydralazine, metoprolol  AKI on CKD stage V Creatinine 4.65; baseline creatinine 3.7-4.1 Renally adjust medications, avoid nephrotoxic agents/dehydration/hypotension Nephrology will be consulted and we shall await further recommendation  Morbid obesity (BMI 51.19) Patient was counseled about the cardiovascular and metabolic risk of morbid obesity. Patient was counseled for diet control, exercise regimen and weight loss.   Chronic atrial fibrillation Continue metoprolol, Eliquis  DVT prophylaxis: Eliquis  Family Communication: None at bedside   Advance Care Planning:   Code Status: Full Code   Consults: Nephrology  Severity of Illness: The appropriate patient status for this patient is INPATIENT. Inpatient status is judged to be reasonable and necessary in order to provide the required intensity of service to ensure the patient's safety. The patient's presenting symptoms, physical exam findings, and initial radiographic and laboratory data in the context of their chronic comorbidities is felt to place them at high risk for further clinical deterioration. Furthermore, it is not anticipated that the patient will be medically stable for discharge from the hospital within 2 midnights of admission.   * I certify that at the point of admission it is my clinical judgment that the patient will require inpatient hospital care spanning beyond 2 midnights from the point of admission due to high intensity of service, high risk for further deterioration and high frequency of surveillance required.*  Author: Frankey Shown, DO 07/23/2023 4:22 AM  For on call review [email or web address].

## 2023-07-23 NOTE — Progress Notes (Signed)
Date and time results received: 07/23/23 0608  Test: HEMOGLOBIN  Critical Value: 6.9  Name of Provider Notified: Thomes Dinning

## 2023-07-24 DIAGNOSIS — I509 Heart failure, unspecified: Secondary | ICD-10-CM | POA: Diagnosis not present

## 2023-07-24 LAB — HEMOGLOBIN AND HEMATOCRIT, BLOOD
HCT: 26 % — ABNORMAL LOW (ref 36.0–46.0)
Hemoglobin: 8 g/dL — ABNORMAL LOW (ref 12.0–15.0)

## 2023-07-24 LAB — CBC
HCT: 22.9 % — ABNORMAL LOW (ref 36.0–46.0)
Hemoglobin: 6.9 g/dL — CL (ref 12.0–15.0)
MCH: 24.5 pg — ABNORMAL LOW (ref 26.0–34.0)
MCHC: 30.1 g/dL (ref 30.0–36.0)
MCV: 81.2 fL (ref 80.0–100.0)
Platelets: 319 10*3/uL (ref 150–400)
RBC: 2.82 MIL/uL — ABNORMAL LOW (ref 3.87–5.11)
RDW: 21.2 % — ABNORMAL HIGH (ref 11.5–15.5)
WBC: 6.9 10*3/uL (ref 4.0–10.5)
nRBC: 0.3 % — ABNORMAL HIGH (ref 0.0–0.2)

## 2023-07-24 LAB — BASIC METABOLIC PANEL
Anion gap: 9 (ref 5–15)
BUN: 40 mg/dL — ABNORMAL HIGH (ref 6–20)
CO2: 19 mmol/L — ABNORMAL LOW (ref 22–32)
Calcium: 8.5 mg/dL — ABNORMAL LOW (ref 8.9–10.3)
Chloride: 113 mmol/L — ABNORMAL HIGH (ref 98–111)
Creatinine, Ser: 4.8 mg/dL — ABNORMAL HIGH (ref 0.44–1.00)
GFR, Estimated: 11 mL/min — ABNORMAL LOW (ref >60)
Glucose, Bld: 101 mg/dL — ABNORMAL HIGH (ref 70–99)
Potassium: 3.9 mmol/L (ref 3.5–5.1)
Sodium: 141 mmol/L (ref 135–145)

## 2023-07-24 LAB — MAGNESIUM: Magnesium: 2 mg/dL (ref 1.7–2.4)

## 2023-07-24 LAB — PREPARE RBC (CROSSMATCH)

## 2023-07-24 MED ORDER — SODIUM CHLORIDE 0.9 % IV SOLN
100.0000 mg | INTRAVENOUS | Status: AC
  Administered 2023-07-24 – 2023-07-26 (×3): 100 mg via INTRAVENOUS
  Filled 2023-07-24: qty 5
  Filled 2023-07-24: qty 100
  Filled 2023-07-24: qty 5

## 2023-07-24 MED ORDER — SODIUM CHLORIDE 0.9% IV SOLUTION: Freq: Once | INTRAVENOUS | Status: AC

## 2023-07-24 MED ORDER — LORATADINE 10 MG PO TABS
10.0000 mg | ORAL_TABLET | Freq: Every day | ORAL | Status: DC
  Administered 2023-07-24 – 2023-07-26 (×3): 10 mg via ORAL
  Filled 2023-07-24 (×3): qty 1

## 2023-07-24 MED ORDER — SALINE SPRAY 0.65 % NA SOLN
1.0000 | NASAL | Status: DC | PRN
  Filled 2023-07-24: qty 44

## 2023-07-24 NOTE — Consult Note (Signed)
Reason for Consult:Renal failure Referring Physician:  Dr. Maurilio Lovely  Chief Complaint: Shortness of breath  Assessment/Plan: Chronic kidney disease stage IV with a baseline creatinine 4-4.3 followed by [facility].  Prior to October 2024 she was not followed by a nephrologist and also had financial issues leading to being out of her medications for over 2 weeks.  Patient actually received a ultrasound guided biopsy of her kidneys 04/29/2023 for 12gm proteinuria. 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   -Continue Lasix 80 mg every 8 hours for another 24 hours and then transition to torsemide 20 mg twice daily.  Patient was only on 20 mg daily at home.  I also counseled and instructed patient how to weigh herself each morning and adjust diuretic dosing.  -Creatinine was 3.9-4 earlier in the month as she already has very advanced CKD 4.  She has already been referred to [facility] and has an appointment for permanent access placement evaluation February 2024.  -Monitor Daily I/Os, Daily weight  -Maintain MAP>65 for optimal renal perfusion.  - Avoid nephrotoxic agents such as IV contrast, NSAIDs, and phosphate containing bowel preps (FLEETS)  HFpEF with ejection fraction of 57% on TTE 07/23/2023 currently responding to diuresis.  She is already feeling much better with markedly decreased dyspnea even ambulating to the restroom in the hospital. 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 Obesity Atrial fibrillation -rate controlled and also on Eliquis at home.   HPI: Melissa [name] is an 48 y.o. female with a history of hypertension, type 2 diabetes, obesity presenting with a 1 week history of shortness of breath especially with exertion.  She also has a history of chronic kidney disease stage IV with a  baseline creatinine of 4-4.3.  She has been seen by [facility] before by Dr. Arrie Aran, prior to that has never seen a nephrologist and had been out of her blood pressure medicines for a few weeks due to financial issues in the fall 2024.  Patient was actually admitted in October 2024 with hypertensive urgency. She is very short of breath just moving from the bedroom to the restroom; she has swelling in her legs which she does not feel is any more than at her baseline.  She denies any chest pain, pressure, fevers, myalgias, nausea or vomiting.  In the emergency department her blood pressure was very elevated at 203/99 with a BUN/creatinine of 39/4.65,  phosphorus of 5.1, white count of 7.6, hemoglobin of 7.7, afebrile.  BNP was 412.  ROS Pertinent items are noted in HPI.  Chemistry and CBC: Creatinine, Ser  Date/Time Value Ref Range Status  07/23/2023 05:36 AM 4.49 (H) 0.44 - 1.00 mg/dL Final  16/03/9603 54:09 PM 4.65 (H) 0.44 - 1.00 mg/dL Final  81/19/1478 29:56 AM 4.49 (H) 0.44 - 1.00 mg/dL Final  21/30/8657 84:69 AM 4.35 (H) 0.44 - 1.00 mg/dL Final  62/95/2841 32:44 AM 4.20 (H) 0.44 - 1.00 mg/dL Final  06/30/7251 66:44 AM 4.02 (H) 0.44 - 1.00 mg/dL Final  03/47/4259 56:38 AM 4.12 (H) 0.44 - 1.00 mg/dL Final  75/64/3329 51:88 AM 4.12 (H) 0.44 - 1.00 mg/dL Final  41/66/0630 16:01 AM 4.30 (H) 0.44 - 1.00 mg/dL Final  09/32/3557 32:20 AM 4.23 (H) 0.44 - 1.00 mg/dL Final  25/42/7062 37:62 AM 4.44 (H) 0.44 - 1.00 mg/dL Final  83/15/1761 60:73  AM 4.15 (H) 0.44 - 1.00 mg/dL Final  32/95/1884 16:60 AM 4.06 (H) 0.44 - 1.00 mg/dL Final  63/06/6008 93:23 AM 3.84 (H) 0.44 - 1.00 mg/dL Final  55/73/2202 54:27 PM 3.71 (H) 0.44 - 1.00 mg/dL Final  12/19/7626 31:51 AM 1.40 (H) 0.44 - 1.00 mg/dL Final   Recent Labs  Lab 07/22/23 1914 07/23/23 0536  NA 140 143  K 4.2 3.9  CL 112* 116*  CO2 18* 19*  GLUCOSE 149* 110*  BUN 39* 38*  CREATININE 4.65* 4.49*  CALCIUM 8.7* 8.6*  PHOS  --  5.1*    Recent Labs  Lab 07/22/23 1914 07/23/23 0536 07/23/23 0736  WBC 7.6 7.1  --   NEUTROABS 5.4  --   --   HGB 7.7* 6.9* 7.0*  HCT 25.6* 23.2* 24.3*  MCV 81.3 82.0  --   PLT 346 300  --    Liver Function Tests: Recent Labs  Lab 07/23/23 0536  AST 15  ALT 18  ALKPHOS 45  BILITOT 0.4  PROT 5.2*  ALBUMIN 1.7*   No results for input(s): "LIPASE", "AMYLASE" in the last 168 hours. No results for input(s): "AMMONIA" in the last 168 hours. Cardiac Enzymes: No results for input(s): "CKTOTAL", "CKMB", "CKMBINDEX", "TROPONINI" in the last 168 hours. Iron Studies:  Recent Labs    07/23/23 0536  IRON 43  TIBC 240*  FERRITIN 143   PT/INR: @LABRCNTIP (inr:5)  Xrays/Other Studies: ) Results for orders placed or performed during the hospital encounter of 07/22/23 (from the past 48 hours)  CBC with Differential     Status: Abnormal   Collection Time: 07/22/23  7:14 PM  Result Value Ref Range   WBC 7.6 4.0 - 10.5 K/uL   RBC 3.15 (L) 3.87 - 5.11 MIL/uL   Hemoglobin 7.7 (L) 12.0 - 15.0 g/dL   HCT 76.1 (L) 60.7 - 37.1 %   MCV 81.3 80.0 - 100.0 fL   MCH 24.4 (L) 26.0 - 34.0 pg   MCHC 30.1 30.0 - 36.0 g/dL   RDW 06.2 (H) 69.4 - 85.4 %   Platelets 346 150 - 400 K/uL   nRBC 0.3 (H) 0.0 - 0.2 %   Neutrophils Relative % 71 %   Neutro Abs 5.4 1.7 - 7.7 K/uL   Lymphocytes Relative 12 %   Lymphs Abs 0.9 0.7 - 4.0 K/uL   Monocytes Relative 10 %   Monocytes Absolute 0.7 0.1 - 1.0 K/uL   Eosinophils Relative 6 %   Eosinophils Absolute 0.4 0.0 - 0.5 K/uL   Basophils Relative 0 %   Basophils Absolute 0.0 0.0 - 0.1 K/uL   WBC Morphology MORPHOLOGY UNREMARKABLE    RBC Morphology See Note     Comment: ANISOCYTOSIS   Smear Review MORPHOLOGY UNREMARKABLE    Immature Granulocytes 1 %   Abs Immature Granulocytes 0.06 0.00 - 0.07 K/uL   Polychromasia PRESENT     Comment: Performed at [facility], [address], [place], [place] [address]  Basic metabolic panel     Status: Abnormal    Collection Time: 07/22/23  7:14 PM  Result Value Ref Range   Sodium 140 135 - 145 mmol/L   Potassium 4.2 3.5 - 5.1 mmol/L   Chloride 112 (H) 98 - 111 mmol/L   CO2 18 (L) 22 - 32 mmol/L   Glucose, Bld 149 (H) 70 - 99 mg/dL    Comment: Glucose reference range applies only to samples taken after fasting for at least 8 hours.  BUN 39 (H) 6 - 20 mg/dL   Creatinine, Ser 8.65 (H) 0.44 - 1.00 mg/dL   Calcium 8.7 (L) 8.9 - 10.3 mg/dL   GFR, Estimated 11 (L) >60 mL/min    Comment: (NOTE) Calculated using the CKD-EPI Creatinine Equation (2021)    Anion gap 10 5 - 15    Comment: Performed at [facility], [address], [place], [place] [address]  Brain natriuretic peptide     Status: Abnormal   Collection Time: 07/23/23 12:18 AM  Result Value Ref Range   B Natriuretic Peptide 412.0 (H) 0.0 - 100.0 pg/mL    Comment: Performed at [facility], [address], [place], [place] [address]  ABO/Rh     Status: None   Collection Time: 07/23/23  5:00 AM  Result Value Ref Range   ABO/RH(D)      A POS Performed at [facility], [address], [place], [place] [address]   Comprehensive metabolic panel     Status: Abnormal   Collection Time: 07/23/23  5:36 AM  Result Value Ref Range   Sodium 143 135 - 145 mmol/L   Potassium 3.9 3.5 - 5.1 mmol/L   Chloride 116 (H) 98 - 111 mmol/L   CO2 19 (L) 22 - 32 mmol/L   Glucose, Bld 110 (H) 70 - 99 mg/dL    Comment: Glucose reference range applies only to samples taken after fasting for at least 8 hours.   BUN 38 (H) 6 - 20 mg/dL   Creatinine, Ser 4.40 (H) 0.44 - 1.00 mg/dL   Calcium 8.6 (L) 8.9 - 10.3 mg/dL   Total Protein 5.2 (L) 6.5 - 8.1 g/dL   Albumin 1.7 (L) 3.5 - 5.0 g/dL   AST 15 15 - 41 U/L   ALT 18 0 - 44 U/L   Alkaline Phosphatase 45 38 - 126 U/L   Total Bilirubin 0.4 0.0 - 1.2 mg/dL   GFR, Estimated 12 (L) >60 mL/min    Comment: (NOTE) Calculated using the CKD-EPI Creatinine Equation (2021)    Anion gap 8 5 - 15    Comment: Performed at  [facility], [address], [place], [place] [address]  CBC     Status: Abnormal   Collection Time: 07/23/23  5:36 AM  Result Value Ref Range   WBC 7.1 4.0 - 10.5 K/uL   RBC 2.83 (L) 3.87 - 5.11 MIL/uL   Hemoglobin 6.9 (LL) 12.0 - 15.0 g/dL    Comment: REPEATED TO VERIFY THIS CRITICAL RESULT HAS VERIFIED AND BEEN CALLED TO M MARSH BY SHANA DALTON ON 01 25 2025 AT 0607, AND HAS BEEN READ BACK.     HCT 23.2 (L) 36.0 - 46.0 %   MCV 82.0 80.0 - 100.0 fL   MCH 24.4 (L) 26.0 - 34.0 pg   MCHC 29.7 (L) 30.0 - 36.0 g/dL   RDW 53.6 (H) 64.4 - 03.4 %   Platelets 300 150 - 400 K/uL   nRBC 0.7 (H) 0.0 - 0.2 %    Comment: Performed at [facility], [address], [place], [place] [address]  Magnesium     Status: None   Collection Time: 07/23/23  5:36 AM  Result Value Ref Range   Magnesium 2.0 1.7 - 2.4 mg/dL    Comment: Performed at [facility], [address], [place], [place] [address]  Phosphorus     Status: Abnormal   Collection Time: 07/23/23  5:36 AM  Result Value Ref Range   Phosphorus 5.1 (H)  2.5 - 4.6 mg/dL    Comment: Performed at [facility], [address], [place], [place] [address]  Vitamin B12     Status: None   Collection Time: 07/23/23  5:36 AM  Result Value Ref Range   Vitamin B-12 533 180 - 914 pg/mL    Comment: (NOTE) This assay is not validated for testing neonatal or myeloproliferative syndrome specimens for Vitamin B12 levels. Performed at [facility], [address], [place], [place] [address]   Folate     Status: None   Collection Time: 07/23/23  5:36 AM  Result Value Ref Range   Folate 7.5 >5.9 ng/mL    Comment: Performed at [facility], [address], [place], [place] [address]  Iron and TIBC     Status: Abnormal   Collection Time: 07/23/23  5:36 AM  Result Value Ref Range   Iron 43 28 - 170 ug/dL   TIBC 914 (L) 782 - 956 ug/dL   Saturation Ratios 18 10.4 - 31.8 %   UIBC 197 ug/dL    Comment: Performed at [facility], [address], [place],  [place] [address]  Ferritin     Status: None   Collection Time: 07/23/23  5:36 AM  Result Value Ref Range   Ferritin 143 11 - 307 ng/mL    Comment: Performed at [facility], [address], [place], [place] [address]  Reticulocytes     Status: Abnormal   Collection Time: 07/23/23  5:36 AM  Result Value Ref Range   Retic Ct Pct 4.6 (H) 0.4 - 3.1 %   RBC. 2.90 (L) 3.87 - 5.11 MIL/uL   Retic Count, Absolute 133.7 19.0 - 186.0 K/uL   Immature Retic Fract 37.4 (H) 2.3 - 15.9 %    Comment: Performed at [facility], [address], [place], [place] [address]  Hemoglobin and hematocrit, blood     Status: Abnormal   Collection Time: 07/23/23  7:36 AM  Result Value Ref Range   Hemoglobin 7.0 (L) 12.0 - 15.0 g/dL   HCT 52.8 (L) 41.3 - 24.4 %    Comment: Performed at [facility], [address] [place], [place] [address]   ECHOCARDIOGRAM COMPLETE Result Date: 07/23/2023    ECHOCARDIOGRAM REPORT   Patient Name:   Melissa [name] Date of Exam: 07/23/2023 Medical Rec #:  [id number]      Height:       65.2 in Accession #:    [id number]     Weight:       308.9 lb Date of Birth:  03/01/1976      BSA:          2.386 m Patient Age:    47 years       BP:           133/81 mmHg Patient Gender: F              HR:           87 bpm. Exam Location:  Inpatient Procedure: 2D Echo, 3D Echo, Cardiac Doppler, Color Doppler, Strain Analysis and            Intracardiac Opacification Agent Indications:    CHF-Acute Systolic  History:        Patient has no prior history of Echocardiogram examinations.                 Risk Factors:Hypertension and Former Smoker.  Sonographer:    Karma Ganja Referring Phys: [id number] OLADAPO ADEFESO  Sonographer Comments: Technically challenging study due to limited acoustic windows and patient is obese. Global longitudinal strain was attempted. IMPRESSIONS  1. Left ventricular ejection fraction, by estimation, is 55 to 60%. Left ventricular ejection fraction by 3D volume is 57 %. The left ventricle has normal  function. The left ventricle has no regional wall motion abnormalities. There is severe concentric  left ventricular hypertrophy. Left ventricular diastolic parameters are consistent with Grade I diastolic dysfunction (impaired relaxation).  2. Right ventricular systolic function is normal. The right ventricular size is mildly enlarged. There is normal pulmonary artery systolic pressure.  3. Left atrial size was mild to moderately dilated.  4. The mitral valve is normal in structure. Mild mitral valve regurgitation. No evidence of mitral stenosis.  5. The aortic valve is normal in structure. Aortic valve regurgitation is not visualized. No aortic stenosis is present.  6. The inferior vena cava is normal in size with greater than 50% respiratory variability, suggesting right atrial pressure of 3 mmHg. FINDINGS  Left Ventricle: Left ventricular ejection fraction, by estimation, is 55 to 60%. Left ventricular ejection fraction by 3D volume is 57 %. The left ventricle has normal function. The left ventricle has no regional wall motion abnormalities. Definity contrast agent was given IV to delineate the left ventricular endocardial borders. The global longitudinal strain is normal despite suboptimal segment tracking. The left ventricular internal cavity size was normal in size. There is severe concentric left  ventricular hypertrophy. Left ventricular diastolic parameters are consistent with Grade I diastolic dysfunction (impaired relaxation). Right Ventricle: The right ventricular size is mildly enlarged. No increase in right ventricular wall thickness. Right ventricular systolic function is normal. There is normal pulmonary artery systolic pressure. The tricuspid regurgitant velocity is 2.44  m/s, and with an assumed right atrial pressure of 3 mmHg, the estimated right ventricular systolic pressure is 26.8 mmHg. Left Atrium: Left atrial size was mild to moderately dilated. Right Atrium: Right atrial size was normal in  size. Pericardium: There is no evidence of pericardial effusion. Presence of epicardial fat layer. Mitral Valve: The mitral valve is normal in structure. Mild mitral annular calcification. Mild mitral valve regurgitation. No evidence of mitral valve stenosis. Tricuspid Valve: The tricuspid valve is normal in structure. Tricuspid valve regurgitation is trivial. No evidence of tricuspid stenosis. Aortic Valve: The aortic valve is normal in structure. Aortic valve regurgitation is not visualized. No aortic stenosis is present. Aortic valve mean gradient measures 7.0 mmHg. Aortic valve peak gradient measures 14.0 mmHg. Aortic valve area, by VTI measures 1.81 cm. Pulmonic Valve: The pulmonic valve was normal in structure. Pulmonic valve regurgitation is not visualized. No evidence of pulmonic stenosis. Aorta: The aortic root is normal in size and structure. Venous: The inferior vena cava is normal in size with greater than 50% respiratory variability, suggesting right atrial pressure of 3 mmHg. IAS/Shunts: No atrial level shunt detected by color flow Doppler.  LEFT VENTRICLE PLAX 2D LVIDd:         4.20 cm         Diastology LVIDs:         2.30 cm         LV e' medial:    12.90 cm/s LV PW:         1.50 cm         LV E/e' medial:  8.4 LV IVS:        1.50 cm         LV e' lateral:  9.79 cm/s LVOT diam:     2.00 cm         LV E/e' lateral: 11.0 LV SV:         71 LV SV Index:   30 LVOT Area:     3.14 cm        3D Volume EF                                LV 3D EF:    Left                                             ventricul                                             ar                                             ejection                                             fraction                                             by 3D                                             volume is                                             57 %.                                 3D Volume EF:                                3D EF:        57 %                                 LV EDV:       192 ml                                LV ESV:       83 ml  LV SV:        109 ml RIGHT VENTRICLE            IVC RV Basal diam:  4.50 cm    IVC diam: 1.90 cm RV S prime:     7.60 cm/s TAPSE (M-mode): 3.6 cm LEFT ATRIUM              Index        RIGHT ATRIUM           Index LA diam:        5.10 cm  2.14 cm/m   RA Area:     16.70 cm LA Vol (A2C):   110.0 ml 46.11 ml/m  RA Volume:   46.90 ml  19.66 ml/m LA Vol (A4C):   87.2 ml  36.55 ml/m LA Biplane Vol: 98.2 ml  41.16 ml/m  AORTIC VALVE AV Area (Vmax):    1.78 cm AV Area (Vmean):   1.88 cm AV Area (VTI):     1.81 cm AV Vmax:           187.00 cm/s AV Vmean:          125.000 cm/s AV VTI:            0.392 m AV Peak Grad:      14.0 mmHg AV Mean Grad:      7.0 mmHg LVOT Vmax:         106.00 cm/s LVOT Vmean:        74.700 cm/s LVOT VTI:          0.226 m LVOT/AV VTI ratio: 0.58  AORTA Ao Root diam: 3.00 cm MITRAL VALVE                TRICUSPID VALVE MV Area (PHT): 3.60 cm     TR Peak grad:   23.8 mmHg MV Decel Time: 211 msec     TR Vmax:        244.00 cm/s MV E velocity: 108.00 cm/s MV A velocity: 113.00 cm/s  SHUNTS MV E/A ratio:  0.96         Systemic VTI:  0.23 m                             Systemic Diam: 2.00 cm Kardie Tobb DO Electronically signed by Thomasene Ripple DO Signature Date/Time: 07/23/2023/12:49:50 PM    Final    DG Chest 2 View Result Date: 07/22/2023 CLINICAL DATA:  Shortness of breath. EXAM: CHEST - 2 VIEW COMPARISON:  Chest radiograph dated Nov 09, 2021. FINDINGS: Cardiomegaly with pulmonary vascular congestion. Diffuse bilateral interstitial opacities. A more focal confluent opacity is noted at the left lateral mid lung. Mild left basilar atelectasis with possible trace left effusion. No pneumothorax. No acute osseous abnormality. IMPRESSION: 1. Cardiomegaly with pulmonary vascular congestion and findings suggestive of interstitial edema. 2. Mild left basilar atelectasis with  possible trace left pleural effusion. 3. A more focal confluent opacity at the left lateral mid lung could reflect edema or infiltrate. Electronically Signed   By: Hart Robinsons M.D.   On: 07/22/2023 18:39    PMH:   Past Medical History:  Diagnosis Date   Anemia    Anxiety    Chronic kidney disease    Complication of anesthesia    Diabetes mellitus without complication (HCC)    Dysrhythmia    GERD (gastroesophageal reflux disease)    History of hiatal hernia  Hypertension    PONV (postoperative nausea and vomiting)     PSH:   Past Surgical History:  Procedure Laterality Date   BIOPSY  02/02/2022   Procedure: BIOPSY;  Surgeon: Marguerita Merles, Reuel Boom, MD;  Location: [facility];  Service: Gastroenterology;;   ESOPHAGOGASTRODUODENOSCOPY (EGD) WITH PROPOFOL N/A 02/02/2022   Procedure: ESOPHAGOGASTRODUODENOSCOPY (EGD) WITH PROPOFOL;  Surgeon: Dolores Frame, MD;  Location: [facility];  Service: Gastroenterology;  Laterality: N/A;  240   NO PAST SURGERIES      Allergies: No Known Allergies  Medications:   Prior to Admission medications   Medication Sig Start Date End Date Taking? Authorizing Provider  amLODipine (NORVASC) 10 MG tablet Take 10 mg by mouth daily. 12/16/21   [provider]  cetirizine (ZYRTEC) 10 MG tablet Take 10 mg by mouth daily. 09/16/20   [provider]  citalopram (CELEXA) 20 MG tablet Take 20 mg by mouth daily. 05/07/19   [provider]  ELIQUIS 5 MG TABS tablet Take 1 tablet (5 mg total) by mouth 2 (two) times daily. Resume from 04/30/2023 04/30/23   Glade Lloyd, MD  ferrous sulfate 325 (65 FE) MG tablet Take 325 mg by mouth daily. Patient not taking: Reported on 04/19/2023    [provider]  hydrALAZINE (APRESOLINE) 100 MG tablet Take 1 tablet (100 mg total) by mouth every 8 (eight) hours. 04/29/23   Glade Lloyd, MD  JENCYCLA 0.35 MG tablet Take 1 tablet by mouth daily. 11/25/21   [provider]  metoprolol succinate (TOPROL-XL) 100 MG 24 hr tablet Take 1 tablet (100 mg total) by mouth daily. Take with or immediately following a meal. 04/30/23   Glade Lloyd, MD  pantoprazole (PROTONIX) 40 MG tablet Take 40 mg by mouth daily.    [provider]  pioglitazone (ACTOS) 15 MG tablet Take 15 mg by mouth daily.    [provider]    Discontinued Meds:   Medications Discontinued During This Encounter  Medication Reason   heparin injection 5,000 Units    furosemide (LASIX) injection 40 mg    apixaban (ELIQUIS) tablet 5 mg     Social History:  reports that she has quit smoking. Her smoking use included cigarettes. She has been exposed to tobacco smoke. She has quit using smokeless tobacco. She reports current alcohol use. She reports that she does not use drugs.  Family History:  History reviewed. No pertinent family history.  Blood pressure (!) 146/79, pulse 81, temperature 97.8 F (36.6 C), temperature source Oral, resp. rate 16, height 5' 5.25" (1.657 m), weight (!) 140.1 kg, last menstrual period 06/21/2023, SpO2 92%, unknown if currently breastfeeding. Ostomy GEN: NAD, A&Ox3, NCAT HEENT: No conjunctival pallor, EOMI NECK: Supple, no thyromegaly LUNGS: CTA B/L no rales, rhonchi or wheezing CV: irreg irreg ABD: SNDNT +BS  EXT: tr-1+ lower extremity edema GU: no foley       Ethelene Hal, MD 07/24/2023, 4:52 AM

## 2023-07-24 NOTE — Plan of Care (Signed)
  Problem: Education: Goal: Knowledge of General Education information will improve Description: Including pain rating scale, medication(s)/side effects and non-pharmacologic comfort measures Outcome: Progressing   Problem: Health Behavior/Discharge Planning: Goal: Ability to manage health-related needs will improve Outcome: Progressing   Problem: Clinical Measurements: Goal: Will remain free from infection Outcome: Progressing   Problem: Clinical Measurements: Goal: Diagnostic test results will improve Outcome: Not Progressing

## 2023-07-24 NOTE — TOC Progression Note (Signed)
Transition of Care [facility]) - Progression Note    Patient Details  Name: Melissa [name] MRN: [id number] Date of Birth: Jun 11, 1976  [facility] CM/SW Contact  Melissa Gravel, LCSW Phone Number: 07/24/2023, 3:31 PM  Clinical Narrative:    Pt was yellow, now Orange, high risk for readmission. CSW completed assessment with pt at bedside. Pt is  independent in completing her ADLs. Pt. Lives in a single family home with her 72 year old son.  Her son has been asked to help around the home.  Pt drives herself to appointments.   Pt does not have DME in the home nor does she feel she needs any.  CSW discussed  monitoring weight gain, pt states MD covered this with her. TOC to follow.    Expected Discharge Plan: Home/Self Care Barriers to Discharge: Continued Medical Work up  Expected Discharge Plan and Services In-house Referral: Clinical Social Work     Living arrangements for the past 2 months: Single Family Home                                       Social Determinants of Health (SDOH) Interventions SDOH Screenings   Food Insecurity: No Food Insecurity (07/23/2023)  Housing: Low Risk  (07/23/2023)  Transportation Needs: No Transportation Needs (07/23/2023)  Utilities: Not At Risk (07/23/2023)  Depression (PHQ2-9): Low Risk  (07/22/2023)  Financial Resource Strain: Low Risk  (11/10/2021)   [facility] from [facility], [facility]  Tobacco Use: Medium Risk (07/22/2023)  Health Literacy: Low Risk  (10/19/2021)   [facility] from [facility], [facility]    Readmission Risk Interventions    07/24/2023    3:28 PM 07/24/2023   12:38 PM  Readmission Risk Prevention Plan  Transportation Screening Complete Complete  PCP or Specialist Appt within 3-5 Days  Complete  HRI or Home Care Consult  Complete  Social Work Consult for Recovery Care Planning/Counseling  Complete  Palliative Care Screening  Not Applicable  Medication Review Oceanographer)  Complete

## 2023-07-24 NOTE — Progress Notes (Signed)
   07/24/23 0700  Provider Notification  Provider Name/Title Dr. Maurilio Lovely  Date Provider Notified 07/24/23  Time Provider Notified 0730  Notification Reason Critical Result  Test performed and critical result hgb 6.9  Date Critical Result Received 07/24/23  Time Critical Result Received 0715  Provider response See new orders

## 2023-07-24 NOTE — Progress Notes (Signed)
PROGRESS NOTE    Melissa [name]  GNF:[id number] DOB: 25-Jul-1975 DOA: 07/22/2023 PCP: Shelby Dubin, FNP   Brief Narrative:    Melissa [name] is a 48 y.o. female with medical history significant of hypertension, atrial fibrillation on Eliquis, type 2 diabetes mellitus, CKD 5, obesity who presents to the emergency department due to 1 week onset of shortness of breath.  She has been admitted with volume overload with left-sided pleural effusion and suspicion of new CHF.  She also has AKI on CKD stage IV and has been started on IV Lasix for diuresis.  Her hemoglobin is also demonstrating a downward trend and her home Eliquis has been held.  Assessment & Plan:   Principal Problem:   Pleural effusion due to CHF (congestive heart failure) (HCC) Active Problems:   Acute kidney injury superimposed on stage 5 chronic kidney disease, not on chronic dialysis (HCC)   Atrial fibrillation, chronic (HCC)   Hypertensive emergency   Morbid obesity with BMI of 50.0-59.9, adult (HCC)   Elevated brain natriuretic peptide (BNP) level   Acute respiratory failure with hypoxia (HCC)   Essential hypertension  Assessment and Plan:   Volume overload in the setting of worsening CKD with HFpEF Left-sided pleural effusion 2D echocardiogram with preserved LVEF and grade 1 diastolic dysfunction with concentric LVH Patient with trace left pleural effusion and chest x-ray suggestive of interstitial edema She denies any prior diagnosis of congestive heart failure Continue total input/output, daily weights and fluid restriction Continue IV Lasix 80 mg every 8 hours per nephrology recommendations Continue heart healthy/modified carb diet       Echocardiogram ordered and pending   Acute respiratory failure with hypoxia Patient was reported to be hypoxic in the ED and required supplemental oxygen at 2 LPM to maintain O2 sat of 91 to 95%. Continue supplemental oxygen and plan to wean patient off this as tolerated  (patient does not use supplemental oxygen at baseline)  Worsening anemia Likely secondary to CKD, Aranesp ordered per nephrology Hold Eliquis Anemia panel WNL, has received iron infusions recently Check stool occult Continue iron infusions 1 unit PRBC transfusion ordered 1/26   Hypertensive emergency (resolved) Essential hypertension (uncontrolled) Continue amlodipine, hydralazine, metoprolol   AKI on CKD stage V Creatinine 4.65; baseline creatinine 3.7-4.1 Renally adjust medications, avoid nephrotoxic agents/dehydration/hypotension Nephrology will be consulted and we shall await further recommendation   Morbid obesity (BMI 51.19) Patient was counseled about the cardiovascular and metabolic risk of morbid obesity. Patient was counseled for diet control, exercise regimen and weight loss.    Chronic atrial fibrillation Continue metoprolol Eliquis held due to concerns for worsening anemia   DVT prophylaxis: SCDs Code Status: Full Family Communication: None at bedside Disposition Plan:  Status is: Inpatient Remains inpatient appropriate because: Need for IV medications.  Consultants:  Nephrology  Procedures:  None  Antimicrobials:  None   Subjective: Patient seen and evaluated today with no new acute complaints or concerns. No acute concerns or events noted overnight.  She has had better urine output and states that her shortness of breath is improving.  Objective: Vitals:   07/23/23 1957 07/23/23 2244 07/24/23 0440 07/24/23 0909  BP: 134/84 (!) 158/96 (!) 146/79 135/80  Pulse: 73 84 81 87  Resp: 16  16   Temp: 98.2 F (36.8 C)  97.8 F (36.6 C)   TempSrc: Oral  Oral   SpO2: 92%  92%   Weight:   (!) 142.3 kg   Height:  Intake/Output Summary (Last 24 hours) at 07/24/2023 1028 Last data filed at 07/24/2023 8413 Gross per 24 hour  Intake 720 ml  Output 2900 ml  Net -2180 ml   Filed Weights   07/22/23 1810 07/23/23 0500 07/24/23 0440  Weight: (!)  140.6 kg (!) 140.1 kg (!) 142.3 kg    Examination:  General exam: Appears calm and comfortable  Respiratory system: Clear to auscultation. Respiratory effort normal.  Nasal cannula oxygen Cardiovascular system: S1 & S2 heard, RRR.  Gastrointestinal system: Abdomen is soft Central nervous system: Alert and awake Extremities: No edema Skin: No significant lesions noted Psychiatry: Flat affect.    Data Reviewed: I have personally reviewed following labs and imaging studies  CBC: Recent Labs  Lab 07/22/23 1914 07/23/23 0536 07/23/23 0736 07/24/23 0504  WBC 7.6 7.1  --  6.9  NEUTROABS 5.4  --   --   --   HGB 7.7* 6.9* 7.0* 6.9*  HCT 25.6* 23.2* 24.3* 22.9*  MCV 81.3 82.0  --  81.2  PLT 346 300  --  319   Basic Metabolic Panel: Recent Labs  Lab 07/22/23 1914 07/23/23 0536 07/24/23 0504  NA 140 143 141  K 4.2 3.9 3.9  CL 112* 116* 113*  CO2 18* 19* 19*  GLUCOSE 149* 110* 101*  BUN 39* 38* 40*  CREATININE 4.65* 4.49* 4.80*  CALCIUM 8.7* 8.6* 8.5*  MG  --  2.0 2.0  PHOS  --  5.1*  --    GFR: Estimated Creatinine Clearance: 20.9 mL/min (A) (by C-G formula based on SCr of 4.8 mg/dL (H)). Liver Function Tests: Recent Labs  Lab 07/23/23 0536  AST 15  ALT 18  ALKPHOS 45  BILITOT 0.4  PROT 5.2*  ALBUMIN 1.7*   No results for input(s): "LIPASE", "AMYLASE" in the last 168 hours. No results for input(s): "AMMONIA" in the last 168 hours. Coagulation Profile: No results for input(s): "INR", "PROTIME" in the last 168 hours. Cardiac Enzymes: No results for input(s): "CKTOTAL", "CKMB", "CKMBINDEX", "TROPONINI" in the last 168 hours. BNP (last 3 results) No results for input(s): "PROBNP" in the last 8760 hours. HbA1C: No results for input(s): "HGBA1C" in the last 72 hours. CBG: No results for input(s): "GLUCAP" in the last 168 hours. Lipid Profile: No results for input(s): "CHOL", "HDL", "LDLCALC", "TRIG", "CHOLHDL", "LDLDIRECT" in the last 72 hours. Thyroid  Function Tests: No results for input(s): "TSH", "T4TOTAL", "FREET4", "T3FREE", "THYROIDAB" in the last 72 hours. Anemia Panel: Recent Labs    07/23/23 0536  VITAMINB12 533  FOLATE 7.5  FERRITIN 143  TIBC 240*  IRON 43  RETICCTPCT 4.6*   Sepsis Labs: No results for input(s): "PROCALCITON", "LATICACIDVEN" in the last 168 hours.  No results found for this or any previous visit (from the past 240 hours).       Radiology Studies: ECHOCARDIOGRAM COMPLETE Result Date: 07/23/2023    ECHOCARDIOGRAM REPORT   Patient Name:   Melissa [name] Date of Exam: 07/23/2023 Medical Rec #:  [id number]      Height:       65.2 in Accession #:    [id number]     Weight:       308.9 lb Date of Birth:  1976-03-03      BSA:          2.386 m Patient Age:    47 years       BP:           133/81 mmHg Patient Gender: F  HR:           87 bpm. Exam Location:  Inpatient Procedure: 2D Echo, 3D Echo, Cardiac Doppler, Color Doppler, Strain Analysis and            Intracardiac Opacification Agent Indications:    CHF-Acute Systolic  History:        Patient has no prior history of Echocardiogram examinations.                 Risk Factors:Hypertension and Former Smoker.  Sonographer:    Karma Ganja Referring Phys: [id number] OLADAPO ADEFESO  Sonographer Comments: Technically challenging study due to limited acoustic windows and patient is obese. Global longitudinal strain was attempted. IMPRESSIONS  1. Left ventricular ejection fraction, by estimation, is 55 to 60%. Left ventricular ejection fraction by 3D volume is 57 %. The left ventricle has normal function. The left ventricle has no regional wall motion abnormalities. There is severe concentric  left ventricular hypertrophy. Left ventricular diastolic parameters are consistent with Grade I diastolic dysfunction (impaired relaxation).  2. Right ventricular systolic function is normal. The right ventricular size is mildly enlarged. There is normal pulmonary artery systolic  pressure.  3. Left atrial size was mild to moderately dilated.  4. The mitral valve is normal in structure. Mild mitral valve regurgitation. No evidence of mitral stenosis.  5. The aortic valve is normal in structure. Aortic valve regurgitation is not visualized. No aortic stenosis is present.  6. The inferior vena cava is normal in size with greater than 50% respiratory variability, suggesting right atrial pressure of 3 mmHg. FINDINGS  Left Ventricle: Left ventricular ejection fraction, by estimation, is 55 to 60%. Left ventricular ejection fraction by 3D volume is 57 %. The left ventricle has normal function. The left ventricle has no regional wall motion abnormalities. Definity contrast agent was given IV to delineate the left ventricular endocardial borders. The global longitudinal strain is normal despite suboptimal segment tracking. The left ventricular internal cavity size was normal in size. There is severe concentric left  ventricular hypertrophy. Left ventricular diastolic parameters are consistent with Grade I diastolic dysfunction (impaired relaxation). Right Ventricle: The right ventricular size is mildly enlarged. No increase in right ventricular wall thickness. Right ventricular systolic function is normal. There is normal pulmonary artery systolic pressure. The tricuspid regurgitant velocity is 2.44  m/s, and with an assumed right atrial pressure of 3 mmHg, the estimated right ventricular systolic pressure is 26.8 mmHg. Left Atrium: Left atrial size was mild to moderately dilated. Right Atrium: Right atrial size was normal in size. Pericardium: There is no evidence of pericardial effusion. Presence of epicardial fat layer. Mitral Valve: The mitral valve is normal in structure. Mild mitral annular calcification. Mild mitral valve regurgitation. No evidence of mitral valve stenosis. Tricuspid Valve: The tricuspid valve is normal in structure. Tricuspid valve regurgitation is trivial. No evidence of  tricuspid stenosis. Aortic Valve: The aortic valve is normal in structure. Aortic valve regurgitation is not visualized. No aortic stenosis is present. Aortic valve mean gradient measures 7.0 mmHg. Aortic valve peak gradient measures 14.0 mmHg. Aortic valve area, by VTI measures 1.81 cm. Pulmonic Valve: The pulmonic valve was normal in structure. Pulmonic valve regurgitation is not visualized. No evidence of pulmonic stenosis. Aorta: The aortic root is normal in size and structure. Venous: The inferior vena cava is normal in size with greater than 50% respiratory variability, suggesting right atrial pressure of 3 mmHg. IAS/Shunts: No atrial level shunt detected by color flow Doppler.  LEFT VENTRICLE PLAX 2D LVIDd:         4.20 cm         Diastology LVIDs:         2.30 cm         LV e' medial:    12.90 cm/s LV PW:         1.50 cm         LV E/e' medial:  8.4 LV IVS:        1.50 cm         LV e' lateral:   9.79 cm/s LVOT diam:     2.00 cm         LV E/e' lateral: 11.0 LV SV:         71 LV SV Index:   30 LVOT Area:     3.14 cm        3D Volume EF                                LV 3D EF:    Left                                             ventricul                                             ar                                             ejection                                             fraction                                             by 3D                                             volume is                                             57 %.                                 3D Volume EF:                                3D EF:        57 %  LV EDV:       192 ml                                LV ESV:       83 ml                                LV SV:        109 ml RIGHT VENTRICLE            IVC RV Basal diam:  4.50 cm    IVC diam: 1.90 cm RV S prime:     7.60 cm/s TAPSE (M-mode): 3.6 cm LEFT ATRIUM              Index        RIGHT ATRIUM           Index LA diam:        5.10 cm  2.14  cm/m   RA Area:     16.70 cm LA Vol (A2C):   110.0 ml 46.11 ml/m  RA Volume:   46.90 ml  19.66 ml/m LA Vol (A4C):   87.2 ml  36.55 ml/m LA Biplane Vol: 98.2 ml  41.16 ml/m  AORTIC VALVE AV Area (Vmax):    1.78 cm AV Area (Vmean):   1.88 cm AV Area (VTI):     1.81 cm AV Vmax:           187.00 cm/s AV Vmean:          125.000 cm/s AV VTI:            0.392 m AV Peak Grad:      14.0 mmHg AV Mean Grad:      7.0 mmHg LVOT Vmax:         106.00 cm/s LVOT Vmean:        74.700 cm/s LVOT VTI:          0.226 m LVOT/AV VTI ratio: 0.58  AORTA Ao Root diam: 3.00 cm MITRAL VALVE                TRICUSPID VALVE MV Area (PHT): 3.60 cm     TR Peak grad:   23.8 mmHg MV Decel Time: 211 msec     TR Vmax:        244.00 cm/s MV E velocity: 108.00 cm/s MV A velocity: 113.00 cm/s  SHUNTS MV E/A ratio:  0.96         Systemic VTI:  0.23 m                             Systemic Diam: 2.00 cm Kardie Tobb DO Electronically signed by Thomasene Ripple DO Signature Date/Time: 07/23/2023/12:49:50 PM    Final    DG Chest 2 View Result Date: 07/22/2023 CLINICAL DATA:  Shortness of breath. EXAM: CHEST - 2 VIEW COMPARISON:  Chest radiograph dated Nov 09, 2021. FINDINGS: Cardiomegaly with pulmonary vascular congestion. Diffuse bilateral interstitial opacities. A more focal confluent opacity is noted at the left lateral mid lung. Mild left basilar atelectasis with possible trace left effusion. No pneumothorax. No acute osseous abnormality. IMPRESSION: 1. Cardiomegaly with pulmonary vascular congestion and findings suggestive of interstitial edema. 2. Mild left basilar atelectasis with possible trace left pleural effusion. 3. A more focal confluent opacity at the  left lateral mid lung could reflect edema or infiltrate. Electronically Signed   By: Hart Robinsons M.D.   On: 07/22/2023 18:39        Scheduled Meds:  sodium chloride   Intravenous Once   amLODipine  10 mg Oral Daily   darbepoetin (ARANESP) injection - NON-DIALYSIS  100 mcg  Subcutaneous Q Sat-1800   furosemide  80 mg Intravenous Q8H   hydrALAZINE  100 mg Oral Q8H   loratadine  10 mg Oral Daily   metoprolol succinate  100 mg Oral Daily   pantoprazole  40 mg Oral Daily   polyethylene glycol  17 g Oral Daily     LOS: 1 day    Time spent: 55 minutes    Teegan Guinther Hoover Brunette, DO [facility]  If 7PM-7AM, please contact night-coverage [email or web address] 07/24/2023, 10:28 AM

## 2023-07-24 NOTE — Progress Notes (Addendum)
Pt is c/o very dry itchy skin to entire body. Rash present to chest. She showered and sween 24 applied. Blood transfusion completed around 1700 today. This RN informed overnight coverage, Dr Thomes Dinning. Due to amount of itching. Kellogg RN

## 2023-07-24 NOTE — Plan of Care (Signed)
Problem: Health Behavior/Discharge Planning: Goal: Ability to manage health-related needs will improve Outcome: Progressing

## 2023-07-25 DIAGNOSIS — I509 Heart failure, unspecified: Secondary | ICD-10-CM | POA: Diagnosis not present

## 2023-07-25 LAB — BASIC METABOLIC PANEL
Anion gap: 7 (ref 5–15)
BUN: 42 mg/dL — ABNORMAL HIGH (ref 6–20)
CO2: 20 mmol/L — ABNORMAL LOW (ref 22–32)
Calcium: 8.4 mg/dL — ABNORMAL LOW (ref 8.9–10.3)
Chloride: 112 mmol/L — ABNORMAL HIGH (ref 98–111)
Creatinine, Ser: 4.84 mg/dL — ABNORMAL HIGH (ref 0.44–1.00)
GFR, Estimated: 11 mL/min — ABNORMAL LOW (ref >60)
Glucose, Bld: 92 mg/dL (ref 70–99)
Potassium: 3.8 mmol/L (ref 3.5–5.1)
Sodium: 139 mmol/L (ref 135–145)

## 2023-07-25 LAB — CBC
HCT: 25.6 % — ABNORMAL LOW (ref 36.0–46.0)
Hemoglobin: 7.7 g/dL — ABNORMAL LOW (ref 12.0–15.0)
MCH: 24.4 pg — ABNORMAL LOW (ref 26.0–34.0)
MCHC: 30.1 g/dL (ref 30.0–36.0)
MCV: 81.3 fL (ref 80.0–100.0)
Platelets: 294 10*3/uL (ref 150–400)
RBC: 3.15 MIL/uL — ABNORMAL LOW (ref 3.87–5.11)
RDW: 20.1 % — ABNORMAL HIGH (ref 11.5–15.5)
WBC: 7.4 10*3/uL (ref 4.0–10.5)
nRBC: 0.3 % — ABNORMAL HIGH (ref 0.0–0.2)

## 2023-07-25 LAB — BPAM RBC
Blood Product Expiration Date: 202502042359
ISSUE DATE / TIME: 202501261122
Unit Type and Rh: 6200

## 2023-07-25 LAB — TYPE AND SCREEN
ABO/RH(D): A POS
Antibody Screen: NEGATIVE
Unit division: 0

## 2023-07-25 LAB — MAGNESIUM: Magnesium: 1.9 mg/dL (ref 1.7–2.4)

## 2023-07-25 MED ORDER — APIXABAN 5 MG PO TABS
5.0000 mg | ORAL_TABLET | Freq: Two times a day (BID) | ORAL | Status: DC
  Administered 2023-07-25 – 2023-07-26 (×3): 5 mg via ORAL
  Filled 2023-07-25 (×3): qty 1

## 2023-07-25 MED ORDER — TORSEMIDE 20 MG PO TABS
40.0000 mg | ORAL_TABLET | Freq: Two times a day (BID) | ORAL | Status: DC
  Administered 2023-07-25 – 2023-07-26 (×2): 40 mg via ORAL
  Filled 2023-07-25 (×2): qty 2

## 2023-07-25 NOTE — Progress Notes (Signed)
Pt rested well last night. She was given the 80mg  Lasix at bedtime and only needed to micturate once overnight. Pitting edema noted to lower legs and ankles. Very dry, flaky, itchy skin improved after Sween 24 application. She is able to ambulate to restroom independently with steady gait.  She voices concern about possible need for dialysis.  No acute events overnight. Kellogg RN

## 2023-07-25 NOTE — Progress Notes (Signed)
PROGRESS NOTE    NERINE PULSE  ZOX:[id number] DOB: 1975/08/18 DOA: 07/22/2023 PCP: Shelby Dubin, FNP   Brief Narrative:    Melissa [name] is a 48 y.o. female with medical history significant of hypertension, atrial fibrillation on Eliquis, type 2 diabetes mellitus, CKD 5, obesity who presents to the emergency department due to 1 week onset of shortness of breath.  She has been admitted with volume overload with left-sided pleural effusion and suspicion of new CHF.  She also has AKI on CKD stage IV and has been started on IV Lasix for diuresis.  Her hemoglobin was demonstrating a downward trend and she was started on IV iron infusions as well as ESA and received 1 unit PRBC transfusion 1/26.  She has now been transitioned to torsemide and home Eliquis has been resumed.  Assessment & Plan:   Principal Problem:   Pleural effusion due to CHF (congestive heart failure) (HCC) Active Problems:   Acute kidney injury superimposed on stage 5 chronic kidney disease, not on chronic dialysis (HCC)   Atrial fibrillation, chronic (HCC)   Hypertensive emergency   Morbid obesity with BMI of 50.0-59.9, adult (HCC)   Elevated brain natriuretic peptide (BNP) level   Acute respiratory failure with hypoxia (HCC)   Essential hypertension  Assessment and Plan:   Volume overload in the setting of worsening CKD with HFpEF Left-sided pleural effusion 2D echocardiogram with preserved LVEF and grade 1 diastolic dysfunction with concentric LVH Patient with trace left pleural effusion and chest x-ray suggestive of interstitial edema She denies any prior diagnosis of congestive heart failure Continue total input/output, daily weights and fluid restriction Continue now on oral torsemide 40 mg twice daily per nephrology recommendations and monitor output Continue heart healthy/modified carb diet       Echocardiogram with HFpEF noted and preserved LVEF   Acute respiratory failure with hypoxia Patient was  reported to be hypoxic in the ED and required supplemental oxygen at 2 LPM to maintain O2 sat of 91 to 95%. Continue supplemental oxygen and plan to wean patient off this as tolerated (patient does not use supplemental oxygen at baseline)  Worsening anemia Likely secondary to CKD, Aranesp ordered per nephrology Okay to resume Eliquis with no overt bleeding noted Anemia panel WNL, has received iron infusions recently Received ESA Continue iron infusions 1 unit PRBC transfusion ordered 1/26   Hypertensive emergency (resolved) Essential hypertension (uncontrolled) Continue amlodipine, hydralazine, metoprolol   AKI on CKD stage V Creatinine 4.65; baseline creatinine 3.7-4.1 Renally adjust medications, avoid nephrotoxic agents/dehydration/hypotension Appreciate nephrology recommendations for diuresis, now on torsemide   Morbid obesity (BMI 51.19) Patient was counseled about the cardiovascular and metabolic risk of morbid obesity. Patient was counseled for diet control, exercise regimen and weight loss.    Chronic atrial fibrillation Continue metoprolol Eliquis resumed   DVT prophylaxis: Eliquis Code Status: Full Family Communication: None at bedside Disposition Plan:  Status is: Inpatient Remains inpatient appropriate because: Need for IV medications.  Consultants:  Nephrology  Procedures:  None  Antimicrobials:  None   Subjective: Patient seen and evaluated today with no new acute complaints or concerns. No acute concerns or events noted overnight.  Hemoglobin levels have improved after transfusion.  Her edema appears to be improving as well as her shortness of breath.  Objective: Vitals:   07/24/23 1145 07/24/23 1452 07/24/23 2041 07/25/23 0515  BP: (!) 149/74 (!) 158/92 (!) 147/88 (!) 148/72  Pulse: 84 84 83   Resp: 17 20 19  20  Temp: 98.2 F (36.8 C) 97.9 F (36.6 C) 98.6 F (37 C) 98.5 F (36.9 C)  TempSrc: Oral Oral Oral   SpO2: 96% 95% 94% 97%  Weight:     (!) 140.2 kg  Height:        Intake/Output Summary (Last 24 hours) at 07/25/2023 1610 Last data filed at 07/24/2023 1810 Gross per 24 hour  Intake 992.27 ml  Output 1000 ml  Net -7.73 ml   Filed Weights   07/23/23 0500 07/24/23 0440 07/25/23 0515  Weight: (!) 140.1 kg (!) 142.3 kg (!) 140.2 kg    Examination:  General exam: Appears calm and comfortable  Respiratory system: Clear to auscultation. Respiratory effort normal.  Nasal cannula oxygen Cardiovascular system: S1 & S2 heard, RRR.  Gastrointestinal system: Abdomen is soft Central nervous system: Alert and awake Extremities: No edema Skin: No significant lesions noted Psychiatry: Flat affect.    Data Reviewed: I have personally reviewed following labs and imaging studies  CBC: Recent Labs  Lab 07/22/23 1914 07/23/23 0536 07/23/23 0736 07/24/23 0504 07/24/23 1616 07/25/23 0511  WBC 7.6 7.1  --  6.9  --  7.4  NEUTROABS 5.4  --   --   --   --   --   HGB 7.7* 6.9* 7.0* 6.9* 8.0* 7.7*  HCT 25.6* 23.2* 24.3* 22.9* 26.0* 25.6*  MCV 81.3 82.0  --  81.2  --  81.3  PLT 346 300  --  319  --  294   Basic Metabolic Panel: Recent Labs  Lab 07/22/23 1914 07/23/23 0536 07/24/23 0504 07/25/23 0511  NA 140 143 141 139  K 4.2 3.9 3.9 3.8  CL 112* 116* 113* 112*  CO2 18* 19* 19* 20*  GLUCOSE 149* 110* 101* 92  BUN 39* 38* 40* 42*  CREATININE 4.65* 4.49* 4.80* 4.84*  CALCIUM 8.7* 8.6* 8.5* 8.4*  MG  --  2.0 2.0 1.9  PHOS  --  5.1*  --   --    GFR: Estimated Creatinine Clearance: 20.6 mL/min (A) (by C-G formula based on SCr of 4.84 mg/dL (H)). Liver Function Tests: Recent Labs  Lab 07/23/23 0536  AST 15  ALT 18  ALKPHOS 45  BILITOT 0.4  PROT 5.2*  ALBUMIN 1.7*   No results for input(s): "LIPASE", "AMYLASE" in the last 168 hours. No results for input(s): "AMMONIA" in the last 168 hours. Coagulation Profile: No results for input(s): "INR", "PROTIME" in the last 168 hours. Cardiac Enzymes: No results for  input(s): "CKTOTAL", "CKMB", "CKMBINDEX", "TROPONINI" in the last 168 hours. BNP (last 3 results) No results for input(s): "PROBNP" in the last 8760 hours. HbA1C: No results for input(s): "HGBA1C" in the last 72 hours. CBG: No results for input(s): "GLUCAP" in the last 168 hours. Lipid Profile: No results for input(s): "CHOL", "HDL", "LDLCALC", "TRIG", "CHOLHDL", "LDLDIRECT" in the last 72 hours. Thyroid Function Tests: No results for input(s): "TSH", "T4TOTAL", "FREET4", "T3FREE", "THYROIDAB" in the last 72 hours. Anemia Panel: Recent Labs    07/23/23 0536  VITAMINB12 533  FOLATE 7.5  FERRITIN 143  TIBC 240*  IRON 43  RETICCTPCT 4.6*   Sepsis Labs: No results for input(s): "PROCALCITON", "LATICACIDVEN" in the last 168 hours.  No results found for this or any previous visit (from the past 240 hours).       Radiology Studies: ECHOCARDIOGRAM COMPLETE Result Date: 07/23/2023    ECHOCARDIOGRAM REPORT   Patient Name:   STARLINA LAPRE Date of Exam: 07/23/2023 Medical Rec #:  [id number]      Height:       65.2 in Accession #:    [id number]     Weight:       308.9 lb Date of Birth:  1976/05/27      BSA:          2.386 m Patient Age:    47 years       BP:           133/81 mmHg Patient Gender: F              HR:           87 bpm. Exam Location:  Inpatient Procedure: 2D Echo, 3D Echo, Cardiac Doppler, Color Doppler, Strain Analysis and            Intracardiac Opacification Agent Indications:    CHF-Acute Systolic  History:        Patient has no prior history of Echocardiogram examinations.                 Risk Factors:Hypertension and Former Smoker.  Sonographer:    Karma Ganja Referring Phys: [id number] OLADAPO ADEFESO  Sonographer Comments: Technically challenging study due to limited acoustic windows and patient is obese. Global longitudinal strain was attempted. IMPRESSIONS  1. Left ventricular ejection fraction, by estimation, is 55 to 60%. Left ventricular ejection fraction by 3D volume is 57  %. The left ventricle has normal function. The left ventricle has no regional wall motion abnormalities. There is severe concentric  left ventricular hypertrophy. Left ventricular diastolic parameters are consistent with Grade I diastolic dysfunction (impaired relaxation).  2. Right ventricular systolic function is normal. The right ventricular size is mildly enlarged. There is normal pulmonary artery systolic pressure.  3. Left atrial size was mild to moderately dilated.  4. The mitral valve is normal in structure. Mild mitral valve regurgitation. No evidence of mitral stenosis.  5. The aortic valve is normal in structure. Aortic valve regurgitation is not visualized. No aortic stenosis is present.  6. The inferior vena cava is normal in size with greater than 50% respiratory variability, suggesting right atrial pressure of 3 mmHg. FINDINGS  Left Ventricle: Left ventricular ejection fraction, by estimation, is 55 to 60%. Left ventricular ejection fraction by 3D volume is 57 %. The left ventricle has normal function. The left ventricle has no regional wall motion abnormalities. Definity contrast agent was given IV to delineate the left ventricular endocardial borders. The global longitudinal strain is normal despite suboptimal segment tracking. The left ventricular internal cavity size was normal in size. There is severe concentric left  ventricular hypertrophy. Left ventricular diastolic parameters are consistent with Grade I diastolic dysfunction (impaired relaxation). Right Ventricle: The right ventricular size is mildly enlarged. No increase in right ventricular wall thickness. Right ventricular systolic function is normal. There is normal pulmonary artery systolic pressure. The tricuspid regurgitant velocity is 2.44  m/s, and with an assumed right atrial pressure of 3 mmHg, the estimated right ventricular systolic pressure is 26.8 mmHg. Left Atrium: Left atrial size was mild to moderately dilated. Right Atrium:  Right atrial size was normal in size. Pericardium: There is no evidence of pericardial effusion. Presence of epicardial fat layer. Mitral Valve: The mitral valve is normal in structure. Mild mitral annular calcification. Mild mitral valve regurgitation. No evidence of mitral valve stenosis. Tricuspid Valve: The tricuspid valve is normal in structure. Tricuspid valve regurgitation is trivial. No evidence of tricuspid stenosis. Aortic Valve: The aortic valve is  normal in structure. Aortic valve regurgitation is not visualized. No aortic stenosis is present. Aortic valve mean gradient measures 7.0 mmHg. Aortic valve peak gradient measures 14.0 mmHg. Aortic valve area, by VTI measures 1.81 cm. Pulmonic Valve: The pulmonic valve was normal in structure. Pulmonic valve regurgitation is not visualized. No evidence of pulmonic stenosis. Aorta: The aortic root is normal in size and structure. Venous: The inferior vena cava is normal in size with greater than 50% respiratory variability, suggesting right atrial pressure of 3 mmHg. IAS/Shunts: No atrial level shunt detected by color flow Doppler.  LEFT VENTRICLE PLAX 2D LVIDd:         4.20 cm         Diastology LVIDs:         2.30 cm         LV e' medial:    12.90 cm/s LV PW:         1.50 cm         LV E/e' medial:  8.4 LV IVS:        1.50 cm         LV e' lateral:   9.79 cm/s LVOT diam:     2.00 cm         LV E/e' lateral: 11.0 LV SV:         71 LV SV Index:   30 LVOT Area:     3.14 cm        3D Volume EF                                LV 3D EF:    Left                                             ventricul                                             ar                                             ejection                                             fraction                                             by 3D                                             volume is  57 %.                                 3D Volume EF:                                 3D EF:        57 %                                LV EDV:       192 ml                                LV ESV:       83 ml                                LV SV:        109 ml RIGHT VENTRICLE            IVC RV Basal diam:  4.50 cm    IVC diam: 1.90 cm RV S prime:     7.60 cm/s TAPSE (M-mode): 3.6 cm LEFT ATRIUM              Index        RIGHT ATRIUM           Index LA diam:        5.10 cm  2.14 cm/m   RA Area:     16.70 cm LA Vol (A2C):   110.0 ml 46.11 ml/m  RA Volume:   46.90 ml  19.66 ml/m LA Vol (A4C):   87.2 ml  36.55 ml/m LA Biplane Vol: 98.2 ml  41.16 ml/m  AORTIC VALVE AV Area (Vmax):    1.78 cm AV Area (Vmean):   1.88 cm AV Area (VTI):     1.81 cm AV Vmax:           187.00 cm/s AV Vmean:          125.000 cm/s AV VTI:            0.392 m AV Peak Grad:      14.0 mmHg AV Mean Grad:      7.0 mmHg LVOT Vmax:         106.00 cm/s LVOT Vmean:        74.700 cm/s LVOT VTI:          0.226 m LVOT/AV VTI ratio: 0.58  AORTA Ao Root diam: 3.00 cm MITRAL VALVE                TRICUSPID VALVE MV Area (PHT): 3.60 cm     TR Peak grad:   23.8 mmHg MV Decel Time: 211 msec     TR Vmax:        244.00 cm/s MV E velocity: 108.00 cm/s MV A velocity: 113.00 cm/s  SHUNTS MV E/A ratio:  0.96         Systemic VTI:  0.23 m                             Systemic Diam: 2.00 cm Lavona Mound Tobb DO Electronically signed by  Kardie Tobb DO Signature Date/Time: 07/23/2023/12:49:50 PM    Final         Scheduled Meds:  amLODipine  10 mg Oral Daily   apixaban  5 mg Oral BID   darbepoetin (ARANESP) injection - NON-DIALYSIS  100 mcg Subcutaneous Q Sat-1800   hydrALAZINE  100 mg Oral Q8H   loratadine  10 mg Oral Daily   metoprolol succinate  100 mg Oral Daily   pantoprazole  40 mg Oral Daily   polyethylene glycol  17 g Oral Daily   torsemide  40 mg Oral BID     LOS: 2 days    Time spent: 55 minutes    Dessiree Sze Hoover Brunette, DO [facility]  If 7PM-7AM, please contact night-coverage [email or web address] 07/25/2023, 9:22 AM

## 2023-07-25 NOTE — Progress Notes (Signed)
[facility] Progress Note    Assessment/ Plan:    CKD5 with a baseline creatinine in the 4's-- followed by [facility] office).  Prior to October 2024 she was not followed by a nephrologist and also had financial issues leading to being out of her medications for over 2 weeks.  Patient actually received a ultrasound guided biopsy of her kidneys 04/29/2023 for 12gm proteinuria. 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 No reversibility unfortunately. Has appointment with [facility] on 2/18 for access creation eval for the eventual need for dialysis. Already has follow up appt with [place] on 2/17  -will change her IV lasix 80mg  TID to torsemide 40mg  BID as her volume status has improved -Cr stable 4.8 today, nonoliguric -Creatinine was 3.9-4 earlier in the month as she already has very advanced CKD 4.  She has already been referred to [facility] and has an appointment for permanent access placement evaluation February 2024.  -Monitor Daily I/Os, Daily weight  -Maintain MAP>65 for optimal renal perfusion.  - Avoid nephrotoxic agents such as IV contrast, NSAIDs, and phosphate containing bowel preps (FLEETS)   HFpEF with ejection fraction of 57% on TTE 07/23/2023 currently responding to diuresis. 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 HTN- BP acceptable, torsemide plan as above  Subjective:   Patient seen and examined bedside. Per chart, last sat was 97% on RA. UOP charted (yesterday 1st shift only charted: 1.6L). Patient reports that she continues to improve. Although she denies that she has increased urinary frequency with IV lasix, she does notice that the  volume of her urine is a lot when she does pee. She otherwise denies any chest pain, SOB at the moment, N/V, dysgeusia, loss of appetite, brain fog.   Objective:   BP (!) 148/72 (BP Location: Left Arm)   Pulse 83   Temp 98.5 F (36.9 C)   Resp 20   Ht 5' 5.25" (1.657 m)   Wt (!) 140.2 kg   LMP 06/21/2023 (Approximate)   SpO2 97%   BMI 51.03 kg/m   Intake/Output Summary (Last 24 hours) at 07/25/2023 0827 Last data filed at 07/24/2023 1810 Gross per 24 hour  Intake 1232.27 ml  Output 1600 ml  Net -367.73 ml   Weight change: -2.138 kg  Physical Exam: Gen: NAD, sitting up eating breakfast CVS: RRR Resp: slightly decreased breath sounds bibasilar otherwise no w/r/r/c appreciated on auscultation, normal WOB, unlabored, speaking in full sentences Abd: obese, soft Ext: trace pitting edema b/l LEs Neuro: awake, alert  Imaging: ECHOCARDIOGRAM COMPLETE Result Date: 07/23/2023    ECHOCARDIOGRAM REPORT   Patient Name:   Melissa [name] Date of Exam: 07/23/2023 Medical Rec #:  [id number]      Height:       65.2 in Accession #:    [id number]     Weight:       308.9 lb Date of Birth:  1975/07/24      BSA:          2.386 m Patient Age:    47 years       BP:           133/81 mmHg Patient Gender: F  HR:           87 bpm. Exam Location:  Inpatient Procedure: 2D Echo, 3D Echo, Cardiac Doppler, Color Doppler, Strain Analysis and            Intracardiac Opacification Agent Indications:    CHF-Acute Systolic  History:        Patient has no prior history of Echocardiogram examinations.                 Risk Factors:Hypertension and Former Smoker.  Sonographer:    Karma Ganja Referring Phys: [id number] OLADAPO ADEFESO  Sonographer Comments: Technically challenging study due to limited acoustic windows and patient is obese. Global longitudinal strain was attempted. IMPRESSIONS  1. Left ventricular ejection fraction, by estimation, is 55 to 60%. Left ventricular ejection fraction by 3D volume is 57 %. The  left ventricle has normal function. The left ventricle has no regional wall motion abnormalities. There is severe concentric  left ventricular hypertrophy. Left ventricular diastolic parameters are consistent with Grade I diastolic dysfunction (impaired relaxation).  2. Right ventricular systolic function is normal. The right ventricular size is mildly enlarged. There is normal pulmonary artery systolic pressure.  3. Left atrial size was mild to moderately dilated.  4. The mitral valve is normal in structure. Mild mitral valve regurgitation. No evidence of mitral stenosis.  5. The aortic valve is normal in structure. Aortic valve regurgitation is not visualized. No aortic stenosis is present.  6. The inferior vena cava is normal in size with greater than 50% respiratory variability, suggesting right atrial pressure of 3 mmHg. FINDINGS  Left Ventricle: Left ventricular ejection fraction, by estimation, is 55 to 60%. Left ventricular ejection fraction by 3D volume is 57 %. The left ventricle has normal function. The left ventricle has no regional wall motion abnormalities. Definity contrast agent was given IV to delineate the left ventricular endocardial borders. The global longitudinal strain is normal despite suboptimal segment tracking. The left ventricular internal cavity size was normal in size. There is severe concentric left  ventricular hypertrophy. Left ventricular diastolic parameters are consistent with Grade I diastolic dysfunction (impaired relaxation). Right Ventricle: The right ventricular size is mildly enlarged. No increase in right ventricular wall thickness. Right ventricular systolic function is normal. There is normal pulmonary artery systolic pressure. The tricuspid regurgitant velocity is 2.44  m/s, and with an assumed right atrial pressure of 3 mmHg, the estimated right ventricular systolic pressure is 26.8 mmHg. Left Atrium: Left atrial size was mild to moderately dilated. Right Atrium: Right  atrial size was normal in size. Pericardium: There is no evidence of pericardial effusion. Presence of epicardial fat layer. Mitral Valve: The mitral valve is normal in structure. Mild mitral annular calcification. Mild mitral valve regurgitation. No evidence of mitral valve stenosis. Tricuspid Valve: The tricuspid valve is normal in structure. Tricuspid valve regurgitation is trivial. No evidence of tricuspid stenosis. Aortic Valve: The aortic valve is normal in structure. Aortic valve regurgitation is not visualized. No aortic stenosis is present. Aortic valve mean gradient measures 7.0 mmHg. Aortic valve peak gradient measures 14.0 mmHg. Aortic valve area, by VTI measures 1.81 cm. Pulmonic Valve: The pulmonic valve was normal in structure. Pulmonic valve regurgitation is not visualized. No evidence of pulmonic stenosis. Aorta: The aortic root is normal in size and structure. Venous: The inferior vena cava is normal in size with greater than 50% respiratory variability, suggesting right atrial pressure of 3 mmHg. IAS/Shunts: No atrial level shunt detected by color flow Doppler.  LEFT VENTRICLE PLAX 2D LVIDd:         4.20 cm         Diastology LVIDs:         2.30 cm         LV e' medial:    12.90 cm/s LV PW:         1.50 cm         LV E/e' medial:  8.4 LV IVS:        1.50 cm         LV e' lateral:   9.79 cm/s LVOT diam:     2.00 cm         LV E/e' lateral: 11.0 LV SV:         71 LV SV Index:   30 LVOT Area:     3.14 cm        3D Volume EF                                LV 3D EF:    Left                                             ventricul                                             ar                                             ejection                                             fraction                                             by 3D                                             volume is                                             57 %.                                 3D Volume EF:                                 3D EF:        57 %  LV EDV:       192 ml                                LV ESV:       83 ml                                LV SV:        109 ml RIGHT VENTRICLE            IVC RV Basal diam:  4.50 cm    IVC diam: 1.90 cm RV S prime:     7.60 cm/s TAPSE (M-mode): 3.6 cm LEFT ATRIUM              Index        RIGHT ATRIUM           Index LA diam:        5.10 cm  2.14 cm/m   RA Area:     16.70 cm LA Vol (A2C):   110.0 ml 46.11 ml/m  RA Volume:   46.90 ml  19.66 ml/m LA Vol (A4C):   87.2 ml  36.55 ml/m LA Biplane Vol: 98.2 ml  41.16 ml/m  AORTIC VALVE AV Area (Vmax):    1.78 cm AV Area (Vmean):   1.88 cm AV Area (VTI):     1.81 cm AV Vmax:           187.00 cm/s AV Vmean:          125.000 cm/s AV VTI:            0.392 m AV Peak Grad:      14.0 mmHg AV Mean Grad:      7.0 mmHg LVOT Vmax:         106.00 cm/s LVOT Vmean:        74.700 cm/s LVOT VTI:          0.226 m LVOT/AV VTI ratio: 0.58  AORTA Ao Root diam: 3.00 cm MITRAL VALVE                TRICUSPID VALVE MV Area (PHT): 3.60 cm     TR Peak grad:   23.8 mmHg MV Decel Time: 211 msec     TR Vmax:        244.00 cm/s MV E velocity: 108.00 cm/s MV A velocity: 113.00 cm/s  SHUNTS MV E/A ratio:  0.96         Systemic VTI:  0.23 m                             Systemic Diam: 2.00 cm Kardie Tobb DO Electronically signed by Thomasene Ripple DO Signature Date/Time: 07/23/2023/12:49:50 PM    Final     Labs: BMET Recent Labs  Lab 07/22/23 1914 07/23/23 0536 07/24/23 0504 07/25/23 0511  NA 140 143 141 139  K 4.2 3.9 3.9 3.8  CL 112* 116* 113* 112*  CO2 18* 19* 19* 20*  GLUCOSE 149* 110* 101* 92  BUN 39* 38* 40* 42*  CREATININE 4.65* 4.49* 4.80* 4.84*  CALCIUM 8.7* 8.6* 8.5* 8.4*  PHOS  --  5.1*  --   --    CBC Recent Labs  Lab 07/22/23 1914 07/23/23 0536 07/23/23 0736 07/24/23 0504 07/24/23 1616 07/25/23 0511  WBC 7.6  7.1  --  6.9  --  7.4  NEUTROABS 5.4  --   --   --   --   --   HGB 7.7* 6.9* 7.0* 6.9* 8.0* 7.7*  HCT  25.6* 23.2* 24.3* 22.9* 26.0* 25.6*  MCV 81.3 82.0  --  81.2  --  81.3  PLT 346 300  --  319  --  294    Medications:     amLODipine  10 mg Oral Daily   darbepoetin (ARANESP) injection - NON-DIALYSIS  100 mcg Subcutaneous Q Sat-1800   furosemide  80 mg Intravenous Q8H   hydrALAZINE  100 mg Oral Q8H   loratadine  10 mg Oral Daily   metoprolol succinate  100 mg Oral Daily   pantoprazole  40 mg Oral Daily   polyethylene glycol  17 g Oral Daily      Anthony Sar, MD [facility] 07/25/2023, 8:27 AM

## 2023-07-26 DIAGNOSIS — I509 Heart failure, unspecified: Secondary | ICD-10-CM | POA: Diagnosis not present

## 2023-07-26 LAB — BASIC METABOLIC PANEL
Anion gap: 7 (ref 5–15)
BUN: 40 mg/dL — ABNORMAL HIGH (ref 6–20)
CO2: 20 mmol/L — ABNORMAL LOW (ref 22–32)
Calcium: 8.4 mg/dL — ABNORMAL LOW (ref 8.9–10.3)
Chloride: 112 mmol/L — ABNORMAL HIGH (ref 98–111)
Creatinine, Ser: 4.96 mg/dL — ABNORMAL HIGH (ref 0.44–1.00)
GFR, Estimated: 10 mL/min — ABNORMAL LOW (ref >60)
Glucose, Bld: 87 mg/dL (ref 70–99)
Potassium: 3.7 mmol/L (ref 3.5–5.1)
Sodium: 139 mmol/L (ref 135–145)

## 2023-07-26 LAB — CBC
HCT: 26 % — ABNORMAL LOW (ref 36.0–46.0)
Hemoglobin: 7.7 g/dL — ABNORMAL LOW (ref 12.0–15.0)
MCH: 24.2 pg — ABNORMAL LOW (ref 26.0–34.0)
MCHC: 29.6 g/dL — ABNORMAL LOW (ref 30.0–36.0)
MCV: 81.8 fL (ref 80.0–100.0)
Platelets: 287 10*3/uL (ref 150–400)
RBC: 3.18 MIL/uL — ABNORMAL LOW (ref 3.87–5.11)
RDW: 20.5 % — ABNORMAL HIGH (ref 11.5–15.5)
WBC: 8 10*3/uL (ref 4.0–10.5)
nRBC: 0.6 % — ABNORMAL HIGH (ref 0.0–0.2)

## 2023-07-26 LAB — MAGNESIUM: Magnesium: 1.9 mg/dL (ref 1.7–2.4)

## 2023-07-26 MED ORDER — POTASSIUM CHLORIDE CRYS ER 20 MEQ PO TBCR: 20.0000 meq | EXTENDED_RELEASE_TABLET | Freq: Every day | ORAL | Status: DC

## 2023-07-26 MED ORDER — TORSEMIDE 40 MG PO TABS: 40.0000 mg | ORAL_TABLET | Freq: Two times a day (BID) | ORAL | 2 refills | Status: DC

## 2023-07-26 MED ORDER — POTASSIUM CHLORIDE CRYS ER 20 MEQ PO TBCR: 20.0000 meq | EXTENDED_RELEASE_TABLET | Freq: Every day | ORAL | 1 refills | Status: DC

## 2023-07-26 NOTE — Plan of Care (Signed)
Problem: Activity: Goal: Risk for activity intolerance will decrease Outcome: Progressing   Problem: Coping: Goal: Level of anxiety will decrease Outcome: Progressing   Problem: Pain Managment: Goal: General experience of comfort will improve and/or be controlled Outcome: Progressing

## 2023-07-26 NOTE — Progress Notes (Signed)
Patient ID: Melissa [name], female   DOB: 1975/09/02, 48 y.o.   MRN: [id number] [facility] Progress Note   Assessment/ Plan:   1.  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  The plan is to begin preparations for renal replacement therapy with upcoming vascular surgery appointment on 2/18 right after nephrology appointment ([facility]) on 2/17.  Overnight maintain decent urine output after transition from intravenous furosemide to oral torsemide with renal function that appears to be essentially unchanged and without any acute electrolyte abnormality.  She appears to be suitable to discharge home today on oral torsemide 40 mg twice daily along with K-Dur 20 mEq daily based on current potassium level and anticipated drop with ongoing diuretics. 2.  Acute exacerbation of congestive heart failure: With history of HFpEF (EF 57%) and clinically improving with diuretics.  Her weight is down by 0.4 kg overnight (paradoxically only 0.8 kg since admission) however net -4 L since admission.  She is ambulating without any shortness of breath and does not require any supplemental oxygen. 3.  Anemia: Likely secondary to chronic kidney disease, loaded with intravenous iron and ESA given.  This will be resumed as an outpatient. 4.  Atrial fibrillation: She currently appears to be rate controlled metoprolol and remains on anticoagulation with apixaban.  Subjective:   Reports to be feeling well and states that she ambulated around her room without any shortness of breath.   Objective:   BP (!) 142/79   Pulse 81   Temp 98.6 F (37 C) (Oral)   Resp 18   Ht 5' 5.25" (1.657 m)   Wt (!) 139.8 kg   LMP 06/21/2023 (Approximate)   SpO2 96%   BMI 50.90 kg/m   Intake/Output Summary (Last 24 hours)  at 07/26/2023 0835 Last data filed at 07/26/2023 0500 Gross per 24 hour  Intake 720 ml  Output 2350 ml  Net -1630 ml   Weight change: -0.361 kg  Physical Exam: Gen: Obese woman resting comfortably in bed, eating breakfast CVS: Pulse regular rhythm, normal rate, S1 and S2 normal Resp: Diminished breath sounds over bases due to poor inspiratory effort, no rales/rhonchi Abd: Soft, obese, nontender, bowel sounds normal Ext: Trace lower extremity edema bilaterally  Imaging: No results found.  Labs: BMET Recent Labs  Lab 07/22/23 1914 07/23/23 0536 07/24/23 0504 07/25/23 0511 07/26/23 0423  NA 140 143 141 139 139  K 4.2 3.9 3.9 3.8 3.7  CL 112* 116* 113* 112* 112*  CO2 18* 19* 19* 20* 20*  GLUCOSE 149* 110* 101* 92 87  BUN 39* 38* 40* 42* 40*  CREATININE 4.65* 4.49* 4.80* 4.84* 4.96*  CALCIUM 8.7* 8.6* 8.5* 8.4* 8.4*  PHOS  --  5.1*  --   --   --    CBC Recent Labs  Lab 07/22/23 1914 07/23/23 0536 07/23/23 0736 07/24/23 0504 07/24/23 1616 07/25/23 0511 07/26/23 0423  WBC 7.6 7.1  --  6.9  --  7.4 8.0  NEUTROABS 5.4  --   --   --   --   --   --   HGB 7.7* 6.9*   < > 6.9* 8.0* 7.7* 7.7*  HCT 25.6* 23.2*   < > 22.9* 26.0* 25.6* 26.0*  MCV 81.3 82.0  --  81.2  --  81.3 81.8  PLT 346 300  --  319  --  294 287   < > = values in this interval not displayed.    Medications:     amLODipine  10 mg Oral Daily   apixaban  5 mg Oral BID   darbepoetin (ARANESP) injection - NON-DIALYSIS  100 mcg Subcutaneous Q Sat-1800   hydrALAZINE  100 mg Oral Q8H   loratadine  10 mg Oral Daily   metoprolol succinate  100 mg Oral Daily   pantoprazole  40 mg Oral Daily   polyethylene glycol  17 g Oral Daily   torsemide  40 mg Oral BID   Zetta Bills, MD 07/26/2023, 8:35 AM

## 2023-07-26 NOTE — Discharge Summary (Signed)
Physician Discharge Summary  Melissa [name] NWG:[id number] DOB: [birth year] DOA: 07/22/2023  PCP: Melissa Dubin, FNP  Admit date: 07/22/2023  Discharge date: 07/26/2023  Admitted From:Home  Disposition:  Home  Recommendations for Outpatient Follow-up:  Follow up with PCP in 1-2 weeks and monitor H/H Continue now on torsemide 40 mg twice daily along with potassium supplementation as recommended by nephrology and follow-up with nephrology outpatient Continue other home medications as prior  Home Health: None  Equipment/Devices: None  Discharge Condition:Stable  CODE STATUS: Full  Diet recommendation: Heart Healthy/carb modified  Brief/Interim Summary: Melissa [name] is a 48 y.o. female with medical history significant of hypertension, atrial fibrillation on Eliquis, type 2 diabetes mellitus, CKD 5, obesity who presents to the emergency department due to 1 week onset of shortness of breath.  She has been admitted with volume overload with left-sided pleural effusion and suspicion of new CHF.  She also has AKI on CKD stage IV and has been started on IV Lasix for diuresis.  Her hemoglobin was demonstrating a downward trend and she was started on IV iron infusions as well as ESA and received 1 unit PRBC transfusion 1/26.  She has now been transitioned to torsemide and home Eliquis has been resumed.  Her labs have remained stable and she is now okay for discharge with no other acute events or concerns noted.  2D echocardiogram as noted below with preserved LVEF.  She is planning for dialysis initiation in the near future.  Discharge Diagnoses:  Principal Problem:   Pleural effusion due to CHF (congestive heart failure) (HCC) Active Problems:   Acute kidney injury superimposed on stage 5 chronic kidney disease, not on chronic dialysis (HCC)   Atrial fibrillation, chronic (HCC)   Hypertensive emergency   Morbid obesity with BMI of 50.0-59.9, adult (HCC)   Elevated brain natriuretic peptide  (BNP) level   Acute respiratory failure with hypoxia (HCC)   Essential hypertension  Principal discharge diagnosis: Acute HFpEF in the setting of CKD stage V with associated anemia status post 1 unit PRBC transfusion.  Discharge Instructions  Discharge Instructions     Diet - low sodium heart healthy   Complete by: As directed    Increase activity slowly   Complete by: As directed       Allergies as of 07/26/2023       Reactions   Ozempic (0.25 Or 0.5 Mg-dose) [semaglutide(0.25 Or 0.5mg -dos)] Nausea And Vomiting        Medication List     TAKE these medications    amLODipine 10 MG tablet Commonly known as: NORVASC Take 10 mg by mouth daily.   atorvastatin 80 MG tablet Commonly known as: LIPITOR Take 80 mg by mouth daily.   calcitRIOL 0.25 MCG capsule Commonly known as: ROCALTROL Take 0.25 mcg by mouth daily.   cetirizine 10 MG tablet Commonly known as: ZYRTEC Take 10 mg by mouth daily.   citalopram 20 MG tablet Commonly known as: CELEXA Take 20 mg by mouth daily.   Eliquis 5 MG Tabs tablet Generic drug: apixaban Take 1 tablet (5 mg total) by mouth 2 (two) times daily. Resume from 04/30/2023   ferrous sulfate 325 (65 FE) MG tablet Take 325 mg by mouth daily.   hydrALAZINE 100 MG tablet Commonly known as: APRESOLINE Take 1 tablet (100 mg total) by mouth every 8 (eight) hours.   Jencycla 0.35 MG tablet Generic drug: norethindrone Take 1 tablet by mouth daily.   metoprolol succinate 100 MG 24  hr tablet Commonly known as: TOPROL-XL Take 1 tablet (100 mg total) by mouth daily. Take with or immediately following a meal.   metoprolol succinate 25 MG 24 hr tablet Commonly known as: TOPROL-XL Take 25 mg by mouth daily.   ondansetron 8 MG disintegrating tablet Commonly known as: ZOFRAN-ODT Take 8 mg by mouth every 8 (eight) hours as needed.   pioglitazone 15 MG tablet Commonly known as: ACTOS Take 15 mg by mouth daily.   potassium chloride SA 20 MEQ  tablet Commonly known as: KLOR-CON M Take 1 tablet (20 mEq total) by mouth daily.   Protonix 40 MG tablet Generic drug: pantoprazole Take 40 mg by mouth daily.   sodium bicarbonate 650 MG tablet Take 650 mg by mouth 2 (two) times daily.   Torsemide 40 MG Tabs Take 40 mg by mouth 2 (two) times daily. What changed:  medication strength how much to take when to take this   Tradjenta 5 MG Tabs tablet Generic drug: linagliptin Take 5 mg by mouth daily.        Follow-up Information     [name], Melissa Reil, FNP. Schedule an appointment as soon as possible for a visit in 1 week(s).   Specialty: Family Medicine Contact information: [address] [place] [address] [phone]                Allergies  Allergen Reactions   Ozempic (0.25 Or 0.5 Mg-Dose) [Semaglutide(0.25 Or 0.5mg -Dos)] Nausea And Vomiting    Consultations: Nephrology   Procedures/Studies: ECHOCARDIOGRAM COMPLETE Result Date: 07/23/2023    ECHOCARDIOGRAM REPORT   Patient Name:   Melissa [name] Date of Exam: 07/23/2023 Medical Rec #:  [id number]      Height:       65.2 in Accession #:    [id number]     Weight:       308.9 lb Date of Birth:  06/03/1976      BSA:          2.386 m Patient Age:    47 years       BP:           133/81 mmHg Patient Gender: F              HR:           87 bpm. Exam Location:  Inpatient Procedure: 2D Echo, 3D Echo, Cardiac Doppler, Color Doppler, Strain Analysis and            Intracardiac Opacification Agent Indications:    CHF-Acute Systolic  History:        Patient has no prior history of Echocardiogram examinations.                 Risk Factors:Hypertension and Former Smoker.  Sonographer:    Karma Ganja Referring Phys: [id number] OLADAPO ADEFESO  Sonographer Comments: Technically challenging study due to limited acoustic windows and patient is obese. Global longitudinal strain was attempted. IMPRESSIONS  1. Left ventricular ejection fraction, by estimation, is 55 to 60%. Left ventricular  ejection fraction by 3D volume is 57 %. The left ventricle has normal function. The left ventricle has no regional wall motion abnormalities. There is severe concentric  left ventricular hypertrophy. Left ventricular diastolic parameters are consistent with Grade I diastolic dysfunction (impaired relaxation).  2. Right ventricular systolic function is normal. The right ventricular size is mildly enlarged. There is normal pulmonary artery systolic pressure.  3. Left atrial size was mild to moderately dilated.  4. The mitral valve is normal in structure. Mild mitral valve regurgitation. No evidence of mitral stenosis.  5. The aortic valve is normal in structure. Aortic valve regurgitation is not visualized. No aortic stenosis is present.  6. The inferior vena cava is normal in size with greater than 50% respiratory variability, suggesting right atrial pressure of 3 mmHg. FINDINGS  Left Ventricle: Left ventricular ejection fraction, by estimation, is 55 to 60%. Left ventricular ejection fraction by 3D volume is 57 %. The left ventricle has normal function. The left ventricle has no regional wall motion abnormalities. Definity contrast agent was given IV to delineate the left ventricular endocardial borders. The global longitudinal strain is normal despite suboptimal segment tracking. The left ventricular internal cavity size was normal in size. There is severe concentric left  ventricular hypertrophy. Left ventricular diastolic parameters are consistent with Grade I diastolic dysfunction (impaired relaxation). Right Ventricle: The right ventricular size is mildly enlarged. No increase in right ventricular wall thickness. Right ventricular systolic function is normal. There is normal pulmonary artery systolic pressure. The tricuspid regurgitant velocity is 2.44  m/s, and with an assumed right atrial pressure of 3 mmHg, the estimated right ventricular systolic pressure is 26.8 mmHg. Left Atrium: Left atrial size was mild  to moderately dilated. Right Atrium: Right atrial size was normal in size. Pericardium: There is no evidence of pericardial effusion. Presence of epicardial fat layer. Mitral Valve: The mitral valve is normal in structure. Mild mitral annular calcification. Mild mitral valve regurgitation. No evidence of mitral valve stenosis. Tricuspid Valve: The tricuspid valve is normal in structure. Tricuspid valve regurgitation is trivial. No evidence of tricuspid stenosis. Aortic Valve: The aortic valve is normal in structure. Aortic valve regurgitation is not visualized. No aortic stenosis is present. Aortic valve mean gradient measures 7.0 mmHg. Aortic valve peak gradient measures 14.0 mmHg. Aortic valve area, by VTI measures 1.81 cm. Pulmonic Valve: The pulmonic valve was normal in structure. Pulmonic valve regurgitation is not visualized. No evidence of pulmonic stenosis. Aorta: The aortic root is normal in size and structure. Venous: The inferior vena cava is normal in size with greater than 50% respiratory variability, suggesting right atrial pressure of 3 mmHg. IAS/Shunts: No atrial level shunt detected by color flow Doppler.  LEFT VENTRICLE PLAX 2D LVIDd:         4.20 cm         Diastology LVIDs:         2.30 cm         LV e' medial:    12.90 cm/s LV PW:         1.50 cm         LV E/e' medial:  8.4 LV IVS:        1.50 cm         LV e' lateral:   9.79 cm/s LVOT diam:     2.00 cm         LV E/e' lateral: 11.0 LV SV:         71 LV SV Index:   30 LVOT Area:     3.14 cm        3D Volume EF                                LV 3D EF:    Left  ventricul                                             ar                                             ejection                                             fraction                                             by 3D                                             volume is                                             57 %.                                  3D Volume EF:                                3D EF:        57 %                                LV EDV:       192 ml                                LV ESV:       83 ml                                LV SV:        109 ml RIGHT VENTRICLE            IVC RV Basal diam:  4.50 cm    IVC diam: 1.90 cm RV S prime:     7.60 cm/s TAPSE (M-mode): 3.6 cm LEFT ATRIUM              Index        RIGHT ATRIUM           Index LA diam:        5.10 cm  2.14 cm/m   RA Area:     16.70 cm LA Vol (A2C):   110.0 ml 46.11 ml/m  RA Volume:   46.90 ml  19.66 ml/m LA Vol (A4C):  87.2 ml  36.55 ml/m LA Biplane Vol: 98.2 ml  41.16 ml/m  AORTIC VALVE AV Area (Vmax):    1.78 cm AV Area (Vmean):   1.88 cm AV Area (VTI):     1.81 cm AV Vmax:           187.00 cm/s AV Vmean:          125.000 cm/s AV VTI:            0.392 m AV Peak Grad:      14.0 mmHg AV Mean Grad:      7.0 mmHg LVOT Vmax:         106.00 cm/s LVOT Vmean:        74.700 cm/s LVOT VTI:          0.226 m LVOT/AV VTI ratio: 0.58  AORTA Ao Root diam: 3.00 cm MITRAL VALVE                TRICUSPID VALVE MV Area (PHT): 3.60 cm     TR Peak grad:   23.8 mmHg MV Decel Time: 211 msec     TR Vmax:        244.00 cm/s MV E velocity: 108.00 cm/s MV A velocity: 113.00 cm/s  SHUNTS MV E/A ratio:  0.96         Systemic VTI:  0.23 m                             Systemic Diam: 2.00 cm Lavona Mound Tobb DO Electronically signed by Thomasene Ripple DO Signature Date/Time: 07/23/2023/12:49:50 PM    Final    DG Chest 2 View Result Date: 07/22/2023 CLINICAL DATA:  Shortness of breath. EXAM: CHEST - 2 VIEW COMPARISON:  Chest radiograph dated Nov 09, 2021. FINDINGS: Cardiomegaly with pulmonary vascular congestion. Diffuse bilateral interstitial opacities. A more focal confluent opacity is noted at the left lateral mid lung. Mild left basilar atelectasis with possible trace left effusion. No pneumothorax. No acute osseous abnormality. IMPRESSION: 1. Cardiomegaly with pulmonary vascular congestion and findings  suggestive of interstitial edema. 2. Mild left basilar atelectasis with possible trace left pleural effusion. 3. A more focal confluent opacity at the left lateral mid lung could reflect edema or infiltrate. Electronically Signed   By: Hart Robinsons M.D.   On: 07/22/2023 18:39     Discharge Exam: Vitals:   07/26/23 0513 07/26/23 0748  BP: (!) 154/76 (!) 142/79  Pulse: 81   Resp:    Temp: 98.6 F (37 C)   SpO2: 96%    Vitals:   07/25/23 1331 07/25/23 2047 07/26/23 0513 07/26/23 0748  BP: (!) 154/84 (!) 156/80 (!) 154/76 (!) 142/79  Pulse: 82 83 81   Resp: 18 18    Temp: 98.2 F (36.8 C) 98.6 F (37 C) 98.6 F (37 C)   TempSrc:  Oral Oral   SpO2: 92% 93% 96%   Weight:   (!) 139.8 kg   Height:        General: Pt is alert, awake, not in acute distress Cardiovascular: RRR, S1/S2 +, no rubs, no gallops Respiratory: CTA bilaterally, no wheezing, no rhonchi Abdominal: Soft, NT, ND, bowel sounds + Extremities: no edema, no cyanosis    The results of significant diagnostics from this hospitalization (including imaging, microbiology, ancillary and laboratory) are listed below for reference.     Microbiology: No results found for this or any previous visit (from the past 240 hours).  Labs: BNP (last 3 results) Recent Labs    07/23/23 0018  BNP 412.0*   Basic Metabolic Panel: Recent Labs  Lab 07/22/23 1914 07/23/23 0536 07/24/23 0504 07/25/23 0511 07/26/23 0423  NA 140 143 141 139 139  K 4.2 3.9 3.9 3.8 3.7  CL 112* 116* 113* 112* 112*  CO2 18* 19* 19* 20* 20*  GLUCOSE 149* 110* 101* 92 87  BUN 39* 38* 40* 42* 40*  CREATININE 4.65* 4.49* 4.80* 4.84* 4.96*  CALCIUM 8.7* 8.6* 8.5* 8.4* 8.4*  MG  --  2.0 2.0 1.9 1.9  PHOS  --  5.1*  --   --   --    Liver Function Tests: Recent Labs  Lab 07/23/23 0536  AST 15  ALT 18  ALKPHOS 45  BILITOT 0.4  PROT 5.2*  ALBUMIN 1.7*   No results for input(s): "LIPASE", "AMYLASE" in the last 168 hours. No results for  input(s): "AMMONIA" in the last 168 hours. CBC: Recent Labs  Lab 07/22/23 1914 07/23/23 0536 07/23/23 0736 07/24/23 0504 07/24/23 1616 07/25/23 0511 07/26/23 0423  WBC 7.6 7.1  --  6.9  --  7.4 8.0  NEUTROABS 5.4  --   --   --   --   --   --   HGB 7.7* 6.9* 7.0* 6.9* 8.0* 7.7* 7.7*  HCT 25.6* 23.2* 24.3* 22.9* 26.0* 25.6* 26.0*  MCV 81.3 82.0  --  81.2  --  81.3 81.8  PLT 346 300  --  319  --  294 287   Cardiac Enzymes: No results for input(s): "CKTOTAL", "CKMB", "CKMBINDEX", "TROPONINI" in the last 168 hours. BNP: Invalid input(s): "POCBNP" CBG: No results for input(s): "GLUCAP" in the last 168 hours. D-Dimer No results for input(s): "DDIMER" in the last 72 hours. Hgb A1c No results for input(s): "HGBA1C" in the last 72 hours. Lipid Profile No results for input(s): "CHOL", "HDL", "LDLCALC", "TRIG", "CHOLHDL", "LDLDIRECT" in the last 72 hours. Thyroid function studies No results for input(s): "TSH", "T4TOTAL", "T3FREE", "THYROIDAB" in the last 72 hours.  Invalid input(s): "FREET3" Anemia work up No results for input(s): "VITAMINB12", "FOLATE", "FERRITIN", "TIBC", "IRON", "RETICCTPCT" in the last 72 hours. Urinalysis    Component Value Date/Time   COLORURINE YELLOW 04/20/2023 1800   APPEARANCEUR HAZY (A) 04/20/2023 1800   LABSPEC 1.014 04/20/2023 1800   PHURINE 6.0 04/20/2023 1800   GLUCOSEU >=500 (A) 04/20/2023 1800   HGBUR SMALL (A) 04/20/2023 1800   BILIRUBINUR NEGATIVE 04/20/2023 1800   KETONESUR NEGATIVE 04/20/2023 1800   PROTEINUR >=300 (A) 04/20/2023 1800   NITRITE NEGATIVE 04/20/2023 1800   LEUKOCYTESUR NEGATIVE 04/20/2023 1800   Sepsis Labs Recent Labs  Lab 07/23/23 0536 07/24/23 0504 07/25/23 0511 07/26/23 0423  WBC 7.1 6.9 7.4 8.0   Microbiology No results found for this or any previous visit (from the past 240 hours).   Time coordinating discharge: 35 minutes  SIGNED:   Erick Blinks, DO [facility] 07/26/2023, 10:23 AM  If  7PM-7AM, please contact night-coverage [email or web address]

## 2023-07-26 NOTE — Consult Note (Signed)
[facility] Liaison Note  07/26/2023  Melissa [name] 09-20-1975 [id number]  Location: RN Hospital Liaison screened the patient remotely at [facility].  Insurance:  [employer]   Melissa [name] is a 48 y.o. female who is a Primary Care Patient of Bucio, Julian Reil, FNP [facility]. The patient was screened for readmission hospitalization with noted high risk score for unplanned readmission risk with 2 IP in 6 months.  The patient was assessed for potential Care Management service needs for post hospital transition for care coordination. Review of patient's electronic medical record reveals patient was admitted for Pleural effusion due to CHF. Pt has a non networking provider and insurance carrier not supported by [employer]. Pt is not eligible for services. Liaison in review of chart due to green banner.    [facility] Care Management/Population Health does not replace or interfere with any arrangements made by the Inpatient Transition of Care team.   For questions contact:   Elliot Cousin, RN, [facility] Liaison    [facility], Population Health Office Hours MTWF  8:00 am-6:00 pm Direct Dial: [phone] mobile [phone] [Office toll free line] Office Hours are M-F 8:30 - 5 pm [email or web address]

## 2023-08-09 ENCOUNTER — Encounter: Payer: Self-pay | Admitting: Nephrology

## 2023-08-15 NOTE — Progress Notes (Unsigned)
[facility] [place]  ASSESSMENT / PLAN: Melissa [name] is a 48 y.o. right handed female in need of permanent dialysis access. I reviewed options for dialysis in detail with the patient, including hemodialysis and peritoneal dialysis. I counseled the patient to ask their nephrologist about their candidacy for renal transplant. I counseled the patient that dialysis access requires surveillance and periodic maintenance. Plan to proceed with right brachiocephalic arteriovenous fistula as schedule allows.   CHIEF COMPLAINT: Worsening renal function  HISTORY OF PRESENT ILLNESS: Melissa [name] is a 48 y.o. female referred to clinic for discussion of permanent dialysis access.  The patient has had deteriorating renal function for some time.  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  She is right-handed.  She has never had dialysis access before.  She has never had a pacemaker.  Past Medical History:  Diagnosis Date   Anemia    Anxiety    Chronic kidney disease    Complication of anesthesia    Diabetes mellitus without complication (HCC)    Dysrhythmia    GERD (gastroesophageal reflux disease)    History of hiatal hernia    Hypertension    PONV (postoperative nausea and vomiting)     Past Surgical History:  Procedure Laterality Date   BIOPSY  02/02/2022   Procedure: BIOPSY;  Surgeon: Dolores Frame, MD;  [facility];  Service: Gastroenterology;;   ESOPHAGOGASTRODUODENOSCOPY (EGD) WITH PROPOFOL N/A 02/02/2022   Procedure: ESOPHAGOGASTRODUODENOSCOPY (EGD) WITH PROPOFOL;  Surgeon: Dolores Frame, MD;  Location: [facility];  Service: Gastroenterology;  Laterality: N/A;  240   NO PAST SURGERIES      History reviewed. No pertinent family history.  Social History   Socioeconomic History   Marital status: Married    Spouse name: Not on file   Number of children: Not on file   Years of education: Not on file   Highest education level: Not on file   Occupational History   Not on file  Tobacco Use   Smoking status: Former    Types: Cigarettes    Passive exposure: Past   Smokeless tobacco: Former  Substance and Sexual Activity   Alcohol use: Yes    Comment: occasional drink   Drug use: No   Sexual activity: Never  Other Topics Concern   Not on file  Social History Narrative   Not on file   Social Drivers of Health   Financial Resource Strain: Low Risk  (11/10/2021)   Received from [facility], [facility]   Overall Financial Resource Strain (CARDIA)    Difficulty of Paying Living Expenses: Not very hard  Food Insecurity: No Food Insecurity (07/23/2023)   Hunger Vital Sign    Worried About Running Out of Food in the Last Year: Never true    Ran Out of Food in the Last Year: Never true  Transportation Needs: No Transportation Needs (07/23/2023)   PRAPARE - Administrator, Civil Service (Medical): No    Lack of Transportation (Non-Medical): No  Physical Activity: Not on file  Stress: Not on file  Social Connections: Not on file  Intimate Partner Violence: Not At Risk (07/23/2023)   Humiliation, Afraid, Rape, and Kick questionnaire    Fear of Current or Ex-Partner: No    Emotionally Abused: No    Physically Abused: No    Sexually Abused: No    Allergies  Allergen Reactions   Ozempic (0.25 Or 0.5 Mg-Dose) [Semaglutide(0.25  Or 0.5mg -Dos)] Nausea And Vomiting    Current Outpatient Medications  Medication Sig Dispense Refill   amLODipine (NORVASC) 10 MG tablet Take 10 mg by mouth daily.     atorvastatin (LIPITOR) 80 MG tablet Take 80 mg by mouth daily.     calcitRIOL (ROCALTROL) 0.25 MCG capsule Take 0.25 mcg by mouth daily.     cetirizine (ZYRTEC) 10 MG tablet Take 10 mg by mouth daily.     citalopram (CELEXA) 20 MG tablet Take 20 mg by mouth daily.     ELIQUIS 5 MG TABS tablet Take 1 tablet (5 mg total) by mouth 2 (two) times daily. Resume from 04/30/2023     ferrous sulfate 325 (65 FE) MG tablet  Take 325 mg by mouth daily.     hydrALAZINE (APRESOLINE) 100 MG tablet Take 1 tablet (100 mg total) by mouth every 8 (eight) hours. 90 tablet 0   JENCYCLA 0.35 MG tablet Take 1 tablet by mouth daily.     metoprolol succinate (TOPROL-XL) 100 MG 24 hr tablet Take 1 tablet (100 mg total) by mouth daily. Take with or immediately following a meal. 30 tablet 0   metoprolol succinate (TOPROL-XL) 25 MG 24 hr tablet Take 25 mg by mouth daily.     ondansetron (ZOFRAN-ODT) 8 MG disintegrating tablet Take 8 mg by mouth every 8 (eight) hours as needed.     pantoprazole (PROTONIX) 40 MG tablet Take 40 mg by mouth daily.     pioglitazone (ACTOS) 15 MG tablet Take 15 mg by mouth daily.     potassium chloride SA (KLOR-CON M) 20 MEQ tablet Take 1 tablet (20 mEq total) by mouth daily. 60 tablet 1   sodium bicarbonate 650 MG tablet Take 650 mg by mouth 2 (two) times daily.     torsemide 40 MG TABS Take 40 mg by mouth 2 (two) times daily. 60 tablet 2   TRADJENTA 5 MG TABS tablet Take 5 mg by mouth daily.     No current facility-administered medications for this visit.    PHYSICAL EXAM Vitals:   08/16/23 1437  BP: (!) 181/99  Pulse: 87    Middle-age woman in no distress Regular rate and rhythm Unlabored breathing 2+ radial pulses bilaterally   PERTINENT LABORATORY AND RADIOLOGIC DATA  Most recent CBC    Latest Ref Rng & Units 07/26/2023    4:23 AM 07/25/2023    5:11 AM 07/24/2023    4:16 PM  CBC  WBC 4.0 - 10.5 K/uL 8.0  7.4    Hemoglobin 12.0 - 15.0 g/dL 7.7  7.7  8.0   Hematocrit 36.0 - 46.0 % 26.0  25.6  26.0   Platelets 150 - 400 K/uL 287  294       Most recent CMP    Latest Ref Rng & Units 07/26/2023    4:23 AM 07/25/2023    5:11 AM 07/24/2023    5:04 AM  CMP  Glucose 70 - 99 mg/dL 87  92  960   BUN 6 - 20 mg/dL 40  42  40   Creatinine 0.44 - 1.00 mg/dL 4.54  0.98  1.19   Sodium 135 - 145 mmol/L 139  139  141   Potassium 3.5 - 5.1 mmol/L 3.7  3.8  3.9   Chloride 98 - 111 mmol/L 112   112  113   CO2 22 - 32 mmol/L 20  20  19    Calcium 8.9 - 10.3 mg/dL 8.4  8.4  8.5  Renal function CrCl cannot be calculated (Patient's most recent lab result is older than the maximum 21 days allowed.).  Hgb A1c MFr Bld (%)  Date Value  04/17/2023 8.5 (H)   Vein mapping shows a suitable right cephalic vein only for arteriovenous fistula creation  Rande Brunt. Lenell Antu, MD FACS [facility] Phone Number: [phone] 08/16/2023 3:22 PM   Total time spent on preparing this encounter including chart review, data review, collecting history, examining the patient, coordinating care for this new patient, 60 minutes.  Portions of this report may have been transcribed using voice recognition software.  Every effort has been made to ensure accuracy; however, inadvertent computerized transcription errors may still be present.

## 2023-08-16 ENCOUNTER — Ambulatory Visit (INDEPENDENT_AMBULATORY_CARE_PROVIDER_SITE_OTHER): Payer: 59

## 2023-08-16 ENCOUNTER — Other Ambulatory Visit: Payer: Self-pay

## 2023-08-16 ENCOUNTER — Other Ambulatory Visit: Payer: Self-pay | Admitting: *Deleted

## 2023-08-16 ENCOUNTER — Ambulatory Visit (INDEPENDENT_AMBULATORY_CARE_PROVIDER_SITE_OTHER): Payer: No Typology Code available for payment source | Admitting: Vascular Surgery

## 2023-08-16 ENCOUNTER — Encounter: Payer: Self-pay | Admitting: Vascular Surgery

## 2023-08-16 VITALS — BP 181/99 | HR 87

## 2023-08-16 DIAGNOSIS — N186 End stage renal disease: Secondary | ICD-10-CM | POA: Diagnosis not present

## 2023-08-16 DIAGNOSIS — N184 Chronic kidney disease, stage 4 (severe): Secondary | ICD-10-CM

## 2023-08-17 ENCOUNTER — Other Ambulatory Visit: Payer: Self-pay

## 2023-08-18 ENCOUNTER — Telehealth: Payer: Self-pay

## 2023-08-18 NOTE — Telephone Encounter (Signed)
 Attempted to call for surgery scheduling. LVM

## 2023-08-22 ENCOUNTER — Telehealth: Payer: Self-pay

## 2023-08-22 NOTE — Telephone Encounter (Signed)
 Attempted to call for surgery scheduling. LVM

## 2023-08-23 ENCOUNTER — Encounter: Payer: Self-pay | Admitting: Nephrology

## 2023-08-23 ENCOUNTER — Telehealth: Payer: Self-pay

## 2023-08-23 NOTE — Telephone Encounter (Signed)
 Auth Submission: NO AUTH NEEDED Site of care: Site of care: [facility] Payer: aetna/meritain Medication & CPT/J Code(s) submitted: retacrit [phone] Route of submission (phone, fax, portal): pho\ne Phone # Fax # Auth type: Buy/Bill PB Units/visits requested: 20,000u,q4weeks Reference number: [id number] Approval from: 08/23/23 to 06/27/24

## 2023-08-23 NOTE — Telephone Encounter (Signed)
 Auth Submission: NO AUTH NEEDED Site of care: Site of care: [facility] Payer: meritain/aetna Medication & CPT/J Code(s) submitted: Feraheme (ferumoxytol) [id number] Route of submission (phone, fax, portal): phone Phone # Fax # Auth type: Buy/Bill PB Units/visits requested: 2 doses, 510mg  Reference number: [id number] Approval from: 08/23/23 to 02/26/24

## 2023-08-29 ENCOUNTER — Telehealth: Payer: Self-pay

## 2023-08-29 ENCOUNTER — Other Ambulatory Visit: Payer: Self-pay

## 2023-08-29 DIAGNOSIS — N184 Chronic kidney disease, stage 4 (severe): Secondary | ICD-10-CM

## 2023-08-29 NOTE — Telephone Encounter (Signed)
 Attempted to call for surgery scheduling. LVM

## 2023-09-12 ENCOUNTER — Encounter: Payer: Self-pay | Admitting: Nephrology

## 2023-09-16 ENCOUNTER — Other Ambulatory Visit: Payer: Self-pay

## 2023-09-19 NOTE — Progress Notes (Incomplete)
 PCP - Bucio, Julian Reil, FNP  Cardiologist - ***  PPM/ICD - *** Device Orders - *** Rep Notified - ***  Chest x-ray - *** EKG - *** Stress Test - *** ECHO - *** Cardiac Cath - ***  CPAP - ***  GLP-1 -  Fasting Blood Sugar - *** Checks Blood Sugar ***/day  Blood Thinner Instructions: ELIQUIS  Aspirin Instructions: ***  ERAS Protcol - ***  COVID TEST- ***  Anesthesia review: ***  Patient verbally denies any shortness of breath, fever, cough and chest pain during phone call   -------------  SDW INSTRUCTIONS given:  Your procedure is scheduled on September 21, 2023.  Report to [facility] Main Entrance "A" at 7:00 A.M., and check in at the Admitting office.  Call this number if you have problems the morning of surgery:  [phone]   Remember:  Do not eat or drink after midnight the night before your surgery     Take these medicines the morning of surgery with A SIP OF WATER  acetaminophen (TYLENOL)  amLODipine (NORVASC)  atorvastatin (LIPITOR  cetirizine (ZYRTEC)  citalopram (CELEXA)  hydrALAZINE (APRESOLINE)  metoprolol succinate (TOPROL-XL)  pantoprazole (PROTONIX)    As of today, STOP taking any Aspirin (unless otherwise instructed by your surgeon) Aleve, Naproxen, Ibuprofen, Motrin, Advil, Goody's, BC's, all herbal medications, fish oil, and all vitamins.          WHAT DO I DO ABOUT MY DIABETES MEDICATION?   Do not take oral diabetes medicines TRADJENTA  the morning of surgery.   The day of surgery, do not take other diabetes injectables, including Byetta (exenatide), Bydureon (exenatide ER), Victoza (liraglutide), or Trulicity (dulaglutide).  If your CBG is greater than 220 mg/dL, you may take  of your sliding scale (correction) dose of insulin.   HOW TO MANAGE YOUR DIABETES BEFORE AND AFTER SURGERY  Why is it important to control my blood sugar before and after surgery? Improving blood sugar levels before and after surgery helps healing and can  limit problems. A way of improving blood sugar control is eating a healthy diet by:  Eating less sugar and carbohydrates  Increasing activity/exercise  Talking with your doctor about reaching your blood sugar goals High blood sugars (greater than 180 mg/dL) can raise your risk of infections and slow your recovery, so you will need to focus on controlling your diabetes during the weeks before surgery. Make sure that the doctor who takes care of your diabetes knows about your planned surgery including the date and location.  How do I manage my blood sugar before surgery? Check your blood sugar at least 4 times a day, starting 2 days before surgery, to make sure that the level is not too high or low.  Check your blood sugar the morning of your surgery when you wake up and every 2 hours until you get to the Short Stay unit.  If your blood sugar is less than 70 mg/dL, you will need to treat for low blood sugar: Do not take insulin. Treat a low blood sugar (less than 70 mg/dL) with  cup of clear juice (cranberry or apple), 4 glucose tablets, OR glucose gel. Recheck blood sugar in 15 minutes after treatment (to make sure it is greater than 70 mg/dL). If your blood sugar is not greater than 70 mg/dL on recheck, call [phone] for further instructions. Report your blood sugar to the short stay nurse when you get to Short Stay.  If you are admitted to the hospital  after surgery: Your blood sugar will be checked by the staff and you will probably be given insulin after surgery (instead of oral diabetes medicines) to make sure you have good blood sugar levels. The goal for blood sugar control after surgery is 80-180 mg/dL.               Do not wear jewelry, make up, or nail polish            Do not wear lotions, powders, perfumes/colognes, or deodorant.            Do not shave 48 hours prior to surgery.  Men may shave face and neck.            Do not bring valuables to the hospital.             [facility] is not responsible for any belongings or valuables.  Do NOT Smoke (Tobacco/Vaping) 24 hours prior to your procedure If you use a CPAP at night, you may bring all equipment for your overnight stay.   Contacts, glasses, dentures or bridgework may not be worn into surgery.      For patients admitted to the hospital, discharge time will be determined by your treatment team.   Patients discharged the day of surgery will not be allowed to drive home, and someone needs to stay with them for 24 hours.    Special instructions:   [place]- Preparing For Surgery  Before surgery, you can play an important role. Because skin is not sterile, your skin needs to be as free of germs as possible. You can reduce the number of germs on your skin by washing with CHG (chlorahexidine gluconate) Soap before surgery.  CHG is an antiseptic cleaner which kills germs and bonds with the skin to continue killing germs even after washing.    Oral Hygiene is also important to reduce your risk of infection.  Remember - BRUSH YOUR TEETH THE MORNING OF SURGERY WITH YOUR REGULAR TOOTHPASTE  Please do not use if you have an allergy to CHG or antibacterial soaps. If your skin becomes reddened/irritated stop using the CHG.  Do not shave (including legs and underarms) for at least 48 hours prior to first CHG shower. It is OK to shave your face.  Please follow these instructions carefully.   Shower the NIGHT BEFORE SURGERY and the MORNING OF SURGERY with DIAL Soap.   Pat yourself dry with a CLEAN TOWEL.  Wear CLEAN PAJAMAS to bed the night before surgery  Place CLEAN SHEETS on your bed the night of your first shower and DO NOT SLEEP WITH PETS.   Day of Surgery: Please shower morning of surgery  Wear Clean/Comfortable clothing the morning of surgery Do not apply any deodorants/lotions.   Remember to brush your teeth WITH YOUR REGULAR TOOTHPASTE.   Questions were answered. Patient verbalized  understanding of instructions.

## 2023-09-20 ENCOUNTER — Other Ambulatory Visit: Payer: Self-pay

## 2023-09-20 ENCOUNTER — Encounter (HOSPITAL_COMMUNITY): Payer: Self-pay | Admitting: Vascular Surgery

## 2023-09-20 NOTE — Anesthesia Preprocedure Evaluation (Signed)
 Anesthesia Evaluation    Airway        Dental   Pulmonary former smoker          Cardiovascular hypertension,      Neuro/Psych    GI/Hepatic   Endo/Other  diabetes    Renal/GU      Musculoskeletal   Abdominal   Peds  Hematology   Anesthesia Other Findings   Reproductive/Obstetrics                             Anesthesia Physical Anesthesia Plan  ASA:   Anesthesia Plan:    Post-op Pain Management:    Induction:   PONV Risk Score and Plan:   Airway Management Planned:   Additional Equipment:   Intra-op Plan:   Post-operative Plan:   Informed Consent:   Plan Discussed with:   Anesthesia Plan Comments: (PAT note written 09/20/2023 by Shonna Chock, PA-C.  )       Anesthesia Quick Evaluation

## 2023-09-20 NOTE — Progress Notes (Signed)
 Anesthesia Chart Review: Melissa [name]  Case: [id number] Date/Time: 09/21/23 0915   Procedure: ARTERIOVENOUS (AV) FISTULA CREATION (Right)   Anesthesia type: Monitor Anesthesia Care   Pre-op diagnosis: CKD 4   [facility]: [facility] OR ROOM 12 / [facility] OR   Surgeons: Leonie Douglas, MD      OR Special Needs: "ADD PERIPHERAL NERVE BLOCK"  DISCUSSION: Patient is a 48 year old female scheduled for the above procedure. She is not yet on HD.   History includes former smoker, post-operative N/V, HTN, DM2, PAF (~ 01/2019),GERD, anemia, CKD.   [place] admission 04/17/23-04/29/23 for AKI and severe HTN. Nephrology consulted. 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 Metabolic acidosis treated with sodium bicarbonate. HTN improved with medication adjustments. Creatinine remained elevated but stable.  [place] admission 07/22/23-07/26/23 worsening dyspnea. BP 203/99. She had been out of her BP meds for several weeks due to financial hardship. Creatinine 4.65, eGFR 11, BNP 412. CXR showed cardiomegaly with pulmonary congestion, mild left basilar atelectasias with possible trace effusion. She initially required 2L O2. She was treated with IV Lasix for volume overload/HFpEF in setting of worsening CKD. BP meds resumed. HGB 6.9, s/p 1 unit PRBC. Hospitalist admitted and consulted nephrology. Echo on 07/23/23 showed LVEF 55-60%, no RWMA, severe concentric LVH, grade 1 DD, normal RV systolic function, normal PASP, mild RVH, moderately dilated LA, mild MR.  Anesthesia team to evaluate on the day of surgery. VVS instructed her to hold Eliqus for surgery after 09/17/23 dose.    VS:  Wt Readings from Last 3 Encounters:  07/26/23 (!) 139.8 kg  04/20/23 (!) 141.5 kg  02/01/22 124.1 kg   BP Readings from Last 3 Encounters:  08/16/23 (!) 181/99  07/26/23 (!) 142/79  07/22/23 (!) 170/80   Pulse Readings from Last 3 Encounters:  08/16/23 87  07/26/23 81   07/22/23 90     PROVIDERS: Bucio, Julian Reil, FNP is PCP  - Terrial Rhodes, MD is nephrologist - She had cardiology evaluation on 03/22/19 at [facility] by Lorretta Harp, MD for recent diagnosis of PAF. She was back in SR at that time. Eliquis (or warfarin) and metoprolol planned for management. Amlodipine and hydrochlorothiazide added to Avapro for HTN. Last visit 04/26/19.    LABS: For day of surgery. As of 07/26/23, H/H 7.7/26.0, PLT 287, glucose 87.   IMAGES: CXR 07/22/23: IMPRESSION: 1. Cardiomegaly with pulmonary vascular congestion and findings suggestive of interstitial edema. 2. Mild left basilar atelectasis with possible trace left pleural effusion. 3. A more focal confluent opacity at the left lateral mid lung could reflect edema or infiltrate.    EKG: 07/22/23: Normal sinus rhythm Low voltage QRS Nonspecific T wave abnormality Confirmed by Cathren Laine ([id number]) on 07/23/2023 1:28:00 PM   CV: Echo 07/23/23: IMPRESSIONS   1. Left ventricular ejection fraction, by estimation, is 55 to 60%. Left  ventricular ejection fraction by 3D volume is 57 %. The left ventricle has  normal function. The left ventricle has no regional wall motion  abnormalities. There is severe concentric   left ventricular hypertrophy. Left ventricular diastolic parameters are  consistent with Grade I diastolic dysfunction (impaired relaxation).   2. Right ventricular systolic function is normal. The right ventricular  size is mildly enlarged. There is normal pulmonary artery systolic  pressure.   3. Left atrial size was mild to moderately dilated.   4. The mitral valve is normal in structure. Mild mitral valve  regurgitation. No  evidence of mitral stenosis.   5. The aortic valve is normal in structure. Aortic valve regurgitation is  not visualized. No aortic stenosis is present.   6. The inferior vena cava is normal in size with greater than 50%  respiratory variability, suggesting right  atrial pressure of 3 mmHg.    Past Medical History:  Diagnosis Date   Anemia    Anxiety    Chronic kidney disease    Complication of anesthesia    Diabetes mellitus without complication (HCC)    Dysrhythmia 01/2019   PAF   GERD (gastroesophageal reflux disease)    History of hiatal hernia    Hypertension    PONV (postoperative nausea and vomiting)     Past Surgical History:  Procedure Laterality Date   BIOPSY  02/02/2022   Procedure: BIOPSY;  Surgeon: Dolores Frame, MD;  [facility];  Service: Gastroenterology;;   ESOPHAGOGASTRODUODENOSCOPY (EGD) WITH PROPOFOL N/A 02/02/2022   Procedure: ESOPHAGOGASTRODUODENOSCOPY (EGD) WITH PROPOFOL;  Surgeon: Dolores Frame, MD;  [facility];  Service: Gastroenterology;  Laterality: N/A;  240   NO PAST SURGERIES      MEDICATIONS: No current facility-administered medications for this encounter.    acetaminophen (TYLENOL) 500 MG tablet   amLODipine (NORVASC) 10 MG tablet   atorvastatin (LIPITOR) 80 MG tablet   calcitRIOL (ROCALTROL) 0.25 MCG capsule   cetirizine (ZYRTEC) 10 MG tablet   citalopram (CELEXA) 20 MG tablet   ELIQUIS 5 MG TABS tablet   ferrous sulfate 325 (65 FE) MG tablet   hydrALAZINE (APRESOLINE) 100 MG tablet   metoprolol succinate (TOPROL-XL) 100 MG 24 hr tablet   pantoprazole (PROTONIX) 40 MG tablet   potassium chloride SA (KLOR-CON M) 20 MEQ tablet   sodium bicarbonate 650 MG tablet   torsemide 40 MG TABS   TRADJENTA 5 MG TABS tablet    Shonna Chock, PA-C Surgical Short Stay/Anesthesiology [facility] Phone [phone] [facility] Phone [phone] 09/20/2023 1:08 PM

## 2023-09-21 ENCOUNTER — Other Ambulatory Visit: Payer: Self-pay

## 2023-09-21 ENCOUNTER — Ambulatory Visit (HOSPITAL_BASED_OUTPATIENT_CLINIC_OR_DEPARTMENT_OTHER): Payer: Self-pay | Admitting: Vascular Surgery

## 2023-09-21 ENCOUNTER — Encounter (HOSPITAL_COMMUNITY)
Admission: RE | Disposition: A | Discharge status: Home / Self Care | Payer: Self-pay | Source: Home / Self Care | Attending: Vascular Surgery

## 2023-09-21 ENCOUNTER — Encounter: Payer: Self-pay | Admitting: Nephrology

## 2023-09-21 ENCOUNTER — Other Ambulatory Visit (HOSPITAL_COMMUNITY): Payer: Self-pay

## 2023-09-21 ENCOUNTER — Ambulatory Visit (HOSPITAL_COMMUNITY): Payer: Self-pay | Admitting: Vascular Surgery

## 2023-09-21 ENCOUNTER — Encounter (HOSPITAL_COMMUNITY): Payer: Self-pay | Admitting: Vascular Surgery

## 2023-09-21 ENCOUNTER — Ambulatory Visit (HOSPITAL_COMMUNITY)
Admission: RE | Admit: 2023-09-21 | Discharge: 2023-09-21 | Disposition: A | Discharge status: Home / Self Care | Attending: Vascular Surgery | Admitting: Vascular Surgery

## 2023-09-21 DIAGNOSIS — E6689 Other obesity not elsewhere classified: Secondary | ICD-10-CM | POA: Insufficient documentation

## 2023-09-21 DIAGNOSIS — I509 Heart failure, unspecified: Secondary | ICD-10-CM | POA: Diagnosis not present

## 2023-09-21 DIAGNOSIS — E119 Type 2 diabetes mellitus without complications: Secondary | ICD-10-CM | POA: Insufficient documentation

## 2023-09-21 DIAGNOSIS — N185 Chronic kidney disease, stage 5: Secondary | ICD-10-CM | POA: Diagnosis not present

## 2023-09-21 DIAGNOSIS — F419 Anxiety disorder, unspecified: Secondary | ICD-10-CM | POA: Insufficient documentation

## 2023-09-21 DIAGNOSIS — I132 Hypertensive heart and chronic kidney disease with heart failure and with stage 5 chronic kidney disease, or end stage renal disease: Secondary | ICD-10-CM

## 2023-09-21 DIAGNOSIS — K219 Gastro-esophageal reflux disease without esophagitis: Secondary | ICD-10-CM | POA: Diagnosis not present

## 2023-09-21 DIAGNOSIS — K449 Diaphragmatic hernia without obstruction or gangrene: Secondary | ICD-10-CM | POA: Diagnosis not present

## 2023-09-21 DIAGNOSIS — Z7984 Long term (current) use of oral hypoglycemic drugs: Secondary | ICD-10-CM | POA: Diagnosis not present

## 2023-09-21 DIAGNOSIS — Z87891 Personal history of nicotine dependence: Secondary | ICD-10-CM | POA: Insufficient documentation

## 2023-09-21 DIAGNOSIS — I120 Hypertensive chronic kidney disease with stage 5 chronic kidney disease or end stage renal disease: Secondary | ICD-10-CM | POA: Insufficient documentation

## 2023-09-21 DIAGNOSIS — N186 End stage renal disease: Secondary | ICD-10-CM

## 2023-09-21 DIAGNOSIS — Z7901 Long term (current) use of anticoagulants: Secondary | ICD-10-CM | POA: Insufficient documentation

## 2023-09-21 DIAGNOSIS — D631 Anemia in chronic kidney disease: Secondary | ICD-10-CM | POA: Diagnosis not present

## 2023-09-21 DIAGNOSIS — Z6841 Body Mass Index (BMI) 40.0 and over, adult: Secondary | ICD-10-CM | POA: Diagnosis not present

## 2023-09-21 DIAGNOSIS — E1122 Type 2 diabetes mellitus with diabetic chronic kidney disease: Secondary | ICD-10-CM | POA: Insufficient documentation

## 2023-09-21 DIAGNOSIS — N184 Chronic kidney disease, stage 4 (severe): Secondary | ICD-10-CM

## 2023-09-21 HISTORY — PX: AV FISTULA PLACEMENT: SHX1204

## 2023-09-21 LAB — GLUCOSE, CAPILLARY
Glucose-Capillary: 135 mg/dL — ABNORMAL HIGH (ref 70–99)
Glucose-Capillary: 136 mg/dL — ABNORMAL HIGH (ref 70–99)
Glucose-Capillary: 128 mg/dL — ABNORMAL HIGH (ref 70–99)

## 2023-09-21 LAB — POCT I-STAT, CHEM 8
BUN: 51 mg/dL — ABNORMAL HIGH (ref 6–20)
Calcium, Ion: 1.12 mmol/L — ABNORMAL LOW (ref 1.15–1.40)
Chloride: 113 mmol/L — ABNORMAL HIGH (ref 98–111)
Creatinine, Ser: 6.1 mg/dL — ABNORMAL HIGH (ref 0.44–1.00)
Glucose, Bld: 143 mg/dL — ABNORMAL HIGH (ref 70–99)
HCT: 28 % — ABNORMAL LOW (ref 36.0–46.0)
Hemoglobin: 9.5 g/dL — ABNORMAL LOW (ref 12.0–15.0)
Potassium: 4.8 mmol/L (ref 3.5–5.1)
Sodium: 139 mmol/L (ref 135–145)
TCO2: 21 mmol/L — ABNORMAL LOW (ref 22–32)

## 2023-09-21 LAB — POCT PREGNANCY, URINE: Preg Test, Ur: NEGATIVE

## 2023-09-21 SURGERY — ARTERIOVENOUS (AV) FISTULA CREATION
Anesthesia: Monitor Anesthesia Care | Site: Arm Lower | Laterality: Right

## 2023-09-21 MED ORDER — SODIUM CHLORIDE 0.9 % IV SOLN
INTRAVENOUS | Status: DC
Start: 2023-09-21 — End: 2023-09-21

## 2023-09-21 MED ORDER — CHLORHEXIDINE GLUCONATE 4 % EX SOLN: 60.0000 mL | Freq: Once | CUTANEOUS | Status: DC

## 2023-09-21 MED ORDER — OXYCODONE HCL 5 MG PO TABS: 5.0000 mg | ORAL_TABLET | Freq: Once | ORAL | Status: AC | PRN

## 2023-09-21 MED ORDER — PROPOFOL 10 MG/ML IV BOLUS
INTRAVENOUS | Status: DC | PRN
  Administered 2023-09-21: 10 mg via INTRAVENOUS

## 2023-09-21 MED ORDER — LIDOCAINE 2% (20 MG/ML) 5 ML SYRINGE
INTRAMUSCULAR | Status: AC
  Filled 2023-09-21: qty 5

## 2023-09-21 MED ORDER — OXYCODONE HCL 5 MG/5ML PO SOLN
5.0000 mg | Freq: Once | ORAL | Status: AC | PRN
  Administered 2023-09-21: 5 mg via ORAL

## 2023-09-21 MED ORDER — ACETAMINOPHEN 10 MG/ML IV SOLN: 1000.0000 mg | Freq: Once | INTRAVENOUS | Status: DC | PRN

## 2023-09-21 MED ORDER — SODIUM CHLORIDE 0.9% FLUSH: 3.0000 mL | INTRAVENOUS | Status: DC | PRN

## 2023-09-21 MED ORDER — MIDAZOLAM HCL 2 MG/2ML IJ SOLN
INTRAMUSCULAR | Status: AC
  Administered 2023-09-21: 2 mg via INTRAVENOUS
  Filled 2023-09-21: qty 2

## 2023-09-21 MED ORDER — CHLORHEXIDINE GLUCONATE 0.12 % MT SOLN: 15.0000 mL | Freq: Once | OROMUCOSAL | Status: AC

## 2023-09-21 MED ORDER — FENTANYL CITRATE (PF) 100 MCG/2ML IJ SOLN
INTRAMUSCULAR | Status: AC
  Filled 2023-09-21: qty 2

## 2023-09-21 MED ORDER — PROPOFOL 10 MG/ML IV BOLUS
INTRAVENOUS | Status: AC
  Filled 2023-09-21: qty 20

## 2023-09-21 MED ORDER — CEFAZOLIN SODIUM-DEXTROSE 3-4 GM/150ML-% IV SOLN
INTRAVENOUS | Status: AC
  Filled 2023-09-21: qty 150

## 2023-09-21 MED ORDER — LIDOCAINE-EPINEPHRINE (PF) 1 %-1:200000 IJ SOLN
INTRAMUSCULAR | Status: AC
  Filled 2023-09-21: qty 30

## 2023-09-21 MED ORDER — LIDOCAINE 2% (20 MG/ML) 5 ML SYRINGE
INTRAMUSCULAR | Status: DC | PRN
  Administered 2023-09-21: 40 mg via INTRAVENOUS

## 2023-09-21 MED ORDER — MEPIVACAINE HCL (PF) 2 % IJ SOLN
INTRAMUSCULAR | Status: DC | PRN
Start: 2023-09-21 — End: 2023-09-21
  Administered 2023-09-21: 20 mL via EPIDURAL

## 2023-09-21 MED ORDER — HEPARIN 6000 UNIT IRRIGATION SOLUTION
Status: AC
  Filled 2023-09-21: qty 500

## 2023-09-21 MED ORDER — SODIUM CHLORIDE 0.9% FLUSH: 3.0000 mL | Freq: Two times a day (BID) | INTRAVENOUS | Status: DC

## 2023-09-21 MED ORDER — FENTANYL CITRATE (PF) 100 MCG/2ML IJ SOLN: 25.0000 ug | INTRAMUSCULAR | Status: DC | PRN

## 2023-09-21 MED ORDER — HYDROCODONE-ACETAMINOPHEN 5-325 MG PO TABS
1.0000 | ORAL_TABLET | Freq: Four times a day (QID) | ORAL | 0 refills | Status: DC | PRN
  Filled 2023-09-21: qty 10, 3d supply, fill #0

## 2023-09-21 MED ORDER — LIDOCAINE HCL (PF) 1 % IJ SOLN
INTRAMUSCULAR | Status: AC
  Filled 2023-09-21: qty 30

## 2023-09-21 MED ORDER — OXYCODONE HCL 5 MG/5ML PO SOLN
ORAL | Status: AC
  Filled 2023-09-21: qty 5

## 2023-09-21 MED ORDER — ORAL CARE MOUTH RINSE: 15.0000 mL | Freq: Once | OROMUCOSAL | Status: AC

## 2023-09-21 MED ORDER — ELIQUIS 5 MG PO TABS: 5.0000 mg | ORAL_TABLET | Freq: Two times a day (BID) | ORAL | Status: AC

## 2023-09-21 MED ORDER — HEPARIN SODIUM (PORCINE) 1000 UNIT/ML IJ SOLN
INTRAMUSCULAR | Status: DC | PRN
  Administered 2023-09-21: 5000 [IU] via INTRAVENOUS

## 2023-09-21 MED ORDER — CEFAZOLIN SODIUM-DEXTROSE 3-4 GM/150ML-% IV SOLN
3.0000 g | INTRAVENOUS | Status: AC
  Administered 2023-09-21: 3 g via INTRAVENOUS

## 2023-09-21 MED ORDER — MIDAZOLAM HCL 2 MG/2ML IJ SOLN: 2.0000 mg | Freq: Once | INTRAMUSCULAR | Status: AC

## 2023-09-21 MED ORDER — CHLORHEXIDINE GLUCONATE 0.12 % MT SOLN
OROMUCOSAL | Status: AC
  Administered 2023-09-21: 15 mL via OROMUCOSAL
  Filled 2023-09-21: qty 15

## 2023-09-21 MED ORDER — HEPARIN 6000 UNIT IRRIGATION SOLUTION
Status: DC | PRN
  Administered 2023-09-21: 1

## 2023-09-21 MED ORDER — 0.9 % SODIUM CHLORIDE (POUR BTL) OPTIME
TOPICAL | Status: DC | PRN
  Administered 2023-09-21: 1000 mL

## 2023-09-21 MED ORDER — CEFAZOLIN SODIUM-DEXTROSE 2-4 GM/100ML-% IV SOLN: 2.0000 g | INTRAVENOUS | Status: DC

## 2023-09-21 MED ORDER — ONDANSETRON HCL 4 MG/2ML IJ SOLN: 4.0000 mg | Freq: Once | INTRAMUSCULAR | Status: DC | PRN

## 2023-09-21 MED ORDER — INSULIN ASPART 100 UNIT/ML IJ SOLN: 0.0000 [IU] | INTRAMUSCULAR | Status: DC | PRN

## 2023-09-21 SURGICAL SUPPLY — 37 items
ARMBAND PINK RESTRICT EXTREMIT (MISCELLANEOUS) ×1 IMPLANT
BENZOIN TINCTURE PRP APPL 2/3 (GAUZE/BANDAGES/DRESSINGS) ×1 IMPLANT
CANISTER SUCT 3000ML PPV (MISCELLANEOUS) ×1 IMPLANT
CANNULA VESSEL 3MM 2 BLNT TIP (CANNULA) ×1 IMPLANT
CHLORAPREP W/TINT 26 (MISCELLANEOUS) ×1 IMPLANT
CLIP LIGATING EXTRA MED SLVR (CLIP) ×1 IMPLANT
CLIP LIGATING EXTRA SM BLUE (MISCELLANEOUS) ×1 IMPLANT
CLSR STERI-STRIP ANTIMIC 1/2X4 (GAUZE/BANDAGES/DRESSINGS) IMPLANT
COVER PROBE W GEL 5X96 (DRAPES) IMPLANT
DRSG TEGADERM 4X4.75 (GAUZE/BANDAGES/DRESSINGS) IMPLANT
ELECT REM PT RETURN 9FT ADLT (ELECTROSURGICAL) ×1 IMPLANT
ELECTRODE REM PT RTRN 9FT ADLT (ELECTROSURGICAL) ×1 IMPLANT
GAUZE SPONGE 2X2 STRL 8-PLY (GAUZE/BANDAGES/DRESSINGS) IMPLANT
GAUZE SPONGE 4X4 12PLY STRL (GAUZE/BANDAGES/DRESSINGS) IMPLANT
GLOVE BIO SURGEON STRL SZ8 (GLOVE) ×1 IMPLANT
GOWN STRL REUS W/ TWL LRG LVL3 (GOWN DISPOSABLE) ×2 IMPLANT
GOWN STRL REUS W/ TWL XL LVL3 (GOWN DISPOSABLE) ×1 IMPLANT
INSERT FOGARTY SM (MISCELLANEOUS) IMPLANT
KIT BASIN OR (CUSTOM PROCEDURE TRAY) ×1 IMPLANT
KIT TURNOVER KIT B (KITS) ×1 IMPLANT
LOOP VESSEL MINI RED (MISCELLANEOUS) IMPLANT
NDL 18GX1X1/2 (RX/OR ONLY) (NEEDLE) IMPLANT
NEEDLE 18GX1X1/2 (RX/OR ONLY) (NEEDLE) IMPLANT
NS IRRIG 1000ML POUR BTL (IV SOLUTION) ×1 IMPLANT
PACK CV ACCESS (CUSTOM PROCEDURE TRAY) ×1 IMPLANT
PAD ARMBOARD POSITIONER FOAM (MISCELLANEOUS) ×2 IMPLANT
SLING ARM FOAM STRAP LRG (SOFTGOODS) IMPLANT
SLING ARM FOAM STRAP MED (SOFTGOODS) IMPLANT
STRIP CLOSURE SKIN 1/2X4 (GAUZE/BANDAGES/DRESSINGS) ×1 IMPLANT
SUT MNCRL AB 4-0 PS2 18 (SUTURE) ×1 IMPLANT
SUT PROLENE 6 0 BV (SUTURE) ×1 IMPLANT
SUT VIC AB 3-0 SH 27X BRD (SUTURE) ×1 IMPLANT
SYR 3ML LL SCALE MARK (SYRINGE) IMPLANT
TAPE CLOTH SURG 4X10 WHT LF (GAUZE/BANDAGES/DRESSINGS) IMPLANT
TOWEL GREEN STERILE (TOWEL DISPOSABLE) ×1 IMPLANT
UNDERPAD 30X36 HEAVY ABSORB (UNDERPADS AND DIAPERS) ×1 IMPLANT
WATER STERILE IRR 1000ML POUR (IV SOLUTION) ×1 IMPLANT

## 2023-09-21 NOTE — Anesthesia Procedure Notes (Signed)
 Anesthesia Regional Block: Supraclavicular block   Pre-Anesthetic Checklist: , timeout performed,  Correct Patient, Correct Site, Correct Laterality,  Correct Procedure, Correct Position, site marked,  Risks and benefits discussed,  Surgical consent,  Pre-op evaluation,  At surgeon's request and post-op pain management  Laterality: Right  Prep: Maximum Sterile Barrier Precautions used, chloraprep       Needles:  Injection technique: Single-shot  Needle Type: Echogenic Needle     Needle Length: 5cm  Needle Gauge: 22     Additional Needles:   Procedures:,,,, ultrasound used (permanent image in chart),,    Narrative:  Start time: 09/21/2023 8:50 AM End time: 09/21/2023 9:00 AM Injection made incrementally with aspirations every 5 mL.  Performed by: Personally  Anesthesiologist: Mariann Barter, MD

## 2023-09-21 NOTE — H&P (Signed)
 See below for H&P details, which have not changed. Plan R brachiocephalic arteriovenous fistula creation in OR today.  [facility] [place]  ASSESSMENT / PLAN: Melissa ENAMORADO is a 48 y.o. right handed female in need of permanent dialysis access. I reviewed options for dialysis in detail with the patient, including hemodialysis and peritoneal dialysis. I counseled the patient to ask their nephrologist about their candidacy for renal transplant. I counseled the patient that dialysis access requires surveillance and periodic maintenance. Plan to proceed with right brachiocephalic arteriovenous fistula as schedule allows.   CHIEF COMPLAINT: Worsening renal function  HISTORY OF PRESENT ILLNESS: Melissa [name] is a 48 y.o. female referred to clinic for discussion of permanent dialysis access.  The patient has had deteriorating renal function for some time.  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  She is right-handed.  She has never had dialysis access before.  She has never had a pacemaker.  Past Medical History:  Diagnosis Date   Anemia    Anxiety    Chronic kidney disease    Complication of anesthesia    Diabetes mellitus without complication (HCC)    Dysrhythmia 01/2019   PAF   GERD (gastroesophageal reflux disease)    History of hiatal hernia    Hypertension    PONV (postoperative nausea and vomiting)     Past Surgical History:  Procedure Laterality Date   BIOPSY  02/02/2022   Procedure: BIOPSY;  Surgeon: Dolores Frame, MD;  Location: [facility];  Service: Gastroenterology;;   ESOPHAGOGASTRODUODENOSCOPY (EGD) WITH PROPOFOL N/A 02/02/2022   Procedure: ESOPHAGOGASTRODUODENOSCOPY (EGD) WITH PROPOFOL;  Surgeon: Dolores Frame, MD;  Location: [facility];  Service: Gastroenterology;  Laterality: N/A;  240   NO PAST SURGERIES      History reviewed. No pertinent family history.  Social History   Socioeconomic History   Marital status: Married     Spouse name: Not on file   Number of children: Not on file   Years of education: Not on file   Highest education level: Not on file  Occupational History   Not on file  Tobacco Use   Smoking status: Former    Types: Cigarettes    Passive exposure: Past   Smokeless tobacco: Never  Vaping Use   Vaping status: Never Used  Substance and Sexual Activity   Alcohol use: Not Currently    Comment: occasional drink   Drug use: No   Sexual activity: Never  Other Topics Concern   Not on file  Social History Narrative   Not on file   Social Drivers of Health   Financial Resource Strain: Low Risk  (11/10/2021)   Received from [facility], [facility]   Overall Financial Resource Strain (CARDIA)    Difficulty of Paying Living Expenses: Not very hard  Food Insecurity: No Food Insecurity (07/23/2023)   Hunger Vital Sign    Worried About Running Out of Food in the Last Year: Never true    Ran Out of Food in the Last Year: Never true  Transportation Needs: No Transportation Needs (07/23/2023)   PRAPARE - Administrator, Civil Service (Medical): No    Lack of Transportation (Non-Medical): No  Physical Activity: Not on file  Stress: Not on file  Social Connections: Not on file  Intimate Partner Violence: Not At Risk (07/23/2023)   Humiliation, Afraid, Rape, and Kick questionnaire    Fear of Current or Ex-Partner: No  Emotionally Abused: No    Physically Abused: No    Sexually Abused: No    Allergies  Allergen Reactions   Ozempic (0.25 Or 0.5 Mg-Dose) [Semaglutide(0.25 Or 0.5mg -Dos)] Nausea And Vomiting    Current Facility-Administered Medications  Medication Dose Route Frequency Provider Last Rate Last Admin   0.9 %  sodium chloride infusion   Intravenous Continuous Mariann Barter, MD       ceFAZolin (ANCEF) 3-4 GM/150ML-% IVPB            ceFAZolin (ANCEF) IVPB 3g/150 mL premix  3 g Intravenous 30 min Pre-Op Leonie Douglas, MD       chlorhexidine (HIBICLENS) 4 %  liquid 4 Application  60 mL Topical Once Leonie Douglas, MD       And   [START ON 09/22/2023] chlorhexidine (HIBICLENS) 4 % liquid 4 Application  60 mL Topical Once Leonie Douglas, MD       fentaNYL (SUBLIMAZE) 100 MCG/2ML injection            insulin aspart (novoLOG) injection 0-7 Units  0-7 Units Subcutaneous Q2H PRN Mariann Barter, MD       midazolam (VERSED) 2 MG/2ML injection            sodium chloride flush (NS) 0.9 % injection 3-10 mL  3-10 mL Intravenous Q12H Leonie Douglas, MD       sodium chloride flush (NS) 0.9 % injection 3-10 mL  3-10 mL Intravenous PRN Leonie Douglas, MD        PHYSICAL EXAM Vitals:   09/21/23 0714  BP: (!) 171/93  Pulse: 79  Resp: (!) 24  Temp: 98.9 F (37.2 C)  TempSrc: Oral  SpO2: 91%  Weight: (!) 138.3 kg  Height: 5\' 5"  (1.651 m)    Middle-age woman in no distress Regular rate and rhythm Unlabored breathing 2+ radial pulses bilaterally   PERTINENT LABORATORY AND RADIOLOGIC DATA  Most recent CBC    Latest Ref Rng & Units 09/21/2023    7:59 AM 07/26/2023    4:23 AM 07/25/2023    5:11 AM  CBC  WBC 4.0 - 10.5 K/uL  8.0  7.4   Hemoglobin 12.0 - 15.0 g/dL 9.5  7.7  7.7   Hematocrit 36.0 - 46.0 % 28.0  26.0  25.6   Platelets 150 - 400 K/uL  287  294      Most recent CMP    Latest Ref Rng & Units 09/21/2023    7:59 AM 07/26/2023    4:23 AM 07/25/2023    5:11 AM  CMP  Glucose 70 - 99 mg/dL 161  87  92   BUN 6 - 20 mg/dL 51  40  42   Creatinine 0.44 - 1.00 mg/dL 0.96  0.45  4.09   Sodium 135 - 145 mmol/L 139  139  139   Potassium 3.5 - 5.1 mmol/L 4.8  3.7  3.8   Chloride 98 - 111 mmol/L 113  112  112   CO2 22 - 32 mmol/L  20  20   Calcium 8.9 - 10.3 mg/dL  8.4  8.4     Renal function Estimated Creatinine Clearance: 16.1 mL/min (A) (by C-G formula based on SCr of 6.1 mg/dL (H)).  Hgb A1c MFr Bld (%)  Date Value  04/17/2023 8.5 (H)   Vein mapping shows a suitable right cephalic vein only for arteriovenous fistula  creation  Rande Brunt. Lenell Antu, MD FACS [facility] Office Phone Number: [phone] 09/21/2023 8:56 AM   Total time spent on preparing this encounter including chart review, data review, collecting history, examining the patient, coordinating care for this new patient, 60 minutes.  Portions of this report may have been transcribed using voice recognition software.  Every effort has been made to ensure accuracy; however, inadvertent computerized transcription errors may still be present.

## 2023-09-21 NOTE — Discharge Instructions (Signed)

## 2023-09-21 NOTE — Transfer of Care (Signed)
 Immediate Anesthesia Transfer of Care Note  Patient: Melissa [name]  Procedure(s) Performed: Right Brachiocephalic ARTERIOVENOUS (AV) FISTULA CREATION (Right: Arm Lower)  Patient Location: PACU  Anesthesia Type:MAC combined with regional for post-op pain  Level of Consciousness: awake and alert   Airway & Oxygen Therapy: Patient Spontanous Breathing and Patient connected to nasal cannula oxygen  Post-op Assessment: Report given to RN and Post -op Vital signs reviewed and stable  Post vital signs: Reviewed and stable  Last Vitals:  Vitals Value Taken Time  BP 146/84 09/21/23 1032  Temp 36.6 C 09/21/23 1032  Pulse 73 09/21/23 1035  Resp 18 09/21/23 1035  SpO2 97 % 09/21/23 1035  Vitals shown include unfiled device data.  Last Pain:  Vitals:   09/21/23 1032  TempSrc:   PainSc: 0-No pain         Complications: No notable events documented.

## 2023-09-21 NOTE — Op Note (Signed)
 DATE OF SERVICE: 09/21/2023  PATIENT:  Melissa [name]  48 y.o. female  PRE-OPERATIVE DIAGNOSIS:  CKD 5  POST-OPERATIVE DIAGNOSIS:  Same  PROCEDURE:   Right brachiocephalic arteriovenous fistula  SURGEON:  Surgeons and Role:    * Leonie Douglas, MD - Primary  ASSISTANT: Aggie Moats, PA-C  An experienced assistant was required given the complexity of this procedure and the standard of surgical care. My assistant helped with exposure through counter tension, suctioning, ligation and retraction to better visualize the surgical field.  My assistant expedited sewing during the case by following my sutures. Wherever I use the term "we" in the report, my assistant actively helped me with that portion of the procedure.  ANESTHESIA:   regional and MAC  EBL: minimal  BLOOD ADMINISTERED:none  DRAINS: none   LOCAL MEDICATIONS USED:  NONE  SPECIMEN:  none  COUNTS: confirmed correct.  TOURNIQUET:  none  PATIENT DISPOSITION:  PACU - hemodynamically stable.   Delay start of Pharmacological VTE agent (>24hrs) due to surgical blood loss or risk of bleeding: no  INDICATION FOR PROCEDURE: Melissa [name] is a 48 y.o. female with CKD 5 in need of permanent dialysis access. After careful discussion of risks, benefits, and alternatives the patient was offered right arm arteriovenous fistula. We specifically discussed risk of steal syndrome. The patient understood and wished to proceed.  OPERATIVE FINDINGS: healthy cephalic vein. Healthy brachial artery. Good doppler bruit at completion. Good doppler flow in wrist at completion  DESCRIPTION OF PROCEDURE: After identification of the patient in the pre-operative holding area, the patient was transferred to the operating room. The patient was positioned supine on the operating room table. Anesthesia was induced. The right arm was prepped and draped in standard fashion. A surgical pause was performed confirming correct patient, procedure, and operative  location.  Using intraoperative ultrasound, the course of the right upper extremity superficial veins was mapped.  The cephalic vein appeared adequate for arteriovenous fistula creation.  The brachial artery was similarly mapped.  The artery appeared adequate for arterial venous creation. We ensured there was no anomalous arterial anatomy such as a high bifurcation.  A transverse incision was made in the right arm just distal to the antecubital crease.  Incision was carried down through subcutaneous tissue until the cephalic vein was identified and skeletonized.  We continued our exposure through the aponeurosis of the biceps.  The brachial artery was encountered its usual position.  The artery was circumferentially exposed and encircled with Silastic Vesseloops.  Patient was systemically heparinized.  The distal cephalic vein was transected.  The distal stump of the cephalic vein was oversewn with a 2-0 silk suture.  The proximal vein was controlled with a bulldog clamp.  The brachial artery was clamped proximally medially.  An anterior arteriotomy was made with a 11 blade.  The arteriotomy was extended with Potts scissors.  Using a parachute technique the cephalic vein was anastomosed to the brachial arteriotomy in end-to-side fashion with continuous running suture of 6-0 Prolene.  Immediately prior to completion the anastomosis was flushed and de-aired.  The anastomosis was completed.  Hemostasis was assured.  The fistula was interrogated with Doppler. Audible bruit was heard throughout the course of the cephalic vein.  A radial artery signal was heard which augmented slightly with compression of the fistula.  Upon completion of the case instrument and sharps counts were confirmed correct. The patient was transferred to the PACU in good condition. I was present for all portions  of the procedure.  FOLLOW UP PLAN: Assuming a normal postoperative course, VVS PA will see the patient in 6 weeks with AVF  duplex.   Rande Brunt. Lenell Antu, MD [facility] Phone Number: [phone] 09/21/2023 10:33 AM

## 2023-09-21 NOTE — Anesthesia Postprocedure Evaluation (Signed)
 Anesthesia Post Note  Patient: Keelin S Kanzler  Procedure(s) Performed: Right Brachiocephalic ARTERIOVENOUS (AV) FISTULA CREATION (Right: Arm Lower)     Patient location during evaluation: PACU Anesthesia Type: Regional Level of consciousness: awake and alert Pain management: pain level controlled Vital Signs Assessment: post-procedure vital signs reviewed and stable Respiratory status: spontaneous breathing, nonlabored ventilation, respiratory function stable and patient connected to nasal cannula oxygen Cardiovascular status: blood pressure returned to baseline and stable Postop Assessment: no apparent nausea or vomiting Anesthetic complications: no   No notable events documented.  Last Vitals:  Vitals:   09/21/23 1100 09/21/23 1115  BP: (!) 144/77 (!) 142/83  Pulse: 76 73  Resp: 18 20  Temp:  36.6 C  SpO2: 92% 94%    Last Pain:  Vitals:   09/21/23 1100  TempSrc:   PainSc: 0-No pain                 Mariann Barter

## 2023-09-22 ENCOUNTER — Encounter (HOSPITAL_COMMUNITY): Payer: Self-pay | Admitting: Vascular Surgery

## 2023-09-26 ENCOUNTER — Other Ambulatory Visit: Payer: Self-pay

## 2023-09-26 ENCOUNTER — Emergency Department (HOSPITAL_COMMUNITY)

## 2023-09-26 ENCOUNTER — Encounter (HOSPITAL_COMMUNITY): Payer: Self-pay | Admitting: Emergency Medicine

## 2023-09-26 ENCOUNTER — Inpatient Hospital Stay (HOSPITAL_COMMUNITY)
Admission: EM | Admit: 2023-09-26 | Discharge: 2023-10-02 | DRG: 291 | Disposition: A | Discharge status: Home / Self Care | Attending: Internal Medicine | Admitting: Internal Medicine

## 2023-09-26 DIAGNOSIS — I5033 Acute on chronic diastolic (congestive) heart failure: Secondary | ICD-10-CM | POA: Diagnosis present

## 2023-09-26 DIAGNOSIS — I10 Essential (primary) hypertension: Secondary | ICD-10-CM | POA: Diagnosis not present

## 2023-09-26 DIAGNOSIS — J9601 Acute respiratory failure with hypoxia: Secondary | ICD-10-CM | POA: Diagnosis present

## 2023-09-26 DIAGNOSIS — D631 Anemia in chronic kidney disease: Secondary | ICD-10-CM | POA: Diagnosis present

## 2023-09-26 DIAGNOSIS — R0902 Hypoxemia: Principal | ICD-10-CM

## 2023-09-26 DIAGNOSIS — N185 Chronic kidney disease, stage 5: Secondary | ICD-10-CM | POA: Diagnosis present

## 2023-09-26 DIAGNOSIS — Z7901 Long term (current) use of anticoagulants: Secondary | ICD-10-CM

## 2023-09-26 DIAGNOSIS — E66813 Obesity, class 3: Secondary | ICD-10-CM | POA: Diagnosis present

## 2023-09-26 DIAGNOSIS — Z79899 Other long term (current) drug therapy: Secondary | ICD-10-CM | POA: Diagnosis not present

## 2023-09-26 DIAGNOSIS — K219 Gastro-esophageal reflux disease without esophagitis: Secondary | ICD-10-CM | POA: Diagnosis present

## 2023-09-26 DIAGNOSIS — E872 Acidosis, unspecified: Secondary | ICD-10-CM | POA: Diagnosis not present

## 2023-09-26 DIAGNOSIS — I5031 Acute diastolic (congestive) heart failure: Secondary | ICD-10-CM | POA: Diagnosis not present

## 2023-09-26 DIAGNOSIS — N179 Acute kidney failure, unspecified: Secondary | ICD-10-CM | POA: Diagnosis present

## 2023-09-26 DIAGNOSIS — Z888 Allergy status to other drugs, medicaments and biological substances status: Secondary | ICD-10-CM

## 2023-09-26 DIAGNOSIS — E1122 Type 2 diabetes mellitus with diabetic chronic kidney disease: Secondary | ICD-10-CM | POA: Diagnosis present

## 2023-09-26 DIAGNOSIS — E8809 Other disorders of plasma-protein metabolism, not elsewhere classified: Secondary | ICD-10-CM | POA: Diagnosis not present

## 2023-09-26 DIAGNOSIS — I482 Chronic atrial fibrillation, unspecified: Secondary | ICD-10-CM | POA: Diagnosis present

## 2023-09-26 DIAGNOSIS — Z7984 Long term (current) use of oral hypoglycemic drugs: Secondary | ICD-10-CM | POA: Diagnosis not present

## 2023-09-26 DIAGNOSIS — I480 Paroxysmal atrial fibrillation: Secondary | ICD-10-CM | POA: Diagnosis present

## 2023-09-26 DIAGNOSIS — I132 Hypertensive heart and chronic kidney disease with heart failure and with stage 5 chronic kidney disease, or end stage renal disease: Principal | ICD-10-CM | POA: Diagnosis present

## 2023-09-26 DIAGNOSIS — N189 Chronic kidney disease, unspecified: Secondary | ICD-10-CM | POA: Diagnosis present

## 2023-09-26 DIAGNOSIS — E785 Hyperlipidemia, unspecified: Secondary | ICD-10-CM | POA: Diagnosis present

## 2023-09-26 DIAGNOSIS — N2581 Secondary hyperparathyroidism of renal origin: Secondary | ICD-10-CM | POA: Diagnosis present

## 2023-09-26 DIAGNOSIS — Z87891 Personal history of nicotine dependence: Secondary | ICD-10-CM | POA: Diagnosis not present

## 2023-09-26 DIAGNOSIS — Z6841 Body Mass Index (BMI) 40.0 and over, adult: Secondary | ICD-10-CM

## 2023-09-26 DIAGNOSIS — R0602 Shortness of breath: Secondary | ICD-10-CM | POA: Diagnosis present

## 2023-09-26 DIAGNOSIS — J9621 Acute and chronic respiratory failure with hypoxia: Secondary | ICD-10-CM | POA: Diagnosis present

## 2023-09-26 DIAGNOSIS — F32A Depression, unspecified: Secondary | ICD-10-CM | POA: Diagnosis present

## 2023-09-26 DIAGNOSIS — I509 Heart failure, unspecified: Secondary | ICD-10-CM

## 2023-09-26 DIAGNOSIS — Z1152 Encounter for screening for COVID-19: Secondary | ICD-10-CM

## 2023-09-26 HISTORY — DX: Heart failure, unspecified: I50.9

## 2023-09-26 LAB — COMPREHENSIVE METABOLIC PANEL WITH GFR
ALT: 7 U/L (ref 0–44)
AST: 15 U/L (ref 15–41)
Albumin: 1.8 g/dL — ABNORMAL LOW (ref 3.5–5.0)
Alkaline Phosphatase: 46 U/L (ref 38–126)
Anion gap: 10 (ref 5–15)
BUN: 59 mg/dL — ABNORMAL HIGH (ref 6–20)
CO2: 18 mmol/L — ABNORMAL LOW (ref 22–32)
Calcium: 8.6 mg/dL — ABNORMAL LOW (ref 8.9–10.3)
Chloride: 112 mmol/L — ABNORMAL HIGH (ref 98–111)
Creatinine, Ser: 5.8 mg/dL — ABNORMAL HIGH (ref 0.44–1.00)
GFR, Estimated: 8 mL/min — ABNORMAL LOW (ref >60)
Glucose, Bld: 136 mg/dL — ABNORMAL HIGH (ref 70–99)
Potassium: 4.5 mmol/L (ref 3.5–5.1)
Sodium: 140 mmol/L (ref 135–145)
Total Bilirubin: 0.6 mg/dL (ref 0.0–1.2)
Total Protein: 6 g/dL — ABNORMAL LOW (ref 6.5–8.1)

## 2023-09-26 LAB — URINALYSIS, W/ REFLEX TO CULTURE (INFECTION SUSPECTED)
Bacteria, UA: NONE SEEN
Bilirubin Urine: NEGATIVE
Glucose, UA: 150 mg/dL — AB
Hgb urine dipstick: NEGATIVE
Ketones, ur: NEGATIVE mg/dL
Leukocytes,Ua: NEGATIVE
Nitrite: NEGATIVE
Protein, ur: >=300 mg/dL — AB
Specific Gravity, Urine: 1.012 (ref 1.005–1.030)
pH: 6 (ref 5.0–8.0)

## 2023-09-26 LAB — CBC WITH DIFFERENTIAL/PLATELET
Abs Immature Granulocytes: 0.05 10*3/uL (ref 0.00–0.07)
Basophils Absolute: 0 10*3/uL (ref 0.0–0.1)
Basophils Relative: 0 %
Eosinophils Absolute: 0.4 10*3/uL (ref 0.0–0.5)
Eosinophils Relative: 4 %
HCT: 28.8 % — ABNORMAL LOW (ref 36.0–46.0)
Hemoglobin: 8.5 g/dL — ABNORMAL LOW (ref 12.0–15.0)
Immature Granulocytes: 1 %
Lymphocytes Relative: 8 %
Lymphs Abs: 0.8 10*3/uL (ref 0.7–4.0)
MCH: 25.4 pg — ABNORMAL LOW (ref 26.0–34.0)
MCHC: 29.5 g/dL — ABNORMAL LOW (ref 30.0–36.0)
MCV: 86 fL (ref 80.0–100.0)
Monocytes Absolute: 0.8 10*3/uL (ref 0.1–1.0)
Monocytes Relative: 7 %
Neutro Abs: 8.3 10*3/uL — ABNORMAL HIGH (ref 1.7–7.7)
Neutrophils Relative %: 80 %
Platelets: 218 10*3/uL (ref 150–400)
RBC: 3.35 MIL/uL — ABNORMAL LOW (ref 3.87–5.11)
RDW: 18 % — ABNORMAL HIGH (ref 11.5–15.5)
WBC: 10.3 10*3/uL (ref 4.0–10.5)
nRBC: 0 % (ref 0.0–0.2)

## 2023-09-26 LAB — IRON AND TIBC
Iron: 40 ug/dL (ref 28–170)
Saturation Ratios: 17 % (ref 10.4–31.8)
TIBC: 242 ug/dL — ABNORMAL LOW (ref 250–450)
UIBC: 202 ug/dL

## 2023-09-26 LAB — TROPONIN I (HIGH SENSITIVITY)
Troponin I (High Sensitivity): 42 ng/L — ABNORMAL HIGH (ref <18)
Troponin I (High Sensitivity): 33 ng/L — ABNORMAL HIGH (ref <18)
Troponin I (High Sensitivity): 35 ng/L — ABNORMAL HIGH (ref <18)

## 2023-09-26 LAB — RESP PANEL BY RT-PCR (RSV, FLU A&B, COVID)  RVPGX2
Influenza A by PCR: NEGATIVE
Influenza B by PCR: NEGATIVE
Resp Syncytial Virus by PCR: NEGATIVE
SARS Coronavirus 2 by RT PCR: NEGATIVE

## 2023-09-26 LAB — PHOSPHORUS: Phosphorus: 6.8 mg/dL — ABNORMAL HIGH (ref 2.5–4.6)

## 2023-09-26 LAB — BLOOD GAS, VENOUS
Acid-base deficit: 6.1 mmol/L — ABNORMAL HIGH (ref 0.0–2.0)
Bicarbonate: 20.2 mmol/L (ref 20.0–28.0)
O2 Saturation: 79.6 %
Patient temperature: 37
pCO2, Ven: 42 mmHg — ABNORMAL LOW (ref 44–60)
pH, Ven: 7.29 (ref 7.25–7.43)
pO2, Ven: 50 mmHg — ABNORMAL HIGH (ref 32–45)

## 2023-09-26 LAB — HCG, SERUM, QUALITATIVE: Preg, Serum: NEGATIVE

## 2023-09-26 LAB — BRAIN NATRIURETIC PEPTIDE: B Natriuretic Peptide: 274.5 pg/mL — ABNORMAL HIGH (ref 0.0–100.0)

## 2023-09-26 LAB — TSH: TSH: 2.713 u[IU]/mL (ref 0.350–4.500)

## 2023-09-26 LAB — CBG MONITORING, ED: Glucose-Capillary: 72 mg/dL (ref 70–99)

## 2023-09-26 LAB — MAGNESIUM: Magnesium: 2.2 mg/dL (ref 1.7–2.4)

## 2023-09-26 LAB — LACTIC ACID, PLASMA: Lactic Acid, Venous: 0.5 mmol/L (ref 0.5–1.9)

## 2023-09-26 LAB — FERRITIN: Ferritin: 85 ng/mL (ref 11–307)

## 2023-09-26 MED ORDER — FUROSEMIDE 10 MG/ML IJ SOLN
60.0000 mg | Freq: Once | INTRAMUSCULAR | Status: AC
  Administered 2023-09-26: 60 mg via INTRAVENOUS
  Filled 2023-09-26: qty 6

## 2023-09-26 MED ORDER — FUROSEMIDE 10 MG/ML IJ SOLN
120.0000 mg | Freq: Three times a day (TID) | INTRAVENOUS | Status: AC
  Administered 2023-09-27 (×3): 120 mg via INTRAVENOUS
  Filled 2023-09-26: qty 2
  Filled 2023-09-26 (×2): qty 10
  Filled 2023-09-26: qty 12

## 2023-09-26 NOTE — ED Notes (Signed)
Help get patient into a gown on the monitor patient is resting with call bell in reach

## 2023-09-26 NOTE — Assessment & Plan Note (Signed)
 Obtain anemia panel  Transfuse for Hg <7 , rapidly dropping or  if symptomatic

## 2023-09-26 NOTE — TOC CM/SW Note (Signed)
 Patient is a heart failure patient with pending PT/OT evaluation. Patient is being recommended SNF, per [facility] she has 89 days left for heart failure program. Patient is presenting with pulmonary edema.   Lily Peer, MSW, LCSWA Transition of Care  Clinical Social Worker (ED 3-11 Mon-Fri)  [phone]

## 2023-09-26 NOTE — Assessment & Plan Note (Signed)
this patient has acute respiratory failure with Hypoxia   as documented by the presence of following: O2 saturatio< 90% on RA Likely due to:   CHF exacerbation, Provide O2 therapy and titrate as needed  Continuous pulse ox  check Pulse ox with ambulation prior to discharge  may need  TC consult for home O2 set up

## 2023-09-26 NOTE — ED Provider Notes (Addendum)
 I was asked to follow-up on the patient's labs and likely admit the patient for hypoxia.  Her chest x-ray does show some pulmonary edema.  She remains on oxygen here.  Creatinine is elevated.  She does have a fistula in place and going to be starting dialysis soon.  The patient is given a dose of Lasix here.  Calls placed to hospital service for admission.  I also discussed the case with Dr. Juel Burrow.  He recommends 120 mg of Lasix every 8 hours.  This is ordered.  Physical Exam  BP (!) 144/72   Pulse 77   Temp 98.1 F (36.7 C) (Oral)   Resp 19   Ht 5\' 5"  (1.651 m)   Wt (!) 141.3 kg   LMP 06/22/2023 (Approximate)   SpO2 94%   BMI 51.82 kg/m   Physical Exam Gen: NAD Procedures  Procedures  ED Course / MDM    Medical Decision Making Amount and/or Complexity of Data Reviewed Labs: ordered. Radiology: ordered.  Risk Prescription drug management. Decision regarding hospitalization.          Durwin Glaze, MD 09/26/23 Tonie Griffith    Durwin Glaze, MD 09/26/23 [phone]

## 2023-09-26 NOTE — ED Notes (Signed)
Patient given apple juice and graham crackers. 

## 2023-09-26 NOTE — H&P (Addendum)
 Melissa [name] ZOX:[id number] DOB: [birth year] DOA: 09/26/2023     PCP: Shelby Dubin, FNP   Outpatient Specialists:    NEphrology:    Dr.  Allena Katz    Vascular Dr.  Ace Gins Patient arrived to ER on 09/26/23 at 1020 Referred by Attending Durwin Glaze, MD   Patient coming from:    home Lives  With family    Chief Complaint:   Chief Complaint  Patient presents with   Shortness of Breath    HPI: Melissa [name] is a 48 y.o. 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,, diabetes type 2, hypertension hyperlipidemia, A-fib on Eliquis, morbid obesity    Presented with worsening shortness of breath found to be hypoxic Patient presents from work with shortness of breath She she has known history of CKD has a fistula of the right arm but has not had a chance to start her hemodialysis yet Patient does not endorse any chest pain or diaphoresis no fevers no chills no nausea no vomiting She does have some leg swelling and cough chest tightness and worsening shortness of breath Creatinine found to be 5.8 albumin down to 1.8 hemoglobin 8.5 Troponins 42-33  Patient has known history of A-fib and already on Eliquis  Shortness of breath started when she was walking to her work she became so winded she could not breathe if could not catch her breath Not on home O2 has been compliant with diuretics Noted to be hypoxic started on 3 L  Patient reports that her fistula was just placed last week and is not ready for use. She denies any dietary indiscretion except for maybe she had eaten at church food that was not her usual  No fevers or chills reported cough productive of clear sputum    Denies significant ETOH intake   Does not smoke   Lab Results  Component Value Date   SARSCOV2NAA NEGATIVE 09/26/2023        Regarding pertinent Chronic problems:    Hyperlipidemia -  on statins Lipitor (atorvastatin)      HTN on Norvasc  hydralazine  toprol    chronic CHF diastolic  - Torsemide last echo  Recent Results (from the past [id number] hours)  ECHOCARDIOGRAM COMPLETE   Collection Time: 07/23/23 11:26 AM  Result Value   Weight 4,941.83   Height 65.25   BP 133/81   AR max vel 1.78   AV Area VTI 1.81   AV Mean grad 7.0   AV Peak grad 14.0   Ao pk vel 1.87   AV Area mean vel 1.88   Area-P 1/2 3.60   S' Lateral 2.30   Est EF 55 - 60%   Narrative      ECHOCARDIOGRAM REPORT       1. Left ventricular ejection fraction, by estimation, is 55 to 60%. Left ventricular ejection fraction by 3D volume is 57 %. The left ventricle has normal function. The left ventricle has no regional wall motion abnormalities. There is severe concentric  left ventricular hypertrophy. Left ventricular diastolic parameters are consistent with Grade I diastolic dysfunction (impaired relaxation).  2. Right ventricular systolic function is normal. The right ventricular size is mildly enlarged. There is normal pulmonary artery systolic pressure.  3. Left atrial size was mild to moderately dilated.  4. The mitral valve is normal in structure. Mild mitral valve regurgitation. No evidence of mitral stenosis.  5. The aortic valve is  normal in structure. Aortic valve regurgitation is not visualized. No aortic stenosis is present.  6. The inferior vena cava is normal in size with greater than 50% respiratory variability, suggesting right atrial pressure of 3 mmHg.                DM 2 -  Lab Results  Component Value Date   HGBA1C 8.5 (H) 04/17/2023   on Tradjenta      Morbid obesity-   BMI Readings from Last 1 Encounters:  09/26/23 51.82 kg/m        PAT/A. Fib -   atrial fibrillation CHA2DS2 vas score    4       current  on anticoagulation with Eliquis,      -  Rate control:  Currently controlled with  Toprol     CKD stage V baseline Cr 5 Estimated Creatinine Clearance: 17.2 mL/min (A) (by C-G formula based on SCr of 5.8 mg/dL  (H)).  Lab Results  Component Value Date   CREATININE 5.80 (H) 09/26/2023   CREATININE 6.10 (H) 09/21/2023   CREATININE 4.96 (H) 07/26/2023   Lab Results  Component Value Date   NA 140 09/26/2023   CL 112 (H) 09/26/2023   K 4.5 09/26/2023   CO2 18 (L) 09/26/2023   BUN 59 (H) 09/26/2023   CREATININE 5.80 (H) 09/26/2023   GFRNONAA 8 (L) 09/26/2023   CALCIUM 8.6 (L) 09/26/2023   PHOS 5.1 (H) 07/23/2023   ALBUMIN 1.8 (L) 09/26/2023   GLUCOSE 136 (H) 09/26/2023    Chronic anemia - baseline hg Hemoglobin & Hematocrit  Recent Labs    07/26/23 0423 09/21/23 0759 09/26/23 1111  HGB 7.7* 9.5* 8.5*   Iron/TIBC/Ferritin/ %Sat    Component Value Date/Time   IRON 43 07/23/2023 0536   TIBC 240 (L) 07/23/2023 0536   FERRITIN 143 07/23/2023 0536   IRONPCTSAT 18 07/23/2023 0536    While in ER:   Chest x-ray showing evidence of fluid overload patient was given a dose of Lasix     Lab Orders         Resp panel by RT-PCR (RSV, Flu A&B, Covid) Anterior Nasal Swab         Brain natriuretic peptide         CBC with Differential         Comprehensive metabolic panel         Lactic acid, plasma         Urinalysis, w/ Reflex to Culture (Infection Suspected) -Urine, Clean Catch         Lactic acid, plasma         hCG, serum, qualitative         CBG monitoring, ED      CXR - Cardiomegaly with vascular congestion and edema    Following Medications were ordered in ER: Medications  furosemide (LASIX) injection 60 mg (60 mg Intravenous Given 09/26/23 2001)    _______________________________________________________ ER Provider Called:      Nephrology  Dr.Lin They Recommend admit to medicine   Will see in AM   120 mg of Lasix every 8 hours    ED Triage Vitals  Encounter Vitals Group     BP 09/26/23 1300 138/85     Systolic BP Percentile --      Diastolic BP Percentile --      Pulse Rate 09/26/23 1023 90     Resp 09/26/23 1045 (!) 21     Temp 09/26/23 1023 98.1  F (36.7 C)      Temp Source 09/26/23 1023 Oral     SpO2 09/26/23 1023 (!) 88 %     Weight 09/26/23 1022 (!) 311 lb 6.4 oz (141.3 kg)     Height 09/26/23 1022 5\' 5"  (1.651 m)     Head Circumference --      Peak Flow --      Pain Score 09/26/23 1930 0     Pain Loc --      Pain Education --      Exclude from Growth Chart --   ZHYQ(65)@     _________________________________________ Significant initial  Findings: Abnormal Labs Reviewed  CBC WITH DIFFERENTIAL/PLATELET - Abnormal; Notable for the following components:      Result Value   RBC 3.35 (*)    Hemoglobin 8.5 (*)    HCT 28.8 (*)    MCH 25.4 (*)    MCHC 29.5 (*)    RDW 18.0 (*)    Neutro Abs 8.3 (*)    All other components within normal limits  COMPREHENSIVE METABOLIC PANEL WITH GFR - Abnormal; Notable for the following components:   Chloride 112 (*)    CO2 18 (*)    Glucose, Bld 136 (*)    BUN 59 (*)    Creatinine, Ser 5.80 (*)    Calcium 8.6 (*)    Total Protein 6.0 (*)    Albumin 1.8 (*)    GFR, Estimated 8 (*)    All other components within normal limits  URINALYSIS, W/ REFLEX TO CULTURE (INFECTION SUSPECTED) - Abnormal; Notable for the following components:   APPearance HAZY (*)    Glucose, UA 150 (*)    Protein, ur >=300 (*)    All other components within normal limits  TROPONIN I (HIGH SENSITIVITY) - Abnormal; Notable for the following components:   Troponin I (High Sensitivity) 42 (*)    All other components within normal limits  TROPONIN I (HIGH SENSITIVITY) - Abnormal; Notable for the following components:   Troponin I (High Sensitivity) 33 (*)    All other components within normal limits    _________________________ Troponin  ordered Cardiac Panel (last 3 results) Recent Labs    09/26/23 1111 09/26/23 1311  TROPONINIHS 42* 33*     ECG: Ordered Personally reviewed and interpreted by me showing: HR :  90 Rhythm:  NSR,Sinus rhythm Low voltage, precordial leads Nonspecific T abnormalities, lateral leads when  compared to prior, similar appearance. No STEMI  QTC 436  BNP (last 3 results) Recent Labs    07/23/23 0018  BNP 412.0*       The recent clinical data is shown below. Vitals:   09/26/23 1845 09/26/23 1915 09/26/23 1930 09/26/23 1945  BP: (!) 153/82 (!) 173/90 (!) 153/81 (!) 144/72  Pulse: 81 82 81 77  Resp: (!) 26 20 15 19   Temp:      TempSrc:      SpO2: 94% 96% 97% 94%  Weight:      Height:        WBC     Component Value Date/Time   WBC 10.3 09/26/2023 1111   LYMPHSABS 0.8 09/26/2023 1111   MONOABS 0.8 09/26/2023 1111   EOSABS 0.4 09/26/2023 1111   BASOSABS 0.0 09/26/2023 1111    Lactic Acid, Venous    Component Value Date/Time   LATICACIDVEN 0.5 09/26/2023 1312      UA   no evidence of UTI     Urine analysis:  Component Value Date/Time   COLORURINE YELLOW 09/26/2023 1400   APPEARANCEUR HAZY (A) 09/26/2023 1400   LABSPEC 1.012 09/26/2023 1400   PHURINE 6.0 09/26/2023 1400   GLUCOSEU 150 (A) 09/26/2023 1400   HGBUR NEGATIVE 09/26/2023 1400   BILIRUBINUR NEGATIVE 09/26/2023 1400   KETONESUR NEGATIVE 09/26/2023 1400   PROTEINUR >=300 (A) 09/26/2023 1400   NITRITE NEGATIVE 09/26/2023 1400   LEUKOCYTESUR NEGATIVE 09/26/2023 1400    Results for orders placed or performed during the hospital encounter of 09/26/23  Resp panel by RT-PCR (RSV, Flu A&B, Covid) Anterior Nasal Swab     Status: None   Collection Time: 09/26/23 11:40 AM   Specimen: Anterior Nasal Swab  Result Value Ref Range Status   SARS Coronavirus 2 by RT PCR NEGATIVE NEGATIVE Final   Influenza A by PCR NEGATIVE NEGATIVE Final   Influenza B by PCR NEGATIVE NEGATIVE Final         Resp Syncytial Virus by PCR NEGATIVE NEGATIVE Final          ________________________________________________________________   Venous  Blood Gas result:  pH 7.29 Acid-base deficit 6.1 High  mmol/L   pCO2, Ven 42 Low  mmHg O2 Saturation 79.6 %  pO2, Ven 50 High  mmHg      __________________________________________________________ Recent Labs  Lab 09/21/23 0759 09/26/23 1111  NA 139 140  K 4.8 4.5  CO2  --  18*  GLUCOSE 143* 136*  BUN 51* 59*  CREATININE 6.10* 5.80*  CALCIUM  --  8.6*    Cr   stable,    Lab Results  Component Value Date   CREATININE 5.80 (H) 09/26/2023   CREATININE 6.10 (H) 09/21/2023   CREATININE 4.96 (H) 07/26/2023    Recent Labs  Lab 09/26/23 1111  AST 15  ALT 7  ALKPHOS 46  BILITOT 0.6  PROT 6.0*  ALBUMIN 1.8*   Lab Results  Component Value Date   CALCIUM 8.6 (L) 09/26/2023   PHOS 5.1 (H) 07/23/2023       Plt: Lab Results  Component Value Date   PLT 218 09/26/2023       Recent Labs  Lab 09/21/23 0759 09/26/23 1111  WBC  --  10.3  NEUTROABS  --  8.3*  HGB 9.5* 8.5*  HCT 28.0* 28.8*  MCV  --  86.0  PLT  --  218    HG/HCT  stable,      Component Value Date/Time   HGB 8.5 (L) 09/26/2023 1111   HCT 28.8 (L) 09/26/2023 1111   MCV 86.0 09/26/2023 1111    ___________________________ Hospitalist was called for admission for acute diastolic CHF fluid overload   The following Work up has been ordered so far:  Orders Placed This Encounter  Procedures   Resp panel by RT-PCR (RSV, Flu A&B, Covid) Anterior Nasal Swab   DG Chest Portable 1 View   Brain natriuretic peptide   CBC with Differential   Comprehensive metabolic panel   Lactic acid, plasma   Urinalysis, w/ Reflex to Culture (Infection Suspected) -Urine, Clean Catch   Lactic acid, plasma   hCG, serum, qualitative   ED Cardiac monitoring   Consult to hospitalist   CBG monitoring, ED   EKG 12-Lead     OTHER Significant initial  Findings:  labs showing:     DM  labs:  HbA1C: Recent Labs    04/17/23 1321  HGBA1C 8.5*       CBG (last 3)  Recent Labs    09/26/23  1924  GLUCAP 72          Cultures: No results found for: "SDES", "SPECREQUEST", "CULT", "REPTSTATUS"   Radiological Exams on Admission: DG Chest Portable 1  View Result Date: 09/26/2023 CLINICAL DATA:  Peripheral edema.  Shortness of breath EXAM: PORTABLE CHEST 1 VIEW COMPARISON:  Chest radiograph dated 07/22/2023. FINDINGS: Cardiomegaly with vascular congestion and edema. No large pleural effusion. No pneumothorax. No acute osseous pathology IMPRESSION: Cardiomegaly with vascular congestion and edema. Electronically Signed   By: Elgie Collard M.D.   On: 09/26/2023 13:51   _______________________________________________________________________________________________________ Latest  Blood pressure (!) 144/72, pulse 77, temperature 98.1 F (36.7 C), temperature source Oral, resp. rate 19, height 5\' 5"  (1.651 m), weight (!) 141.3 kg, last menstrual period 06/22/2023, SpO2 94%, unknown if currently breastfeeding.   Vitals  labs and radiology finding personally reviewed  Review of Systems:    Pertinent positives include:  shortness of breath at rest. dyspnea on exertion, productive cough, clear Constitutional:  No weight loss, night sweats, Fevers, chills, fatigue, weight loss  HEENT:  No headaches, Difficulty swallowing,Tooth/dental problems,Sore throat,  No sneezing, itching, ear ache, nasal congestion, post nasal drip,  Cardio-vascular:  No chest pain, Orthopnea, PND, anasarca, dizziness, palpitations.no Bilateral lower extremity swelling  GI:  No heartburn, indigestion, abdominal pain, nausea, vomiting, diarrhea, change in bowel habits, loss of appetite, melena, blood in stool, hematemesis Resp:  no  No excess mucus, no  No non-productive cough, No coughing up of blood.No change in color of mucus.No wheezing. Skin:  no rash or lesions. No jaundice GU:  no dysuria, change in color of urine, no urgency or frequency. No straining to urinate.  No flank pain.  Musculoskeletal:  No joint pain or no joint swelling. No decreased range of motion. No back pain.  Psych:  No change in mood or affect. No depression or anxiety. No memory loss.   Neuro: no localizing neurological complaints, no tingling, no weakness, no double vision, no gait abnormality, no slurred speech, no confusion  All systems reviewed and apart from HOPI all are negative _______________________________________________________________________________________________ Past Medical History:   Past Medical History:  Diagnosis Date   Anemia    Anxiety    Chronic kidney disease    Complication of anesthesia    Diabetes mellitus without complication (HCC)    Dysrhythmia 01/2019   PAF   GERD (gastroesophageal reflux disease)    History of hiatal hernia    Hypertension    PONV (postoperative nausea and vomiting)       Past Surgical History:  Procedure Laterality Date   AV FISTULA PLACEMENT Right 09/21/2023   Procedure: Right Brachiocephalic ARTERIOVENOUS (AV) FISTULA CREATION;  Surgeon: Leonie Douglas, MD;  Location: [facility] OR;  Service: Vascular;  Laterality: Right;   BIOPSY  02/02/2022   Procedure: BIOPSY;  Surgeon: Dolores Frame, MD;  Location: [facility];  Service: Gastroenterology;;   ESOPHAGOGASTRODUODENOSCOPY (EGD) WITH PROPOFOL N/A 02/02/2022   Procedure: ESOPHAGOGASTRODUODENOSCOPY (EGD) WITH PROPOFOL;  Surgeon: Dolores Frame, MD;  Location: [facility];  Service: Gastroenterology;  Laterality: N/A;  240   NO PAST SURGERIES      Social History:  Ambulatory   independently      reports that she has quit smoking. Her smoking use included cigarettes. She has been exposed to tobacco smoke. She has never used smokeless tobacco. She reports that she does not currently use alcohol. She reports that she does not use drugs.   Family History:  History reviewed. No pertinent family history. ______________________________________________________________________________________________ Allergies: Allergies  Allergen Reactions   Ozempic (0.25 Or 0.5 Mg-Dose) [Semaglutide(0.25 Or 0.5mg -Dos)] Nausea And Vomiting     Prior to  Admission medications   Medication Sig Start Date End Date Taking? Authorizing Provider  acetaminophen (TYLENOL) 500 MG tablet Take 1,000 mg by mouth every 6 (six) hours as needed for moderate pain (pain score 4-6).    [provider]  amLODipine (NORVASC) 10 MG tablet Take 10 mg by mouth daily. 12/16/21   [provider]  atorvastatin (LIPITOR) 80 MG tablet Take 80 mg by mouth daily. 07/08/23   [provider]  calcitRIOL (ROCALTROL) 0.25 MCG capsule Take 0.25 mcg by mouth daily. 07/15/23   [provider]  cetirizine (ZYRTEC) 10 MG tablet Take 10 mg by mouth daily. 09/16/20   [provider]  citalopram (CELEXA) 20 MG tablet Take 20 mg by mouth daily. 05/07/19   [provider]  ELIQUIS 5 MG TABS tablet Take 1 tablet (5 mg total) by mouth 2 (two) times daily. Resume from 04/30/2023 09/23/23   Emilie Rutter, PA-C  ferrous sulfate 325 (65 FE) MG tablet Take 325 mg by mouth daily.    [provider]  hydrALAZINE (APRESOLINE) 100 MG tablet Take 1 tablet (100 mg total) by mouth every 8 (eight) hours. 04/29/23   Glade Lloyd, MD  HYDROcodone-acetaminophen (NORCO/VICODIN) 5-325 MG tablet Take 1 tablet by mouth every 6 (six) hours as needed for moderate pain (pain score 4-6). 09/21/23   Emilie Rutter, PA-C  metoprolol succinate (TOPROL-XL) 100 MG 24 hr tablet Take 1 tablet (100 mg total) by mouth daily. Take with or immediately following a meal. 04/30/23   Glade Lloyd, MD  pantoprazole (PROTONIX) 40 MG tablet Take 40 mg by mouth daily.    [provider]  potassium chloride SA (KLOR-CON M) 20 MEQ tablet Take 1 tablet (20 mEq total) by mouth daily. 07/26/23   Sherryll Burger, Pratik D, DO  sodium bicarbonate 650 MG tablet Take 650 mg by mouth 2 (two) times daily. 07/08/23   [provider]  torsemide 40 MG TABS Take 40 mg by mouth 2 (two) times daily. 07/26/23   Sherryll Burger, Pratik D, DO  TRADJENTA 5 MG TABS tablet Take 5 mg by mouth daily.  07/08/23   [provider]    ___________________________________________________________________________________________________ Physical Exam:    09/26/2023    7:45 PM 09/26/2023    7:30 PM 09/26/2023    7:15 PM  Vitals with BMI  Systolic 144 153 409  Diastolic 72 81 90  Pulse 77 81 82     1. General:  in No  Acute distress    Chronically ill -appearing 2. Psychological: Alert and   Oriented 3. Head/ENT:   Moist  Mucous Membranes                          Head Non traumatic, neck supple                       Poor Dentition 4. SKIN: normal   Skin turgor,  Skin clean Dry and intact no rash    5. Heart: Regular rate and rhythm no  Murmur, no Rub or gallop 6. Lungs:   no wheezes   crackles at the bases 7. Abdomen: Soft,  non-tender, Non distended   obese  bowel sounds present 8. Lower extremities: no clubbing, cyanosis, edema 9. Neurologically Grossly intact,  moving all 4 extremities equally   10. MSK: Normal range of motion    Chart has been reviewed  ______________________________________________________________________________________________  Assessment/Plan  48 y.o. 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,, diabetes type 2, hypertension hyperlipidemia, A-fib on Eliquis, morbid obesity   Admitted for  diastolic CHF exacerbation    Present on Admission:  Acute on chronic diastolic CHF (congestive heart failure) (HCC)  Atrial fibrillation, chronic (HCC)  Acute respiratory failure with hypoxia (HCC)  Anemia of chronic renal failure  Essential hypertension  CKD (chronic kidney disease) stage 5, GFR less than 15 ml/min (HCC)   Atrial fibrillation, chronic (HCC)  Continue Eliquis 5 mg And continue Toprol  Acute on chronic diastolic CHF (congestive heart failure) (HCC) - Pt diagnosed with CHF based on presence of the following:  OA, rales on exam,   Pulmonary edema on CXR, and   bilateral leg edema,  DOE,    With noted response to IV diuretic in ER  admit on telemetry,  cycle cardiac enzymes, Cardiac Panel (last 3 results) Recent Labs    09/26/23 1111 09/26/23 1311 09/26/23 2034  TROPONINIHS 42* 33* 35*     obtain serial ECG  to evaluate for ischemia as a cause of heart failure  monitor daily weight:  Filed Weights   09/26/23 1022  Weight: (!) 141.3 kg   Last BNP BNP (last 3 results) Recent Labs    07/23/23 0018 09/26/23 1111  BNP 412.0* 274.5*       diurese with IV lasix   120 mg IV  q8h and monitor orthostatics and creatinine to avoid over diuresis.  Order echogram to evaluate EF and valves  ACE/ARBi  Contraindicated      Acute respiratory failure with hypoxia (HCC)  this patient has acute respiratory failure with Hypoxia as documented by the presence of following: O2 saturatio< 90% on RA  Likely due to:  CHF exacerbation,  Provide O2 therapy and titrate as needed  Continuous pulse ox   check Pulse ox with ambulation prior to discharge   may need  TC consult for home O2 set up     Anemia of chronic renal failure Obtain anemia panel  Transfuse for Hg <7 , rapidly dropping or  if symptomatic   Essential hypertension Given patient is going to be receiving high-dose of Lasix we will hold off on home BP meds for tonight to give room for diuresis  Morbid obesity with BMI of 50.0-59.9, adult (HCC) Contributing to comorbidity and complicating medical management  Body mass index is 51.82 kg/m.  Nutritional follow up as an out pt would be recommended   CKD (chronic kidney disease) stage 5, GFR less than 15 ml/min [facility]) Nephrology is aware If need for acute nephrological consult please reconsult They have recommended diuresis with Lasix 120 IV every 8 hours If patient is able to tolerate Lasix and responds well there is no indication for hemodialysis patient is still making good amount of urine potassium in normal limits    Other plan as per  orders.  DVT prophylaxis:  SCD      Code Status:    Code Status: Prior FULL CODE  as per patient   I had personally discussed CODE STATUS with patient   ACP   none   Family Communication:   Family not at  Bedside    Diet renal/ heart healthy   Disposition Plan:     To home once  workup is complete and patient is stable   Following barriers for discharge:                                                       Electrolytes corrected                               Anemia   stable                                                         Will need to be able to tolerate PO                            Will likely need home health, home O2, set up                                Consult Orders  (From admission, onward)           Start     Ordered   09/26/23 1954  Consult to hospitalist  PG SENT BY DELORIS  Once       Provider:  (Not yet assigned)  Question Answer Comment  Place call to: [facility]   Reason for Consult Admit      09/26/23 1953                               Consults called: Case discussed with nephrology previous reconsult if official consult needed   Admission status:  ED Disposition     ED Disposition  Admit   Condition  --   Comment  Hospital Area: [facility] [[id number]]  Level of Care: Progressive [102]  Admit to Progressive based on following criteria: CARDIOVASCULAR & THORACIC of moderate stability with acute coronary syndrome symptoms/low risk myocardial infarction/hypertensive urgency/arrhythmias/heart failure potentially compromising stability and stable post cardiovascular intervention patients.  May admit patient to [facility] or [facility] if equivalent level of care is available:: No  Covid Evaluation: Asymptomatic - no recent exposure (last 10 days) testing not required  Diagnosis: Acute on chronic diastolic CHF (congestive heart failure) [facility]) [[id number]]  Admitting Physician: Therisa Doyne [[id number]]  Attending  Physician: Therisa Doyne [[id number]]  Certification:: I certify this patient will need inpatient services for at least 2 midnights  Expected Medical Readiness: 09/28/2023              inpatient     I Expect 2 midnight stay secondary to severity of patient's current illness need for inpatient interventions justified by the following:  hemodynamic instability despite optimal treatment (tachycardia  hypoxia,  )   Severe lab/radiological/exam abnormalities including:   Acute diastolic CHF and extensive comorbidities including: DM2   CHF Morbid Obesity   CKD   That are currently affecting medical management.   I expect  patient to be hospitalized for 2 midnights requiring inpatient medical care.  Patient is at high risk for adverse outcome (  such as loss of life or disability) if not treated.  Indication for inpatient stay as follows:   Hemodynamic instability despite maximal medical therapy,    New or worsening hypoxia   Need for IV  diuretics    Level of care       progressive     Lab Results  Component Value Date   SARSCOV2NAA NEGATIVE 09/26/2023     Precautions: admitted as   Covid Negative    Alyse Kathan 09/26/2023, 9:32 PM    [facility]     after 2 AM please page floor coverage PA If 7AM-7PM, please contact the day team taking care of the patient using [email or web address]

## 2023-09-26 NOTE — Assessment & Plan Note (Addendum)
 Continue Eliquis 5 mg And continue Toprol

## 2023-09-26 NOTE — ED Notes (Signed)
 Patient was able to ambulate independently to the bedside commode.

## 2023-09-26 NOTE — Assessment & Plan Note (Signed)
 Contributing to comorbidity and complicating medical management  Body mass index is 51.82 kg/m.  Nutritional follow up as an out pt would be recommended

## 2023-09-26 NOTE — Subjective & Objective (Signed)
 Patient presents from work with shortness of breath She she has known history of CKD has a fistula of the right arm but has not had a chance to start her hemodialysis yet Patient does not endorse any chest pain or diaphoresis no fevers no chills no nausea no vomiting She does have some leg swelling and cough chest tightness and worsening shortness of breath Creatinine found to be 5.8 albumin down to 1.8 hemoglobin 8.5 Troponins 42-33  Patient has known history of A-fib and already on Eliquis  Shortness of breath started when she was walking to her work she became so winded she could not breathe if could not catch her breath Not on home O2 has been compliant with diuretics Noted to be hypoxic started on 3 L

## 2023-09-26 NOTE — Assessment & Plan Note (Addendum)
 Nephrology is aware If need for acute nephrological consult please reconsult They have recommended diuresis with Lasix 120 IV every 8 hours If patient is able to tolerate Lasix and responds well there is no indication for hemodialysis patient is still making good amount of urine potassium in normal limits

## 2023-09-26 NOTE — ED Provider Notes (Signed)
 [place] EMERGENCY DEPARTMENT AT [facility] Provider Note   CSN: [id number] Arrival date & time: 09/26/23  1020     History  Chief Complaint  Patient presents with   Shortness of Breath    Melissa [name] is a 48 y.o. female.  The history is provided by the patient and medical records. No language interpreter was used.  Shortness of Breath Severity:  Severe Onset quality:  Gradual Duration:  1 day Timing:  Intermittent Progression:  Waxing and waning Chronicity:  Recurrent Context: pollens and URI (mild cough)   Relieved by:  Rest and oxygen Worsened by:  Exertion Ineffective treatments:  None tried Associated symptoms: cough (nild)   Associated symptoms: no abdominal pain, no chest pain, no diaphoresis, no fever, no headaches, no neck pain, no rash, no sputum production, no vomiting and no wheezing   Risk factors: no hx of PE/DVT        Home Medications Prior to Admission medications   Medication Sig Start Date End Date Taking? Authorizing Provider  acetaminophen (TYLENOL) 500 MG tablet Take 1,000 mg by mouth every 6 (six) hours as needed for moderate pain (pain score 4-6).    [provider]  amLODipine (NORVASC) 10 MG tablet Take 10 mg by mouth daily. 12/16/21   [provider]  atorvastatin (LIPITOR) 80 MG tablet Take 80 mg by mouth daily. 07/08/23   [provider]  calcitRIOL (ROCALTROL) 0.25 MCG capsule Take 0.25 mcg by mouth daily. 07/15/23   [provider]  cetirizine (ZYRTEC) 10 MG tablet Take 10 mg by mouth daily. 09/16/20   [provider]  citalopram (CELEXA) 20 MG tablet Take 20 mg by mouth daily. 05/07/19   [provider]  ELIQUIS 5 MG TABS tablet Take 1 tablet (5 mg total) by mouth 2 (two) times daily. Resume from 04/30/2023 09/23/23   Emilie Rutter, PA-C  ferrous sulfate 325 (65 FE) MG tablet Take 325 mg by mouth daily.    [provider]  hydrALAZINE (APRESOLINE) 100 MG tablet  Take 1 tablet (100 mg total) by mouth every 8 (eight) hours. 04/29/23   Glade Lloyd, MD  HYDROcodone-acetaminophen (NORCO/VICODIN) 5-325 MG tablet Take 1 tablet by mouth every 6 (six) hours as needed for moderate pain (pain score 4-6). 09/21/23   Emilie Rutter, PA-C  metoprolol succinate (TOPROL-XL) 100 MG 24 hr tablet Take 1 tablet (100 mg total) by mouth daily. Take with or immediately following a meal. 04/30/23   Glade Lloyd, MD  pantoprazole (PROTONIX) 40 MG tablet Take 40 mg by mouth daily.    [provider]  potassium chloride SA (KLOR-CON M) 20 MEQ tablet Take 1 tablet (20 mEq total) by mouth daily. 07/26/23   Sherryll Burger, Pratik D, DO  sodium bicarbonate 650 MG tablet Take 650 mg by mouth 2 (two) times daily. 07/08/23   [provider]  torsemide 40 MG TABS Take 40 mg by mouth 2 (two) times daily. 07/26/23   Sherryll Burger, Pratik D, DO  TRADJENTA 5 MG TABS tablet Take 5 mg by mouth daily. 07/08/23   [provider]      Allergies    Ozempic (0.25 or 0.5 mg-dose) [semaglutide(0.25 or 0.5mg -dos)]    Review of Systems   Review of Systems  Constitutional:  Positive for fatigue. Negative for chills, diaphoresis and fever.  HENT:  Negative for congestion.   Respiratory:  Positive for cough (nild), chest tightness and shortness of breath. Negative for sputum production, wheezing and stridor.  Cardiovascular:  Positive for leg swelling (chronic and unchanged per pt). Negative for chest pain and palpitations.  Gastrointestinal:  Negative for abdominal pain, constipation, diarrhea, nausea and vomiting.  Genitourinary:  Negative for dysuria and flank pain.  Musculoskeletal:  Negative for back pain and neck pain.  Skin:  Negative for rash.  Neurological:  Negative for headaches.  Psychiatric/Behavioral:  Negative for agitation.   All other systems reviewed and are negative.   Physical Exam Updated Vital Signs Pulse 90   Temp 98.1 F (36.7 C) (Oral)   Ht 5\' 5"  (1.651 m)    Wt (!) 141.3 kg   LMP 06/22/2023 (Approximate)   SpO2 96%   BMI 51.82 kg/m  Physical Exam Vitals and nursing note reviewed.  Constitutional:      General: She is not in acute distress.    Appearance: She is well-developed. She is not ill-appearing, toxic-appearing or diaphoretic.  HENT:     Head: Normocephalic and atraumatic.     Mouth/Throat:     Mouth: Mucous membranes are moist.  Eyes:     Conjunctiva/sclera: Conjunctivae normal.     Pupils: Pupils are equal, round, and reactive to light.  Cardiovascular:     Rate and Rhythm: Normal rate and regular rhythm.     Heart sounds: No murmur heard. Pulmonary:     Effort: Pulmonary effort is normal. No respiratory distress.     Breath sounds: Rhonchi and rales present. No wheezing.  Abdominal:     Palpations: Abdomen is soft.     Tenderness: There is no abdominal tenderness.  Musculoskeletal:        General: No swelling.     Cervical back: Neck supple.     Right lower leg: Edema present.     Left lower leg: Edema present.  Skin:    General: Skin is warm and dry.     Capillary Refill: Capillary refill takes less than 2 seconds.     Findings: No erythema.  Neurological:     General: No focal deficit present.     Mental Status: She is alert.  Psychiatric:        Mood and Affect: Mood normal.     ED Results / Procedures / Treatments   Labs (all labs ordered are listed, but only abnormal results are displayed) Labs Reviewed  CBC WITH DIFFERENTIAL/PLATELET - Abnormal; Notable for the following components:      Result Value   RBC 3.35 (*)    Hemoglobin 8.5 (*)    HCT 28.8 (*)    MCH 25.4 (*)    MCHC 29.5 (*)    RDW 18.0 (*)    Neutro Abs 8.3 (*)    All other components within normal limits  COMPREHENSIVE METABOLIC PANEL WITH GFR - Abnormal; Notable for the following components:   Chloride 112 (*)    CO2 18 (*)    Glucose, Bld 136 (*)    BUN 59 (*)    Creatinine, Ser 5.80 (*)    Calcium 8.6 (*)    Total Protein 6.0  (*)    Albumin 1.8 (*)    GFR, Estimated 8 (*)    All other components within normal limits  URINALYSIS, W/ REFLEX TO CULTURE (INFECTION SUSPECTED) - Abnormal; Notable for the following components:   APPearance HAZY (*)    Glucose, UA 150 (*)    Protein, ur >=300 (*)    All other components within normal limits  TROPONIN I (HIGH SENSITIVITY) - Abnormal; Notable for  the following components:   Troponin I (High Sensitivity) 42 (*)    All other components within normal limits  TROPONIN I (HIGH SENSITIVITY) - Abnormal; Notable for the following components:   Troponin I (High Sensitivity) 33 (*)    All other components within normal limits  RESP PANEL BY RT-PCR (RSV, FLU A&B, COVID)  RVPGX2  LACTIC ACID, PLASMA  HCG, SERUM, QUALITATIVE  BRAIN NATRIURETIC PEPTIDE  LACTIC ACID, PLASMA    EKG EKG Interpretation Date/Time:  Monday September 26 2023 10:23:46 EDT Ventricular Rate:  90 PR Interval:  172 QRS Duration:  81 QT Interval:  356 QTC Calculation: 436 R Axis:   74  Text Interpretation: Sinus rhythm Low voltage, precordial leads Nonspecific T abnormalities, lateral leads when compared to prior, similar appearance. No STEMI Confirmed by Theda Belfast ([id number]) on 09/26/2023 10:42:30 AM  Radiology DG Chest Portable 1 View Result Date: 09/26/2023 CLINICAL DATA:  Peripheral edema.  Shortness of breath EXAM: PORTABLE CHEST 1 VIEW COMPARISON:  Chest radiograph dated 07/22/2023. FINDINGS: Cardiomegaly with vascular congestion and edema. No large pleural effusion. No pneumothorax. No acute osseous pathology IMPRESSION: Cardiomegaly with vascular congestion and edema. Electronically Signed   By: Elgie Collard M.D.   On: 09/26/2023 13:51    Procedures Procedures    Medications Ordered in ED Medications - No data to display  ED Course/ Medical Decision Making/ A&P                                 Medical Decision Making Amount and/or Complexity of Data Reviewed Labs:  ordered. Radiology: ordered.    Melissa [name] is a 48 y.o. female with a past medical history significant for diabetes, hypertension, GERD, CKD with recent placement of fistula, atrial fibrillation on Eliquis therapy, anxiety, hiatal hernia, and anemia who presents with exertional shortness of breath and hypoxia.  According to patient, for the last week or so she has had some shortness of breath that acutely worsened today when she was walking to her work.  She says that she was trying to walk and was so winded she could not breathe.  She could not catch her breath.  She was denying any chest pain or palpitations but EMS found her oxygen saturation to be in the 80s.  She does not take oxygen at home.  She does take diuretics and was admitted back in January for fluid overload.  She says that she has been compliant with her diuretic regimen and her leg seems similar in regards to edema to prior.  She does report some cough injury to that to pollens.  Denies any fevers or chills.  Denies sick contacts.  She denies nausea, vomiting, constipation, or diarrhea.  Denies urinary changes.  Denies fevers or chills.  Denies any pleuritic discomfort and denies history of blood clots.  On exam, lungs have rales and some rhonchi.  No wheezing for me.  Chest and abdomen were nontender.  Good pulses in extremities.  Legs are edematous bilaterally but unchanged per baseline per patient.  Patient otherwise resting now on 3 L nasal cannula to maintain oxygen saturations in the 90s.  EKG does not show STEMI.  Clinically I suspect she will likely need to be admitted due to this new hypoxia.  She thinks it feels like when she had extra fluid.  Will get screening labs, chest x-ray, and will check for COVID/flu/RSV given the mild cough.  We  will see what her renal function is and check her blood pressure.  Given her lack of pleuritic chest pain, tachycardia, or tachypnea, I have low suspicion for thromboembolic disease.  She  is on Eliquis already.  Anticipate admission after workup is completed.  Care transferred oncoming team to wait for results of BNP and reassessment.  Anticipate admission for new hypoxia.        Final Clinical Impression(s) / ED Diagnoses Final diagnoses:  Hypoxia  SOB (shortness of breath)   Clinical Impression: 1. Hypoxia   2. SOB (shortness of breath)     Disposition: Admit  This note was prepared with assistance of Dragon voice recognition software. Occasional wrong-word or sound-a-like substitutions may have occurred due to the inherent limitations of voice recognition software.      Brayen Bunn, Canary Brim, MD 09/26/23 [phone]

## 2023-09-26 NOTE — Assessment & Plan Note (Signed)
 Given patient is going to be receiving high-dose of Lasix we will hold off on home BP meds for tonight to give room for diuresis

## 2023-09-26 NOTE — ED Triage Notes (Signed)
 From work with [facility], fistula to the right arm but has not started HD as of yet

## 2023-09-26 NOTE — Assessment & Plan Note (Signed)
-   Pt diagnosed with CHF based on presence of the following:  OA, rales on exam,   Pulmonary edema on CXR, and   bilateral leg edema, DOE,    With noted response to IV diuretic in ER  admit on telemetry,  cycle cardiac enzymes, Cardiac Panel (last 3 results) Recent Labs    09/26/23 1111 09/26/23 1311 09/26/23 2034  TROPONINIHS 42* 33* 35*     obtain serial ECG  to evaluate for ischemia as a cause of heart failure  monitor daily weight:  Filed Weights   09/26/23 1022  Weight: (!) 141.3 kg   Last BNP BNP (last 3 results) Recent Labs    07/23/23 0018 09/26/23 1111  BNP 412.0* 274.5*       diurese with IV lasix   120 mg IV  q8h and monitor orthostatics and creatinine to avoid over diuresis.  Order echogram to evaluate EF and valves  ACE/ARBi  Contraindicated

## 2023-09-27 ENCOUNTER — Inpatient Hospital Stay (HOSPITAL_COMMUNITY)

## 2023-09-27 ENCOUNTER — Encounter: Payer: Self-pay | Admitting: Nephrology

## 2023-09-27 DIAGNOSIS — I5031 Acute diastolic (congestive) heart failure: Secondary | ICD-10-CM

## 2023-09-27 DIAGNOSIS — I5033 Acute on chronic diastolic (congestive) heart failure: Secondary | ICD-10-CM | POA: Diagnosis not present

## 2023-09-27 DIAGNOSIS — R0602 Shortness of breath: Secondary | ICD-10-CM | POA: Diagnosis not present

## 2023-09-27 DIAGNOSIS — R0902 Hypoxemia: Secondary | ICD-10-CM | POA: Diagnosis not present

## 2023-09-27 LAB — COMPREHENSIVE METABOLIC PANEL WITH GFR
ALT: 7 U/L (ref 0–44)
AST: 14 U/L — ABNORMAL LOW (ref 15–41)
Albumin: 1.8 g/dL — ABNORMAL LOW (ref 3.5–5.0)
Alkaline Phosphatase: 46 U/L (ref 38–126)
Anion gap: 11 (ref 5–15)
BUN: 60 mg/dL — ABNORMAL HIGH (ref 6–20)
CO2: 18 mmol/L — ABNORMAL LOW (ref 22–32)
Calcium: 8.5 mg/dL — ABNORMAL LOW (ref 8.9–10.3)
Chloride: 111 mmol/L (ref 98–111)
Creatinine, Ser: 6.18 mg/dL — ABNORMAL HIGH (ref 0.44–1.00)
GFR, Estimated: 8 mL/min — ABNORMAL LOW (ref >60)
Glucose, Bld: 119 mg/dL — ABNORMAL HIGH (ref 70–99)
Potassium: 4.5 mmol/L (ref 3.5–5.1)
Sodium: 140 mmol/L (ref 135–145)
Total Bilirubin: 0.4 mg/dL (ref 0.0–1.2)
Total Protein: 6.1 g/dL — ABNORMAL LOW (ref 6.5–8.1)

## 2023-09-27 LAB — CBC
HCT: 29.8 % — ABNORMAL LOW (ref 36.0–46.0)
HCT: 29.8 % — ABNORMAL LOW (ref 36.0–46.0)
Hemoglobin: 8.8 g/dL — ABNORMAL LOW (ref 12.0–15.0)
Hemoglobin: 8.9 g/dL — ABNORMAL LOW (ref 12.0–15.0)
MCH: 25.2 pg — ABNORMAL LOW (ref 26.0–34.0)
MCH: 25.1 pg — ABNORMAL LOW (ref 26.0–34.0)
MCHC: 29.5 g/dL — ABNORMAL LOW (ref 30.0–36.0)
MCHC: 29.9 g/dL — ABNORMAL LOW (ref 30.0–36.0)
MCV: 85.4 fL (ref 80.0–100.0)
MCV: 84.2 fL (ref 80.0–100.0)
Platelets: 236 10*3/uL (ref 150–400)
Platelets: 245 10*3/uL (ref 150–400)
RBC: 3.49 MIL/uL — ABNORMAL LOW (ref 3.87–5.11)
RBC: 3.54 MIL/uL — ABNORMAL LOW (ref 3.87–5.11)
RDW: 17.7 % — ABNORMAL HIGH (ref 11.5–15.5)
RDW: 17.5 % — ABNORMAL HIGH (ref 11.5–15.5)
WBC: 7.7 10*3/uL (ref 4.0–10.5)
WBC: 7.5 10*3/uL (ref 4.0–10.5)
nRBC: 0 % (ref 0.0–0.2)
nRBC: 0 % (ref 0.0–0.2)

## 2023-09-27 LAB — BASIC METABOLIC PANEL WITH GFR
Anion gap: 11 (ref 5–15)
BUN: 59 mg/dL — ABNORMAL HIGH (ref 6–20)
CO2: 18 mmol/L — ABNORMAL LOW (ref 22–32)
Calcium: 8.4 mg/dL — ABNORMAL LOW (ref 8.9–10.3)
Chloride: 111 mmol/L (ref 98–111)
Creatinine, Ser: 6.15 mg/dL — ABNORMAL HIGH (ref 0.44–1.00)
GFR, Estimated: 8 mL/min — ABNORMAL LOW (ref >60)
Glucose, Bld: 146 mg/dL — ABNORMAL HIGH (ref 70–99)
Potassium: 4.2 mmol/L (ref 3.5–5.1)
Sodium: 140 mmol/L (ref 135–145)

## 2023-09-27 LAB — TROPONIN I (HIGH SENSITIVITY)
Troponin I (High Sensitivity): 32 ng/L — ABNORMAL HIGH (ref <18)
Troponin I (High Sensitivity): 33 ng/L — ABNORMAL HIGH (ref <18)

## 2023-09-27 LAB — MAGNESIUM: Magnesium: 2.3 mg/dL (ref 1.7–2.4)

## 2023-09-27 LAB — GLUCOSE, CAPILLARY
Glucose-Capillary: 137 mg/dL — ABNORMAL HIGH (ref 70–99)
Glucose-Capillary: 137 mg/dL — ABNORMAL HIGH (ref 70–99)

## 2023-09-27 LAB — RETICULOCYTES
Immature Retic Fract: 31.6 % — ABNORMAL HIGH (ref 2.3–15.9)
RBC.: 3.12 MIL/uL — ABNORMAL LOW (ref 3.87–5.11)
Retic Count, Absolute: 73.9 10*3/uL (ref 19.0–186.0)
Retic Ct Pct: 2.4 % (ref 0.4–3.1)

## 2023-09-27 LAB — ECHOCARDIOGRAM LIMITED
Area-P 1/2: 2.69 cm2
Height: 65 in
S' Lateral: 2.5 cm
Weight: 4990.4 [oz_av]

## 2023-09-27 LAB — LACTIC ACID, PLASMA: Lactic Acid, Venous: 0.5 mmol/L (ref 0.5–1.9)

## 2023-09-27 LAB — FOLATE: Folate: 11.4 ng/mL (ref >5.9)

## 2023-09-27 LAB — VITAMIN B12: Vitamin B-12: 383 pg/mL (ref 180–914)

## 2023-09-27 LAB — PHOSPHORUS: Phosphorus: 6.5 mg/dL — ABNORMAL HIGH (ref 2.5–4.6)

## 2023-09-27 MED ORDER — ONDANSETRON HCL 4 MG/2ML IJ SOLN: 4.0000 mg | Freq: Four times a day (QID) | INTRAMUSCULAR | Status: DC | PRN

## 2023-09-27 MED ORDER — CALCITRIOL 0.5 MCG PO CAPS
0.5000 ug | ORAL_CAPSULE | Freq: Every day | ORAL | Status: DC
  Administered 2023-09-27 – 2023-10-02 (×6): 0.5 ug via ORAL
  Filled 2023-09-27 (×6): qty 1

## 2023-09-27 MED ORDER — ALBUTEROL SULFATE (2.5 MG/3ML) 0.083% IN NEBU: 2.5000 mg | INHALATION_SOLUTION | RESPIRATORY_TRACT | Status: DC | PRN

## 2023-09-27 MED ORDER — APIXABAN 5 MG PO TABS
5.0000 mg | ORAL_TABLET | Freq: Two times a day (BID) | ORAL | Status: DC
  Administered 2023-09-27 – 2023-10-02 (×11): 5 mg via ORAL
  Filled 2023-09-27 (×11): qty 1

## 2023-09-27 MED ORDER — PANTOPRAZOLE SODIUM 40 MG PO TBEC
40.0000 mg | DELAYED_RELEASE_TABLET | Freq: Every day | ORAL | Status: DC
  Administered 2023-09-27 – 2023-10-02 (×6): 40 mg via ORAL
  Filled 2023-09-27 (×6): qty 1

## 2023-09-27 MED ORDER — SODIUM CHLORIDE 0.9% FLUSH: 3.0000 mL | INTRAVENOUS | Status: DC | PRN

## 2023-09-27 MED ORDER — SODIUM CHLORIDE 0.9 % IV SOLN: 250.0000 mL | INTRAVENOUS | Status: AC | PRN

## 2023-09-27 MED ORDER — ACETAMINOPHEN 325 MG PO TABS
650.0000 mg | ORAL_TABLET | Freq: Four times a day (QID) | ORAL | Status: DC | PRN
  Administered 2023-09-28: 650 mg via ORAL
  Filled 2023-09-27: qty 2

## 2023-09-27 MED ORDER — CITALOPRAM HYDROBROMIDE 20 MG PO TABS
30.0000 mg | ORAL_TABLET | Freq: Every day | ORAL | Status: DC
  Administered 2023-09-27 – 2023-10-02 (×6): 30 mg via ORAL
  Filled 2023-09-27 (×6): qty 1

## 2023-09-27 MED ORDER — ATORVASTATIN CALCIUM 80 MG PO TABS
80.0000 mg | ORAL_TABLET | Freq: Every day | ORAL | Status: DC
  Administered 2023-09-27 – 2023-10-02 (×6): 80 mg via ORAL
  Filled 2023-09-27 (×6): qty 1

## 2023-09-27 MED ORDER — FERROUS SULFATE 325 (65 FE) MG PO TABS
325.0000 mg | ORAL_TABLET | Freq: Every day | ORAL | Status: DC
  Administered 2023-09-27 – 2023-10-02 (×6): 325 mg via ORAL
  Filled 2023-09-27 (×6): qty 1

## 2023-09-27 MED ORDER — TORSEMIDE 20 MG PO TABS
80.0000 mg | ORAL_TABLET | Freq: Two times a day (BID) | ORAL | Status: DC
  Administered 2023-09-28: 80 mg via ORAL
  Filled 2023-09-27: qty 4

## 2023-09-27 MED ORDER — SODIUM CHLORIDE 0.9% FLUSH
3.0000 mL | Freq: Two times a day (BID) | INTRAVENOUS | Status: DC
  Administered 2023-09-27 – 2023-10-02 (×9): 3 mL via INTRAVENOUS

## 2023-09-27 MED ORDER — ACETAMINOPHEN 650 MG RE SUPP
650.0000 mg | Freq: Four times a day (QID) | RECTAL | Status: DC | PRN
Start: 2023-09-27 — End: 2023-10-02

## 2023-09-27 MED ORDER — METOPROLOL SUCCINATE ER 100 MG PO TB24
100.0000 mg | ORAL_TABLET | Freq: Every day | ORAL | Status: DC
  Administered 2023-09-27 – 2023-10-02 (×6): 100 mg via ORAL
  Filled 2023-09-27 (×6): qty 1

## 2023-09-27 MED ORDER — ONDANSETRON HCL 4 MG PO TABS: 4.0000 mg | ORAL_TABLET | Freq: Four times a day (QID) | ORAL | Status: DC | PRN

## 2023-09-27 MED ORDER — HYDROCODONE-ACETAMINOPHEN 5-325 MG PO TABS: 1.0000 | ORAL_TABLET | ORAL | Status: DC | PRN

## 2023-09-27 NOTE — Consult Note (Signed)
 [facility]  INPATIENT CONSULTATION  Reason for Consultation: AKI on CKD Requesting Provider: Dr. Isidoro Donning  HPI: Melissa [name] is an 48 y.o. female with DM, A fib, GERD, HTN, obesity, CKD 5, HFpEF currently admitted with hypervolemia and nephrology is consulted for assistance in management of CKD and diuresis.    Presented to the ED yesterday with DOE.  Found to be hypoxic and started on 3L [place]. Afebrile, HTN 130-160s. CXR with vasc congestion and edema. She'd been compliant with torsemide 40 BID but admitted to high salt church meal Sun.  She's rec'd lasix 60 IV x 2 last PM and 120mg  IV this AM and has 1.2L UOP recorded. She currently feels a bit improved and notes she needs to urinate again.  She's no been ambulatory in the ED so she cannot assess if DOE is improved.  She denies N/V, dysgeusia, hiccups, confusion.    Baseline Cr near 5 recently, was 6.1 on 3/26 and 5.8 yesterday; labs for today pending.  She had an AVF placed 09/21/23 Dr. Lenell Antu.  Denies s/s steal.  PMH: Past Medical History:  Diagnosis Date   Anemia    Anxiety    Chronic kidney disease    Complication of anesthesia    Diabetes mellitus without complication (HCC)    Dysrhythmia 01/2019   PAF   GERD (gastroesophageal reflux disease)    History of hiatal hernia    Hypertension    PONV (postoperative nausea and vomiting)    PSH: Past Surgical History:  Procedure Laterality Date   AV FISTULA PLACEMENT Right 09/21/2023   Procedure: Right Brachiocephalic ARTERIOVENOUS (AV) FISTULA CREATION;  Surgeon: Leonie Douglas, MD;  Location: [facility] OR;  Service: Vascular;  Laterality: Right;   BIOPSY  02/02/2022   Procedure: BIOPSY;  Surgeon: Dolores Frame, MD;  Location: [facility];  Service: Gastroenterology;;   ESOPHAGOGASTRODUODENOSCOPY (EGD) WITH PROPOFOL N/A 02/02/2022   Procedure: ESOPHAGOGASTRODUODENOSCOPY (EGD) WITH PROPOFOL;  Surgeon: Dolores Frame, MD;  Location: [facility];  Service:  Gastroenterology;  Laterality: N/A;  240   NO PAST SURGERIES      Past Medical History:  Diagnosis Date   Anemia    Anxiety    Chronic kidney disease    Complication of anesthesia    Diabetes mellitus without complication (HCC)    Dysrhythmia 01/2019   PAF   GERD (gastroesophageal reflux disease)    History of hiatal hernia    Hypertension    PONV (postoperative nausea and vomiting)     Medications:  I have reviewed the patient's current medications.  (Not in a hospital admission)   ALLERGIES:   Allergies  Allergen Reactions   Ozempic (0.25 Or 0.5 Mg-Dose) [Semaglutide(0.25 Or 0.5mg -Dos)] Nausea And Vomiting    FAM HX: History reviewed. No pertinent family history.  Social History:   reports that she has quit smoking. Her smoking use included cigarettes. She has been exposed to tobacco smoke. She has never used smokeless tobacco. She reports that she does not currently use alcohol. She reports that she does not use drugs.  ROS: 12 system ROS neg except per HPI above  Blood pressure (!) 150/91, pulse 74, temperature 97.9 F (36.6 C), temperature source Oral, resp. rate 14, height 5\' 5"  (1.651 m), weight (!) 141.3 kg, last menstrual period 06/22/2023, SpO2 100%, unknown if currently breastfeeding. PHYSICAL EXAM: Gen: obese woman comfortable on stretcher  Eyes: anicteric ENT: MMM Neck: thick CV:  RRR no rub Abd: soft, nontender Back: distant  BS, no frank rales GU: no foley Extr:  no LE edema, R BC AVF +t/b, incision looks clean and healing Neuro: no asterixis, AOx3   Results for orders placed or performed during the hospital encounter of 09/26/23 (from the past 48 hours)  Brain natriuretic peptide     Status: Abnormal   Collection Time: 09/26/23 11:11 AM  Result Value Ref Range   B Natriuretic Peptide 274.5 (H) 0.0 - 100.0 pg/mL    Comment: Performed at [facility], [address] [place], [place] [address]  CBC with Differential     Status: Abnormal    Collection Time: 09/26/23 11:11 AM  Result Value Ref Range   WBC 10.3 4.0 - 10.5 K/uL   RBC 3.35 (L) 3.87 - 5.11 MIL/uL   Hemoglobin 8.5 (L) 12.0 - 15.0 g/dL   HCT 62.1 (L) 30.8 - 65.7 %   MCV 86.0 80.0 - 100.0 fL   MCH 25.4 (L) 26.0 - 34.0 pg   MCHC 29.5 (L) 30.0 - 36.0 g/dL   RDW 84.6 (H) 96.2 - 95.2 %   Platelets 218 150 - 400 K/uL   nRBC 0.0 0.0 - 0.2 %   Neutrophils Relative % 80 %   Neutro Abs 8.3 (H) 1.7 - 7.7 K/uL   Lymphocytes Relative 8 %   Lymphs Abs 0.8 0.7 - 4.0 K/uL   Monocytes Relative 7 %   Monocytes Absolute 0.8 0.1 - 1.0 K/uL   Eosinophils Relative 4 %   Eosinophils Absolute 0.4 0.0 - 0.5 K/uL   Basophils Relative 0 %   Basophils Absolute 0.0 0.0 - 0.1 K/uL   Immature Granulocytes 1 %   Abs Immature Granulocytes 0.05 0.00 - 0.07 K/uL    Comment: Performed at [facility], [address], [place], [place] [address]  Comprehensive metabolic panel     Status: Abnormal   Collection Time: 09/26/23 11:11 AM  Result Value Ref Range   Sodium 140 135 - 145 mmol/L   Potassium 4.5 3.5 - 5.1 mmol/L   Chloride 112 (H) 98 - 111 mmol/L   CO2 18 (L) 22 - 32 mmol/L   Glucose, Bld 136 (H) 70 - 99 mg/dL    Comment: Glucose reference range applies only to samples taken after fasting for at least 8 hours.   BUN 59 (H) 6 - 20 mg/dL   Creatinine, Ser 4.40 (H) 0.44 - 1.00 mg/dL   Calcium 8.6 (L) 8.9 - 10.3 mg/dL   Total Protein 6.0 (L) 6.5 - 8.1 g/dL   Albumin 1.8 (L) 3.5 - 5.0 g/dL   AST 15 15 - 41 U/L   ALT 7 0 - 44 U/L   Alkaline Phosphatase 46 38 - 126 U/L   Total Bilirubin 0.6 0.0 - 1.2 mg/dL   GFR, Estimated 8 (L) >60 mL/min    Comment: (NOTE) Calculated using the CKD-EPI Creatinine Equation (2021)    Anion gap 10 5 - 15    Comment: Performed at [facility], [address], [place], [place] [address]  Troponin I (High Sensitivity)     Status: Abnormal   Collection Time: 09/26/23 11:11 AM  Result Value Ref Range   Troponin I (High Sensitivity) 42 (H)  <18 ng/L    Comment: (NOTE) Elevated high sensitivity troponin I (hsTnI) values and significant  changes across serial measurements may suggest ACS but many other  chronic and acute conditions are known to elevate hsTnI results.  Refer to the "Links"  section for chest pain algorithms and additional  guidance. Performed at [facility], [address], [place], [place] [address]   Resp panel by RT-PCR (RSV, Flu A&B, Covid) Anterior Nasal Swab     Status: None   Collection Time: 09/26/23 11:40 AM   Specimen: Anterior Nasal Swab  Result Value Ref Range   SARS Coronavirus 2 by RT PCR NEGATIVE NEGATIVE   Influenza A by PCR NEGATIVE NEGATIVE   Influenza B by PCR NEGATIVE NEGATIVE    Comment: (NOTE) The Xpert Xpress SARS-CoV-2/FLU/RSV plus assay is intended as an aid in the diagnosis of influenza from Nasopharyngeal swab specimens and should not be used as a sole basis for treatment. Nasal washings and aspirates are unacceptable for Xpert Xpress SARS-CoV-2/FLU/RSV testing.  Fact Sheet for Patients: BloggerCourse.com  Fact Sheet for Healthcare Providers: SeriousBroker.it  This test is not yet approved or cleared by the [place] FDA and has been authorized for detection and/or diagnosis of SARS-CoV-2 by FDA under an Emergency Use Authorization (EUA). This EUA will remain in effect (meaning this test can be used) for the duration of the COVID-19 declaration under Section 564(b)(1) of the Act, 21 U.S.C. section 360bbb-3(b)(1), unless the authorization is terminated or revoked.     Resp Syncytial Virus by PCR NEGATIVE NEGATIVE    Comment: (NOTE) Fact Sheet for Patients: BloggerCourse.com  Fact Sheet for Healthcare Providers: SeriousBroker.it  This test is not yet approved or cleared by the [place] FDA and has been authorized for detection and/or diagnosis of  SARS-CoV-2 by FDA under an Emergency Use Authorization (EUA). This EUA will remain in effect (meaning this test can be used) for the duration of the COVID-19 declaration under Section 564(b)(1) of the Act, 21 U.S.C. section 360bbb-3(b)(1), unless the authorization is terminated or revoked.  Performed at [facility], [address] [place], [place] [address]   Troponin I (High Sensitivity)     Status: Abnormal   Collection Time: 09/26/23  1:11 PM  Result Value Ref Range   Troponin I (High Sensitivity) 33 (H) <18 ng/L    Comment: (NOTE) Elevated high sensitivity troponin I (hsTnI) values and significant  changes across serial measurements may suggest ACS but many other  chronic and acute conditions are known to elevate hsTnI results.  Refer to the "Links" section for chest pain algorithms and additional  guidance. Performed at [facility], [address] [place], [place] [address]   Lactic acid, plasma     Status: None   Collection Time: 09/26/23  1:12 PM  Result Value Ref Range   Lactic Acid, Venous 0.5 0.5 - 1.9 mmol/L    Comment: Performed at [facility], [address], [place], [place] [address]  Urinalysis, w/ Reflex to Culture (Infection Suspected) -Urine, Clean Catch     Status: Abnormal   Collection Time: 09/26/23  2:00 PM  Result Value Ref Range   Specimen Source URINE, CLEAN CATCH    Color, Urine YELLOW YELLOW   APPearance HAZY (A) CLEAR   Specific Gravity, Urine 1.012 1.005 - 1.030   pH 6.0 5.0 - 8.0   Glucose, UA 150 (A) NEGATIVE mg/dL   Hgb urine dipstick NEGATIVE NEGATIVE   Bilirubin Urine NEGATIVE NEGATIVE   Ketones, ur NEGATIVE NEGATIVE mg/dL   Protein, ur >=295 (A) NEGATIVE mg/dL   Nitrite NEGATIVE NEGATIVE   Leukocytes,Ua NEGATIVE NEGATIVE   RBC / HPF 0-5 0 - 5 RBC/hpf   WBC, UA 0-5  0 - 5 WBC/hpf    Comment:        Reflex urine culture not performed if WBC <=10, OR if Squamous epithelial cells >5. If Squamous epithelial cells  >5 suggest recollection.    Bacteria, UA NONE SEEN NONE SEEN   Squamous Epithelial / HPF 6-10 0 - 5 /HPF   Mucus PRESENT     Comment: Performed at [facility], [address] [place], [place] [address]  hCG, serum, qualitative     Status: None   Collection Time: 09/26/23  3:37 PM  Result Value Ref Range   Preg, Serum NEGATIVE NEGATIVE    Comment:        THE SENSITIVITY OF THIS METHODOLOGY IS >10 mIU/mL. Performed at [facility], [address], [place], [place] [address]   CBG monitoring, ED     Status: None   Collection Time: 09/26/23  7:24 PM  Result Value Ref Range   Glucose-Capillary 72 70 - 99 mg/dL    Comment: Glucose reference range applies only to samples taken after fasting for at least 8 hours.  Blood gas, venous     Status: Abnormal   Collection Time: 09/26/23  8:34 PM  Result Value Ref Range   pH, Ven 7.29 7.25 - 7.43   pCO2, Ven 42 (L) 44 - 60 mmHg   pO2, Ven 50 (H) 32 - 45 mmHg   Bicarbonate 20.2 20.0 - 28.0 mmol/L   Acid-base deficit 6.1 (H) 0.0 - 2.0 mmol/L   O2 Saturation 79.6 %   Patient temperature 37.0     Comment: Performed at [facility], [address] [place], [place] [address]  Phosphorus     Status: Abnormal   Collection Time: 09/26/23  8:34 PM  Result Value Ref Range   Phosphorus 6.8 (H) 2.5 - 4.6 mg/dL    Comment: Performed at [facility], [address] [place], [place] [address]  Magnesium     Status: None   Collection Time: 09/26/23  8:34 PM  Result Value Ref Range   Magnesium 2.2 1.7 - 2.4 mg/dL    Comment: Performed at [facility], [address], [place], [place] [address]  TSH     Status: None   Collection Time: 09/26/23  8:34 PM  Result Value Ref Range   TSH 2.713 0.350 - 4.500 uIU/mL    Comment: Performed by a 3rd Generation assay with a functional sensitivity of <=0.01 uIU/mL. Performed at [facility], [address] [place], [place] [address]   Troponin I (High Sensitivity)      Status: Abnormal   Collection Time: 09/26/23  8:34 PM  Result Value Ref Range   Troponin I (High Sensitivity) 35 (H) <18 ng/L    Comment: (NOTE) Elevated high sensitivity troponin I (hsTnI) values and significant  changes across serial measurements may suggest ACS but many other  chronic and acute conditions are known to elevate hsTnI results.  Refer to the "Links" section for chest pain algorithms and additional  guidance. Performed at [facility], [address] [place], [place] [address]   Iron and TIBC     Status: Abnormal   Collection Time: 09/26/23  8:34 PM  Result Value Ref Range   Iron 40 28 - 170 ug/dL   TIBC 962 (L) 952 - 841 ug/dL   Saturation Ratios 17 10.4 - 31.8 %   UIBC 202 ug/dL    Comment: Performed  at [facility], [address] [place] [address], [place], [place] [address]  Ferritin     Status: None   Collection Time: 09/26/23  8:34 PM  Result Value Ref Range   Ferritin 85 11 - 307 ng/mL    Comment: Performed at [facility], [address], [place], [place] [address]  Troponin I (High Sensitivity)     Status: Abnormal   Collection Time: 09/27/23 12:15 AM  Result Value Ref Range   Troponin I (High Sensitivity) 32 (H) <18 ng/L    Comment: (NOTE) Elevated high sensitivity troponin I (hsTnI) values and significant  changes across serial measurements may suggest ACS but many other  chronic and acute conditions are known to elevate hsTnI results.  Refer to the "Links" section for chest pain algorithms and additional  guidance. Performed at [facility], [address], [place], [place] [address]   Lactic acid, plasma     Status: None   Collection Time: 09/27/23 12:37 AM  Result Value Ref Range   Lactic Acid, Venous 0.5 0.5 - 1.9 mmol/L    Comment: Performed at [facility], [address] [place], [place] [address]  Vitamin B12     Status: None   Collection Time: 09/27/23 12:37 AM  Result Value Ref Range   Vitamin B-12 383 180 - 914 pg/mL     Comment: (NOTE) This assay is not validated for testing neonatal or myeloproliferative syndrome specimens for Vitamin B12 levels. Performed at [facility], [address], [place], [place] [address]   Folate     Status: None   Collection Time: 09/27/23 12:37 AM  Result Value Ref Range   Folate 11.4 >5.9 ng/mL    Comment: Performed at [facility], [address], [place], [place] [address]  Reticulocytes     Status: Abnormal   Collection Time: 09/27/23 12:37 AM  Result Value Ref Range   Retic Ct Pct 2.4 0.4 - 3.1 %   RBC. 3.12 (L) 3.87 - 5.11 MIL/uL   Retic Count, Absolute 73.9 19.0 - 186.0 K/uL   Immature Retic Fract 31.6 (H) 2.3 - 15.9 %    Comment: Performed at [facility], [address], [place], [place] [address]  Troponin I (High Sensitivity)     Status: Abnormal   Collection Time: 09/27/23  5:34 AM  Result Value Ref Range   Troponin I (High Sensitivity) 33 (H) <18 ng/L    Comment: (NOTE) Elevated high sensitivity troponin I (hsTnI) values and significant  changes across serial measurements may suggest ACS but many other  chronic and acute conditions are known to elevate hsTnI results.  Refer to the "Links" section for chest pain algorithms and additional  guidance. Performed at [facility], [address], [place], [place] [address]     DG Chest Portable 1 View Result Date: 09/26/2023 CLINICAL DATA:  Peripheral edema.  Shortness of breath EXAM: PORTABLE CHEST 1 VIEW COMPARISON:  Chest radiograph dated 07/22/2023. FINDINGS: Cardiomegaly with vascular congestion and edema. No large pleural effusion. No pneumothorax. No acute osseous pathology IMPRESSION: Cardiomegaly with vascular congestion and edema. Electronically Signed   By: Elgie Collard M.D.   On: 09/26/2023 13:51    Assessment/PlanTyneika S Fulginiti is an 48 y.o. female with DM, A fib, GERD, HTN, obesity, CKD 5, HFpEF currently admitted with hypervolemia and nephrology is consulted for  assistance in management of CKD and diuresis.    **AHRF in  setting of A/C HFpEF:  Being diuresed, cont IV for now for 2 more doses.  Will need change to outpt diuretic regimen outpt at d/c - was taking torsemide 40 BID will tentatively increase to 80 BID starting tomorrow AM.  Low na/fluid restricted diet.  Strict I/Os.   **CKD V: Baseline advanced CKD making plans for HD.  F/b Dr. Thedore Mins outpt. No current indications for RRT but will need to be followed closely.    **Anemia:  Hb 8.5 stable c/w recent priors.  CTM.   **Secondary hyperparathyroidism:  phos 6.8, Corr Ca ok.  PTH 08/2023 185.  Renal diet.   **HTN: resume home meds, follow with diuresis.   *A fib: on eliquis and BB.   **Obesity: BMI 51.  Will follow, reach out w concerns.  If she's clinically doing better tomorrow she likely can d/c.  Tyler Pita 09/27/2023, 11:24 AM

## 2023-09-27 NOTE — Plan of Care (Signed)
?  Problem: Education: ?Goal: Ability to demonstrate management of disease process will improve ?Outcome: Progressing ?  ?Problem: Activity: ?Goal: Capacity to carry out activities will improve ?Outcome: Progressing ?  ?Problem: Cardiac: ?Goal: Ability to achieve and maintain adequate cardiopulmonary perfusion will improve ?Outcome: Progressing ?  ?

## 2023-09-27 NOTE — Progress Notes (Signed)
 [facility]                                                                              [place], is a 48 y.o. female, DOB - 12-26-1975, ZOX:[id number] Admit date - 09/26/2023    Outpatient Primary MD for the patient is Bucio, Julian Reil, FNP  LOS - 1  days  Chief Complaint  Patient presents with   Shortness of Breath       Brief summary   Patient is a 48 year old female with CKD stage V s/p right arm fistula, not on hemodialysis yet,  HTN, hyperlipidemia, A-fib on Eliquis, obesity presented to ED with shortness of breath, worse with exertion or ambulation, was found to be hypoxic in ED.  No chest pain, diaphoresis, fevers or chills.  She also reported leg swelling.  She felt that last week she had some shortness of breath that acutely worsened on the day of admission when she was walking to her work, could not catch her breath and felt winded. She was admitted in January for the fluid overload, states has been compliant with torsemide.  In ED, creatinine 5.8 BNP 274.5, troponins 42-33-35-32-33  Assessment & Plan    Principal Problem:   Acute on chronic diastolic CHF (congestive heart failure) (HCC) in the setting of CKD stage V, volume overload -Presented with significant shortness of breath, DOE, pulmonary edema on chest x-ray, BNP 274 -Started on high-dose IV Lasix 120 mg every 8 hours -Strict I's and O's and daily weights -2D echo 06/2023 had shown EF of 55 to 60%, G1 DD -Nephrology consulted  Active Problems:    CKD (chronic kidney disease) stage 5, GFR less than 15 ml/min (HCC) with volume overload -Continue IV Lasix for diuresis, nephrology consulted    Atrial fibrillation, chronic (HCC) -Continue Toprol, Eliquis, rate controlled     Anemia of chronic renal failure, normocytic anemia -Baseline hemoglobin 7.7-9 -Anemia panel showed iron 40, TIBC 242, percent saturation show 17, ferritin 85, folate 11.4, B12 383 -H&H currently stable     Acute  respiratory failure with hypoxia (HCC) -O2 sats in 90s on 2 L via [place], wean O2 as tolerated    Essential hypertension -Currently on high-dose IV Lasix for diuresis   Morbid obesity, BMI of 50.0-59.9, adult (HCC) Estimated body mass index is 51.82 kg/m as calculated from the following:   Height as of this encounter: 5\' 5"  (1.651 m).   Weight as of this encounter: 141.3 kg.  Code Status: Full CODE STATUS DVT Prophylaxis:     Level of Care: Level of care: Progressive Family Communication: Updated patient Disposition Plan:      Remains inpatient appropriate:      Procedures:    Consultants:   Nephrology  Antimicrobials:   Anti-infectives (From admission, onward)    None          Medications     Subjective:   Melissa [name] was seen and examined today.  No significant improvement but also has not ambulated yet.  Was short of breath and winded on walking to her work yesterday.  Patient denies dizziness, chest pain,  abdominal pain,  N/V/D.   Objective:   Vitals:   09/27/23 0537 09/27/23 0600 09/27/23 0910 09/27/23 1000  BP:  (!) 162/87 (!) 150/91   Pulse:  86 74   Resp:  19 14   Temp: 97.8 F (36.6 C)   97.9 F (36.6 C)  TempSrc: Oral   Oral  SpO2:  97% 100%   Weight:      Height:        Intake/Output Summary (Last 24 hours) at 09/27/2023 1054 Last data filed at 09/27/2023 0548 Gross per 24 hour  Intake --  Output 1200 ml  Net -1200 ml     Wt Readings from Last 3 Encounters:  09/26/23 (!) 141.3 kg  09/21/23 (!) 138.3 kg  07/26/23 (!) 139.8 kg     Exam General: Alert and oriented x 3, NAD Cardiovascular: S1 S2 auscultated,  RRR Respiratory: Bibasilar Rales Gastrointestinal: Soft, nontender, nondistended, + bowel sounds Ext: + pedal edema bilaterally Neuro: no new deficits Psych: Normal affect     Data Reviewed:  I have personally reviewed following labs    CBC Lab Results  Component Value Date   WBC 10.3 09/26/2023   RBC 3.12 (L)  09/27/2023   HGB 8.5 (L) 09/26/2023   HCT 28.8 (L) 09/26/2023   MCV 86.0 09/26/2023   MCH 25.4 (L) 09/26/2023   PLT 218 09/26/2023   MCHC 29.5 (L) 09/26/2023   RDW 18.0 (H) 09/26/2023   LYMPHSABS 0.8 09/26/2023   MONOABS 0.8 09/26/2023   EOSABS 0.4 09/26/2023   BASOSABS 0.0 09/26/2023     Last metabolic panel Lab Results  Component Value Date   NA 140 09/26/2023   K 4.5 09/26/2023   CL 112 (H) 09/26/2023   CO2 18 (L) 09/26/2023   BUN 59 (H) 09/26/2023   CREATININE 5.80 (H) 09/26/2023   GLUCOSE 136 (H) 09/26/2023   GFRNONAA 8 (L) 09/26/2023   CALCIUM 8.6 (L) 09/26/2023   PHOS 6.8 (H) 09/26/2023   PROT 6.0 (L) 09/26/2023   ALBUMIN 1.8 (L) 09/26/2023   LABGLOB 2.9 04/20/2023   BILITOT 0.6 09/26/2023   ALKPHOS 46 09/26/2023   AST 15 09/26/2023   ALT 7 09/26/2023   ANIONGAP 10 09/26/2023    CBG (last 3)  Recent Labs    09/26/23 1924  GLUCAP 72      Coagulation Profile: No results for input(s): "INR", "PROTIME" in the last 168 hours.   Radiology Studies: I have personally reviewed the imaging studies  DG Chest Portable 1 View Result Date: 09/26/2023 CLINICAL DATA:  Peripheral edema.  Shortness of breath EXAM: PORTABLE CHEST 1 VIEW COMPARISON:  Chest radiograph dated 07/22/2023. FINDINGS: Cardiomegaly with vascular congestion and edema. No large pleural effusion. No pneumothorax. No acute osseous pathology IMPRESSION: Cardiomegaly with vascular congestion and edema. Electronically Signed   By: Elgie Collard M.D.   On: 09/26/2023 13:51       Dionte Blaustein M.D. [facility] 09/27/2023, 10:54 AM  Available via Epic secure chat 7am-7pm After 7 pm, please refer to night coverage provider listed on amion.

## 2023-09-27 NOTE — Progress Notes (Signed)
  Echocardiogram 2D Echocardiogram has been performed.  Melissa [name] 09/27/2023, 2:17 PM

## 2023-09-28 DIAGNOSIS — I482 Chronic atrial fibrillation, unspecified: Secondary | ICD-10-CM | POA: Diagnosis not present

## 2023-09-28 DIAGNOSIS — I5033 Acute on chronic diastolic (congestive) heart failure: Secondary | ICD-10-CM | POA: Diagnosis not present

## 2023-09-28 DIAGNOSIS — J9601 Acute respiratory failure with hypoxia: Secondary | ICD-10-CM | POA: Diagnosis not present

## 2023-09-28 DIAGNOSIS — N189 Chronic kidney disease, unspecified: Secondary | ICD-10-CM | POA: Diagnosis not present

## 2023-09-28 LAB — GLUCOSE, CAPILLARY
Glucose-Capillary: 90 mg/dL (ref 70–99)
Glucose-Capillary: 109 mg/dL — ABNORMAL HIGH (ref 70–99)
Glucose-Capillary: 103 mg/dL — ABNORMAL HIGH (ref 70–99)

## 2023-09-28 LAB — RENAL FUNCTION PANEL
Albumin: 1.7 g/dL — ABNORMAL LOW (ref 3.5–5.0)
Anion gap: 10 (ref 5–15)
BUN: 63 mg/dL — ABNORMAL HIGH (ref 6–20)
CO2: 18 mmol/L — ABNORMAL LOW (ref 22–32)
Calcium: 8.3 mg/dL — ABNORMAL LOW (ref 8.9–10.3)
Chloride: 110 mmol/L (ref 98–111)
Creatinine, Ser: 6.23 mg/dL — ABNORMAL HIGH (ref 0.44–1.00)
GFR, Estimated: 8 mL/min — ABNORMAL LOW (ref >60)
Glucose, Bld: 96 mg/dL (ref 70–99)
Phosphorus: 7.2 mg/dL — ABNORMAL HIGH (ref 2.5–4.6)
Potassium: 4.2 mmol/L (ref 3.5–5.1)
Sodium: 138 mmol/L (ref 135–145)

## 2023-09-28 LAB — CBC
HCT: 27.8 % — ABNORMAL LOW (ref 36.0–46.0)
Hemoglobin: 8.5 g/dL — ABNORMAL LOW (ref 12.0–15.0)
MCH: 25.3 pg — ABNORMAL LOW (ref 26.0–34.0)
MCHC: 30.6 g/dL (ref 30.0–36.0)
MCV: 82.7 fL (ref 80.0–100.0)
Platelets: 237 10*3/uL (ref 150–400)
RBC: 3.36 MIL/uL — ABNORMAL LOW (ref 3.87–5.11)
RDW: 17.4 % — ABNORMAL HIGH (ref 11.5–15.5)
WBC: 6 10*3/uL (ref 4.0–10.5)
nRBC: 0 % (ref 0.0–0.2)

## 2023-09-28 MED ORDER — IRON SUCROSE 200 MG IVPB - SIMPLE MED
200.0000 mg | Status: DC
  Administered 2023-09-28: 200 mg via INTRAVENOUS
  Filled 2023-09-28: qty 200
  Filled 2023-09-28: qty 110

## 2023-09-28 MED ORDER — FUROSEMIDE 10 MG/ML IJ SOLN
160.0000 mg | Freq: Four times a day (QID) | INTRAVENOUS | Status: AC
  Administered 2023-09-28 (×2): 160 mg via INTRAVENOUS
  Filled 2023-09-28 (×2): qty 10

## 2023-09-28 MED ORDER — LORATADINE 10 MG PO TABS
10.0000 mg | ORAL_TABLET | Freq: Every day | ORAL | Status: DC
  Administered 2023-09-28 – 2023-10-02 (×5): 10 mg via ORAL
  Filled 2023-09-28 (×5): qty 1

## 2023-09-28 NOTE — Progress Notes (Signed)
 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  Admitted for Acute on Chronic Diastolic CHF in the setting of CKD Stage 5 and Nephrology consulted and diuretics are being increased.  Assessment and Plan:  Acute on Chronic Diastolic CHF (congestive heart failure) (HCC) in the setting of CKD stage V, volume overload -Presented with significant shortness of breath, DOE, pulmonary edema on chest x-ray, BNP 274 -Started on high-dose IV Lasix 120 mg every 8 hours but did not have a great response so nephrology is now increasing his to 160 mg twice daily x 2 Doses  -Strict I's and O's and daily weights -2D echo 06/2023 had shown EF of 55 to 60%, G1 DD -Appreciate Nephrology Recc's   CKD (chronic kidney disease) stage 5, GFR less than 15 ml/min (HCC) with volume overload -BUN/Cr Trend: Recent Labs  Lab 09/21/23 0759 09/26/23 1111 09/27/23 1126 09/27/23 1437 09/28/23 0516  BUN 51* 59* 59* 60* 63*  CREATININE 6.10* 5.80* 6.15* 6.18* 6.23*  -C/w IV Lasix for Diuresis and Nephrology Consulted -C/w Calcitriol 0.5 mcg po Daily -Avoid Nephrotoxic Medications, Contrast Dyes, Hypotension and Dehydration to Ensure Adequate Renal Perfusion and will need to Renally Adjust Meds -Continue to Monitor and Trend Renal Function carefully and repeat CMP in the AM   Atrial Fibrillation, chronic (HCC): Continue Metoprolol Succinate 100 mg po daily and A/C with Apixaban 5 mg po BID. Rate controlled. CTM and Trend on Telemetry   Acute Respiratory Failure with Hypoxia (HCC): O2 sats in 90s on 2 L via [place], wean O2 as tolerated. SpO2: 98 % O2 Flow Rate (L/min): 2 L/min   Essential HTN:  Currently on high-dose IV Lasix for diuresis. CTM BP per Protocol. Last BP reading was 155/83  HLD: C/w Atorvastatin 80 mg po Daily  Depression and Anxiety: C/w Citalopram 30 mg po Daily  Normocytic Anemia/Anemia of Chronic Kidney Disease: B/L is around 7-7.9. Hgb/Hct went from 8.8/29.8 -> 8.9/29.8 -> 8.5/27.8. Anemia panel showed iron 40, TIBC 242, percent saturation show 17, ferritin 85, folate 11.4, B12 383. Getting IV Iron per Nephrology and continuing po Ferrous Sulfate tablet 325 mg po Daily. CTM for S/Sx of Bleeding; No overt bleeding noted. Repeat CBC in the AM   GERD/GI Prophylaxis: C/w Pantoprazole 40 mg po Daily  Hypoalbuminemia: Patient's Albumin now 1.7. CTM and Trend and repeat CMP in the AM  Class III (Super Morbid) Obesity: Complicates overall prognosis and care. Estimated body mass index is 51.55 kg/m as calculated from the following:   Height as of this encounter: 5\' 5"  (1.651 m).   Weight as of this encounter: 140.5 kg. Weight Loss and Dietary Counseling given   DVT prophylaxis: SCDs Start: 09/27/23 1319 apixaban (ELIQUIS) tablet 5 mg    Code Status: Full Code Family Communication: No family present at bedside  Disposition Plan:  Level of care: Progressive Status is: Inpatient Remains inpatient appropriate because: Being continued on Diuresis   Consultants:  Nephrology  Procedures:  As delineated as above  Antimicrobials:  Anti-infectives (From admission, onward)    None       Subjective: Seen and examined at bedside and thinks that she is  doing okay and continues to still feel short of breath especially when she ambulates.  Thinks she is not urinating as much as she should be.  Denied lightheadedness or dizziness.  Feels okay.  No other concerns or complaint this time.  Objective: Vitals:   09/27/23 1643 09/27/23 2047 09/28/23 0523 09/28/23 1417  BP: (!) 148/80 (!) 154/92 (!) 171/75 (!) 155/83  Pulse: 81 81 77 77  Resp: 16 20 18 18   Temp: 98.3 F  (36.8 C) 98.6 F (37 C) (!) 97.5 F (36.4 C) 97.9 F (36.6 C)  TempSrc: Oral Oral Oral Oral  SpO2: 93% 93% 93% 98%  Weight:   (!) 140.5 kg   Height:        Intake/Output Summary (Last 24 hours) at 09/28/2023 1839 Last data filed at 09/28/2023 1111 Gross per 24 hour  Intake 840 ml  Output 2560 ml  Net -1720 ml   Filed Weights   09/26/23 1022 09/27/23 1307 09/28/23 0523  Weight: (!) 141.3 kg (!) 141.5 kg (!) 140.5 kg   Examination: Physical Exam:  Constitutional: WN/WD super morbidly obese African-American female no acute distress Respiratory: Diminished to auscultation bilaterally with some coarse breath sounds but no appreciable wheezing or rales.  Has some slight crackles noted.. Normal respiratory effort and patient is not tachypenic. No accessory muscle use.  Unlabored breathing but is wearing supplemental oxygen via nasal cannula Cardiovascular: RRR, no murmurs / rubs / gallops. S1 and S2 auscultated.  Has some pedal edema noted bilaterally Abdomen: Soft, non-tender, distended secondary to body habitus. Bowel sounds positive.  GU: Deferred. Musculoskeletal: No clubbing / cyanosis of digits/nails. No joint deformity upper and lower extremities. Good ROM, no contractures. Normal strength and muscle tone.  Skin: No rashes, lesions, ulcers. No induration; Warm and dry.  Neurologic: CN 2-12 grossly intact with no focal deficits. Romberg sign and cerebellar reflexes not assessed.  Psychiatric: Normal judgment and insight. Alert and oriented x 3. Normal mood and appropriate affect.   Data Reviewed: I have personally reviewed following labs and imaging studies  CBC: Recent Labs  Lab 09/26/23 1111 09/27/23 1126 09/27/23 1437 09/28/23 0516  WBC 10.3 7.7 7.5 6.0  NEUTROABS 8.3*  --   --   --   HGB 8.5* 8.8* 8.9* 8.5*  HCT 28.8* 29.8* 29.8* 27.8*  MCV 86.0 85.4 84.2 82.7  PLT 218 236 245 237   Basic Metabolic Panel: Recent Labs  Lab 09/26/23 1111 09/26/23 2034  09/27/23 1126 09/27/23 1437 09/28/23 0516  NA 140  --  140 140 138  K 4.5  --  4.2 4.5 4.2  CL 112*  --  111 111 110  CO2 18*  --  18* 18* 18*  GLUCOSE 136*  --  146* 119* 96  BUN 59*  --  59* 60* 63*  CREATININE 5.80*  --  6.15* 6.18* 6.23*  CALCIUM 8.6*  --  8.4* 8.5* 8.3*  MG  --  2.2  --  2.3  --   PHOS  --  6.8*  --  6.5* 7.2*   GFR: Estimated Creatinine Clearance: 15.9 mL/min (A) (by C-G formula based on SCr of 6.23 mg/dL (H)). Liver Function Tests: Recent Labs  Lab 09/26/23 1111 09/27/23 1437 09/28/23 0516  AST 15 14*  --   ALT 7 7  --   ALKPHOS 46 46  --   BILITOT 0.6 0.4  --   PROT 6.0* 6.1*  --   ALBUMIN 1.8* 1.8* 1.7*   No  results for input(s): "LIPASE", "AMYLASE" in the last 168 hours. No results for input(s): "AMMONIA" in the last 168 hours. Coagulation Profile: No results for input(s): "INR", "PROTIME" in the last 168 hours. Cardiac Enzymes: No results for input(s): "CKTOTAL", "CKMB", "CKMBINDEX", "TROPONINI" in the last 168 hours. BNP (last 3 results) No results for input(s): "PROBNP" in the last 8760 hours. HbA1C: No results for input(s): "HGBA1C" in the last 72 hours. CBG: Recent Labs  Lab 09/27/23 1642 09/27/23 2052 09/28/23 0802 09/28/23 1235 09/28/23 1613  GLUCAP 137* 137* 90 109* 103*   Lipid Profile: No results for input(s): "CHOL", "HDL", "LDLCALC", "TRIG", "CHOLHDL", "LDLDIRECT" in the last 72 hours. Thyroid Function Tests: Recent Labs    09/26/23 2034  TSH 2.713   Anemia Panel: Recent Labs    09/26/23 2034 09/27/23 0037  VITAMINB12  --  383  FOLATE  --  11.4  FERRITIN 85  --   TIBC 242*  --   IRON 40  --   RETICCTPCT  --  2.4   Sepsis Labs: Recent Labs  Lab 09/26/23 1312 09/27/23 0037  LATICACIDVEN 0.5 0.5   Recent Results (from the past 240 hours)  Resp panel by RT-PCR (RSV, Flu A&B, Covid) Anterior Nasal Swab     Status: None   Collection Time: 09/26/23 11:40 AM   Specimen: Anterior Nasal Swab  Result Value  Ref Range Status   SARS Coronavirus 2 by RT PCR NEGATIVE NEGATIVE Final   Influenza A by PCR NEGATIVE NEGATIVE Final   Influenza B by PCR NEGATIVE NEGATIVE Final    Comment: (NOTE) The Xpert Xpress SARS-CoV-2/FLU/RSV plus assay is intended as an aid in the diagnosis of influenza from Nasopharyngeal swab specimens and should not be used as a sole basis for treatment. Nasal washings and aspirates are unacceptable for Xpert Xpress SARS-CoV-2/FLU/RSV testing.  Fact Sheet for Patients: BloggerCourse.com  Fact Sheet for Healthcare Providers: SeriousBroker.it  This test is not yet approved or cleared by the [place] FDA and has been authorized for detection and/or diagnosis of SARS-CoV-2 by FDA under an Emergency Use Authorization (EUA). This EUA will remain in effect (meaning this test can be used) for the duration of the COVID-19 declaration under Section 564(b)(1) of the Act, 21 U.S.C. section 360bbb-3(b)(1), unless the authorization is terminated or revoked.     Resp Syncytial Virus by PCR NEGATIVE NEGATIVE Final    Comment: (NOTE) Fact Sheet for Patients: BloggerCourse.com  Fact Sheet for Healthcare Providers: SeriousBroker.it  This test is not yet approved or cleared by the [place] FDA and has been authorized for detection and/or diagnosis of SARS-CoV-2 by FDA under an Emergency Use Authorization (EUA). This EUA will remain in effect (meaning this test can be used) for the duration of the COVID-19 declaration under Section 564(b)(1) of the Act, 21 U.S.C. section 360bbb-3(b)(1), unless the authorization is terminated or revoked.  Performed at [facility], [address], [place], [place] [address]     Radiology Studies: ECHOCARDIOGRAM LIMITED Result Date: 09/27/2023    ECHOCARDIOGRAM LIMITED REPORT   Patient Name:   SADIYAH KANGAS Date of Exam: 09/27/2023  Medical Rec #:  [id number]      Height:       65.0 in Accession #:    [id number]     Weight:       311.9 lb Date of Birth:  April 29, 1976      BSA:          2.389 m Patient Age:  47 years       BP:           165/86 mmHg Patient Gender: F              HR:           84 bpm. Exam Location:  Inpatient Procedure: Limited Echo, Color Doppler and Cardiac Doppler (Both Spectral and            Color Flow Doppler were utilized during procedure). Indications:    acute diastolic congestive heart failure  History:        Patient has prior history of Echocardiogram examinations, most                 recent 07/23/2023. Chronic kidney disease; Risk                 Factors:Hypertension and Diabetes.  Sonographer:    Delcie Roch RDCS Referring Phys: Kenn File DOUTOVA  Sonographer Comments: Global longitudinal strain was attempted. IMPRESSIONS  1. Left ventricular ejection fraction, by estimation, is 70 to 75%. The left ventricle has hyperdynamic function. There is moderate concentric left ventricular hypertrophy. Left ventricular diastolic parameters are consistent with Grade II diastolic dysfunction (pseudonormalization). The average left ventricular global longitudinal strain is -18.6 %. The global longitudinal strain is normal.  2. Left atrial size was mildly dilated.  3. Trivial mitral valve regurgitation.  4. The aortic valve is tricuspid. There is mild calcification of the aortic valve.  5. There is mildly elevated pulmonary artery systolic pressure. The estimated right ventricular systolic pressure is 38.8 mmHg.  6. The inferior vena cava is normal in size with greater than 50% respiratory variability, suggesting right atrial pressure of 3 mmHg. FINDINGS  Left Ventricle: Left ventricular ejection fraction, by estimation, is 70 to 75%. The left ventricle has hyperdynamic function. The average left ventricular global longitudinal strain is -18.6 %. Strain was performed and the global longitudinal strain is  normal.  There is moderate concentric left ventricular hypertrophy. Left ventricular diastolic parameters are consistent with Grade II diastolic dysfunction (pseudonormalization). Right Ventricle: There is mildly elevated pulmonary artery systolic pressure. The tricuspid regurgitant velocity is 2.99 m/s, and with an assumed right atrial pressure of 3 mmHg, the estimated right ventricular systolic pressure is 38.8 mmHg. Left Atrium: Left atrial size was mildly dilated. Mitral Valve: Trivial mitral valve regurgitation. Tricuspid Valve: Tricuspid valve regurgitation is mild. Aortic Valve: The aortic valve is tricuspid. There is mild calcification of the aortic valve. Pulmonic Valve: Pulmonic valve regurgitation is trivial. Venous: The inferior vena cava is normal in size with greater than 50% respiratory variability, suggesting right atrial pressure of 3 mmHg. Additional Comments: Spectral Doppler performed. Color Doppler performed.  LEFT VENTRICLE PLAX 2D LVIDd:         4.50 cm   Diastology LVIDs:         2.50 cm   LV e' medial:    5.11 cm/s LV PW:         1.40 cm   LV E/e' medial:  16.6 LV IVS:        1.40 cm   LV e' lateral:   4.68 cm/s LVOT diam:     2.20 cm   LV E/e' lateral: 18.1 LV SV:         108 LV SV Index:   45        2D Longitudinal Strain LVOT Area:     3.80 cm  2D Strain GLS Avg:     -  18.6 %  IVC IVC diam: 1.80 cm LEFT ATRIUM         Index LA diam:    4.40 cm 1.84 cm/m  AORTIC VALVE LVOT Vmax:   127.00 cm/s LVOT Vmean:  89.100 cm/s LVOT VTI:    0.284 m MITRAL VALVE               TRICUSPID VALVE MV Area (PHT): 2.69 cm    TR Peak grad:   35.8 mmHg MV Decel Time: 282 msec    TR Vmax:        299.00 cm/s MV E velocity: 84.90 cm/s MV A velocity: 83.00 cm/s  SHUNTS MV E/A ratio:  1.02        Systemic VTI:  0.28 m                            Systemic Diam: 2.20 cm Arvilla Meres MD Electronically signed by Arvilla Meres MD Signature Date/Time: 09/27/2023/2:25:31 PM    Final    Scheduled Meds:  apixaban  5 mg Oral  BID   atorvastatin  80 mg Oral Daily   calcitRIOL  0.5 mcg Oral Daily   citalopram  30 mg Oral Daily   ferrous sulfate  325 mg Oral Daily   loratadine  10 mg Oral Daily   metoprolol succinate  100 mg Oral Daily   pantoprazole  40 mg Oral Daily   sodium chloride flush  3 mL Intravenous Q12H   Continuous Infusions:  iron sucrose 200 mg (09/28/23 1109)    LOS: 2 days   Marguerita Merles, DO [facility] Available via Epic secure chat 7am-7pm After these hours, please refer to coverage provider listed on [email or web address] 09/28/2023, 6:39 PM

## 2023-09-28 NOTE — Progress Notes (Signed)
 PROGRESS NOTE    Melissa [name]  ZOX:[id number] DOB: 03-25-1976 DOA: 09/26/2023 PCP: Shelby Dubin, FNP   Brief Narrative:  The patient is a 48 year old female with CKD stage V s/p right arm fistula, not on hemodialysis yet,  HTN, hyperlipidemia, A-fib on Eliquis, obesity presented to ED with shortness of breath, worse with exertion or ambulation, was found to be hypoxic in ED.  Admitted for Acute on Chronic Diastolic CHF in the setting of CKD Stage 5 and Nephrology consulted and diuretics are being increased.  She desaturated on amatory home O2 screen so we will keep her another night and continue diuretics with IV.  Assessment and Plan:  Acute on Chronic Diastolic CHF (congestive heart failure) (HCC) in the setting of CKD stage V, volume overload -Presented with significant shortness of breath, DOE, pulmonary edema on chest x-ray, BNP 274 -Initially Started on high-dose IV Lasix 120 mg every 8 hours but did not have a great response so Nephrology increased to 160 mg twice daily x 2 Doses (this AM -Strict I's and O's and Daily Weights -2D echo 06/2023 had shown EF of 55 to 60%, G1 DD -Appreciate Nephrology Recc's  Acute Respiratory Failure with Hypoxia -Initially was hypoxic on presentation and was weaned off of supplemental oxygen via nasal cannula and had O2 saturation of 99%.  Now desaturated quite a bit on ambulation but had normal O2 sats on room air. SpO2: 99 %; O2 Flow Rate (L/min): 2 L/min After she desaturated discussed with nephrology they are recommending giving him another dose of IV Lasix 160 mg this evening.  Check chest x-ray in the a.m. and will order a flutter valve incentive spirometer. If remains persistently SOB may consider further imaging.  Repeat ambulatory home O2 screen   CKD (chronic kidney disease) stage 5, GFR less than 15 ml/min (HCC) with volume overload -BUN/Cr Trend: Recent Labs  Lab 09/21/23 0759 09/26/23 1111 09/27/23 1126 09/27/23 1437 09/28/23 0516  09/29/23 0512 09/29/23 0513  BUN 51* 59* 59* 60* 63* 69* 68*  CREATININE 6.10* 5.80* 6.15* 6.18* 6.23* 6.43* 6.48*  -C/w IV Lasix for Diuresis and Nephrology Consulted and after she desaturated Case was discussed again with nephrology who recommended keeping her instead of discharging her home and diuresing her again with IV Lasix 160 mg this evening and anticipating discharging on 80 twice daily. -C/w Calcitriol 0.5 mcg po Daily -Avoid Nephrotoxic Medications, Contrast Dyes, Hypotension and Dehydration to Ensure Adequate Renal Perfusion and will need to Renally Adjust Meds -Continue to Monitor and Trend Renal Function carefully and repeat CMP in the AM   Atrial Fibrillation, chronic (HCC): Continue Metoprolol Succinate 100 mg po daily and A/C with Apixaban 5 mg po BID. Rate controlled. CTM and Trend on Telemetry    Essential HTN: Currently on high-dose IV Lasix for diuresis (receiving IV 160 mg Lasix BID today). CTM BP per Protocol. Last BP reading was 169/72. Resume home Hydralazine 100 mg po q8h. CTM BP per Protocol. Last BP reading was 169/72  HLD: C/w Atorvastatin 80 mg po Daily  Depression and Anxiety: C/w Citalopram 30 mg po Daily  Normocytic Anemia/Anemia of Chronic Kidney Disease: B/L is around 7-7.9. Hgb/Hct went from 8.8/29.8 -> 8.9/29.8 -> 8.5/27.8 -> 7.6/24.1.  Anemia panel showed iron 40, TIBC 242, percent saturation show 17, ferritin 85, folate 11.4, B12 383. Getting IV Iron per Nephrology and continuing po Ferrous Sulfate tablet 325 mg po Daily.  Also receiving a dose of ESA.  CTM for S/Sx of  Bleeding; No overt bleeding noted. Repeat CBC in the AM   GERD/GI Prophylaxis: C/w Pantoprazole 40 mg po Daily  Hypoalbuminemia: Patient's Albumin now 1.7. CTM and Trend and repeat CMP in the AM  Class III (Super Morbid) Obesity: Complicates overall prognosis and care. Estimated body mass index is 51.3 kg/m as calculated from the following:   Height as of this encounter: 5\' 5"  (1.651  m).   Weight as of this encounter: 139.8 kg. Weight Loss and Dietary Counseling given   DVT prophylaxis: SCDs Start: 09/27/23 1319 apixaban (ELIQUIS) tablet 5 mg    Code Status: Full Code Family Communication: No family currently at bedside  Disposition Plan:  Level of care: Progressive Status is: Inpatient Remains inpatient appropriate because: Needs further clinical improvement and we will keep her another night given that she desaturated quite a bit today on ambulation   Consultants:  Nephrology  Procedures:  As delineated as above  Antimicrobials:  Anti-infectives (From admission, onward)    None       Subjective: Seen and examined at bedside and was doing okay and thinks her breathing is doing a little bit better but was short of breath on ambulation and desaturated.  No nausea or vomiting.  Does not think that she is urinating as much in response to the Lasix that she has been getting.  No other concerns or complaints at this time.  Objective: Vitals:   09/29/23 0855 09/29/23 0857 09/29/23 1309 09/29/23 1655  BP: (!) 151/71 (!) 151/71 (!) 169/72 (!) 154/86  Pulse: 79  77   Resp:   16   Temp:   97.8 F (36.6 C)   TempSrc:   Oral   SpO2: 99%  99%   Weight:      Height:        Intake/Output Summary (Last 24 hours) at 09/29/2023 1710 Last data filed at 09/29/2023 1423 Gross per 24 hour  Intake 597 ml  Output 1400 ml  Net -803 ml   Filed Weights   09/27/23 1307 09/28/23 0523 09/29/23 0500  Weight: (!) 141.5 kg (!) 140.5 kg (!) 139.8 kg   Examination: Physical Exam:  Constitutional: WN/WD super morbidly obese African-American female in no acute distress sitting at the edge of the bed Respiratory: Clear to auscultation bilaterally, no wheezing, rales, rhonchi or crackles. Normal respiratory effort and patient is not tachypenic. No accessory muscle use.  Cardiovascular: RRR, no murmurs / rubs / gallops. S1 and S2 auscultated. 1+ LE Edema   Abdomen: Soft,  non-tender, distended 2/2 body habitus. Bowel sounds positive.  GU: Deferred. Musculoskeletal: No clubbing / cyanosis of digits/nails. No joint deformity upper and lower extremities.  Skin: No rashes, lesions, ulcers on a limited skin evaluation. No induration; Warm and dry.  Neurologic: CN 2-12 grossly intact with no focal deficits. Romberg sign and cerebellar reflexes not assessed.  Psychiatric: Normal judgment and insight. Alert and oriented x 3. Normal mood and appropriate affect.   Data Reviewed: I have personally reviewed following labs and imaging studies  CBC: Recent Labs  Lab 09/26/23 1111 09/27/23 1126 09/27/23 1437 09/28/23 0516 09/29/23 0512  WBC 10.3 7.7 7.5 6.0 6.4  NEUTROABS 8.3*  --   --   --  3.9  HGB 8.5* 8.8* 8.9* 8.5* 7.6*  HCT 28.8* 29.8* 29.8* 27.8* 24.1*  MCV 86.0 85.4 84.2 82.7 82.5  PLT 218 236 245 237 249   Basic Metabolic Panel: Recent Labs  Lab 09/26/23 2034 09/27/23 1126 09/27/23 1437 09/28/23 0516  09/29/23 0512 09/29/23 0513  NA  --  140 140 138 137 138  K  --  4.2 4.5 4.2 4.1 4.2  CL  --  111 111 110 104 104  CO2  --  18* 18* 18* 20* 20*  GLUCOSE  --  146* 119* 96 80 80  BUN  --  59* 60* 63* 69* 68*  CREATININE  --  6.15* 6.18* 6.23* 6.43* 6.48*  CALCIUM  --  8.4* 8.5* 8.3* 8.3* 8.4*  MG 2.2  --  2.3  --  2.2  --   PHOS 6.8*  --  6.5* 7.2* 7.7* 7.6*   GFR: Estimated Creatinine Clearance: 15.3 mL/min (A) (by C-G formula based on SCr of 6.48 mg/dL (H)). Liver Function Tests: Recent Labs  Lab 09/26/23 1111 09/27/23 1437 09/28/23 0516 09/29/23 0512 09/29/23 0513  AST 15 14*  --  12*  --   ALT 7 7  --  7  --   ALKPHOS 46 46  --  38  --   BILITOT 0.6 0.4  --  0.5  --   PROT 6.0* 6.1*  --  5.4*  --   ALBUMIN 1.8* 1.8* 1.7* 1.7* 1.7*   No results for input(s): "LIPASE", "AMYLASE" in the last 168 hours. No results for input(s): "AMMONIA" in the last 168 hours. Coagulation Profile: No results for input(s): "INR", "PROTIME" in the  last 168 hours. Cardiac Enzymes: No results for input(s): "CKTOTAL", "CKMB", "CKMBINDEX", "TROPONINI" in the last 168 hours. BNP (last 3 results) No results for input(s): "PROBNP" in the last 8760 hours. HbA1C: No results for input(s): "HGBA1C" in the last 72 hours. CBG: Recent Labs  Lab 09/27/23 1642 09/27/23 2052 09/28/23 0802 09/28/23 1235 09/28/23 1613  GLUCAP 137* 137* 90 109* 103*   Lipid Profile: No results for input(s): "CHOL", "HDL", "LDLCALC", "TRIG", "CHOLHDL", "LDLDIRECT" in the last 72 hours. Thyroid Function Tests: Recent Labs    09/26/23 2034  TSH 2.713   Anemia Panel: Recent Labs    09/26/23 2034 09/27/23 0037  VITAMINB12  --  383  FOLATE  --  11.4  FERRITIN 85  --   TIBC 242*  --   IRON 40  --   RETICCTPCT  --  2.4   Sepsis Labs: Recent Labs  Lab 09/26/23 1312 09/27/23 0037  LATICACIDVEN 0.5 0.5   Recent Results (from the past 240 hours)  Resp panel by RT-PCR (RSV, Flu A&B, Covid) Anterior Nasal Swab     Status: None   Collection Time: 09/26/23 11:40 AM   Specimen: Anterior Nasal Swab  Result Value Ref Range Status   SARS Coronavirus 2 by RT PCR NEGATIVE NEGATIVE Final   Influenza A by PCR NEGATIVE NEGATIVE Final   Influenza B by PCR NEGATIVE NEGATIVE Final    Comment: (NOTE) The Xpert Xpress SARS-CoV-2/FLU/RSV plus assay is intended as an aid in the diagnosis of influenza from Nasopharyngeal swab specimens and should not be used as a sole basis for treatment. Nasal washings and aspirates are unacceptable for Xpert Xpress SARS-CoV-2/FLU/RSV testing.  Fact Sheet for Patients: BloggerCourse.com  Fact Sheet for Healthcare Providers: SeriousBroker.it  This test is not yet approved or cleared by the [place] FDA and has been authorized for detection and/or diagnosis of SARS-CoV-2 by FDA under an Emergency Use Authorization (EUA). This EUA will remain in effect (meaning this test can  be used) for the duration of the COVID-19 declaration under Section 564(b)(1) of the Act, 21 U.S.C. section 360bbb-3(b)(1), unless  the authorization is terminated or revoked.     Resp Syncytial Virus by PCR NEGATIVE NEGATIVE Final    Comment: (NOTE) Fact Sheet for Patients: BloggerCourse.com  Fact Sheet for Healthcare Providers: SeriousBroker.it  This test is not yet approved or cleared by the [place] FDA and has been authorized for detection and/or diagnosis of SARS-CoV-2 by FDA under an Emergency Use Authorization (EUA). This EUA will remain in effect (meaning this test can be used) for the duration of the COVID-19 declaration under Section 564(b)(1) of the Act, 21 U.S.C. section 360bbb-3(b)(1), unless the authorization is terminated or revoked.  Performed at [facility], [address], [place], [place] [address]     Radiology Studies: DG CHEST PORT 1 VIEW Result Date: 09/29/2023 CLINICAL DATA:  Shortness of breath. EXAM: PORTABLE CHEST 1 VIEW COMPARISON:  September 26, 2023. FINDINGS: Stable cardiomegaly with central pulmonary vascular congestion and possible bilateral pulmonary edema. Minimal left midlung subsegmental atelectasis is noted. Bony thorax is unremarkable. IMPRESSION: Stable cardiomegaly with central pulmonary vascular congestion and possible bilateral pulmonary edema. Electronically Signed   By: Lupita Raider M.D.   On: 09/29/2023 11:12   Scheduled Meds:  apixaban  5 mg Oral BID   atorvastatin  80 mg Oral Daily   calcitRIOL  0.5 mcg Oral Daily   citalopram  30 mg Oral Daily   ferrous sulfate  325 mg Oral Daily   hydrALAZINE  100 mg Oral TID   loratadine  10 mg Oral Daily   metoprolol succinate  100 mg Oral Daily   pantoprazole  40 mg Oral Daily   sodium chloride flush  3 mL Intravenous Q12H   Continuous Infusions:  furosemide     iron sucrose 200 mg (09/29/23 1014)    LOS: 3 days   Marguerita Merles, DO [facility] Available via Epic secure chat 7am-7pm After these hours, please refer to coverage provider listed on [email or web address] 09/29/2023, 5:10 PM

## 2023-09-28 NOTE — Progress Notes (Signed)
 Heart Failure Navigator Progress Note  Assessed for Heart & Vascular TOC clinic readiness.  Patient does not meet criteria due to EF 70-75%, CKD V, nearing dialysis. No HF TOC. .   Navigator will sign off at this time.   Rhae Hammock, BSN, Scientist, clinical (histocompatibility and immunogenetics) Only

## 2023-09-28 NOTE — Hospital Course (Addendum)
 The patient is a 48 year old female with CKD stage V s/p right arm fistula, not on hemodialysis yet,  HTN, hyperlipidemia, A-fib on Eliquis, obesity presented to ED with shortness of breath, worse with exertion or ambulation, was found to be hypoxic in ED.  Admitted for Acute on Chronic Diastolic CHF in the setting of CKD Stage 5 and Nephrology consulted and diuretics are being increased to IV Lasix 160 mg BID.  She desaturated on ambulatory home O2 screen again today but chest x-ray is improving.  Nephrology recommends continue IV diuresis for at least 1 more day.  Assessment and Plan:  Acute on Chronic Diastolic CHF (congestive heart failure) (HCC) in the setting of CKD stage V, volume overload -Presented with significant shortness of breath, DOE, pulmonary edema on chest x-ray, BNP 274 -Initially Started on high-dose IV Lasix 120 mg every 8 hours but did not have a great response so Nephrology increased to 160 mg twice daily and this is now being continued -Strict I's and O's and Daily Weights;  Intake/Output Summary (Last 24 hours) at 10/01/2023 1522 Last data filed at 10/01/2023 1050 Gross per 24 hour  Intake 797.13 ml  Output 1700 ml  Net -902.87 ml  -2D echo 06/2023 had shown EF of 55 to 60%, G1 DD -Appreciate Nephrology Recc's and they are recommending c/w Diuresis today given that she still hypoxic on ambulation; Has F/U w/ Dr. Thedore Mins at [facility] on 10/17/23  Acute Respiratory Failure with Hypoxia -Initially was hypoxic on presentation and was weaned off of supplemental oxygen via nasal cannula and had O2 saturation of 99%.  Continues to intermittently desaturate and desaturated on home O2 screen today.  SpO2: 95 %; O2 Flow Rate (L/min): 2 L/min Nephrology recommends continuing diuresis and keeping her another day for IV diuresis.  Repeat ambulatory home O2 screen prior to D/C. Repeat CXR this AM showed the heart was in the upper limits of normal in size and there is no confluent airspace  opacities, effusions or edema noted no acute bony abnormalities.  CKD (chronic kidney disease) Stage 5 / Metabolic Acidosis GFR less than 15 ml/min (HCC) with volume overload -BUN/Cr Trend: Recent Labs  Lab 09/27/23 1437 09/28/23 0516 09/29/23 0512 09/29/23 0513 09/30/23 0516 09/30/23 0517 10/01/23 0454  BUN 60* 63* 69* 68* 77* 78* 77*  CREATININE 6.18* 6.23* 6.43* 6.48* 6.60* 6.66* 6.93*  -C/w IV Lasix for Diuresis and Nephrology Consulted and after she desaturated; C/w diuresis w/ IV Lasix 160 mg BID and anticipating discharging on 80 mg of po Torsemide twice daily. -Has a slight metabolic acidosis with a CO2 of 19, anion gap of 11, chloride level of 107 -C/w Calcitriol 0.5 mcg po Daily -Avoid Nephrotoxic Medications, Contrast Dyes, Hypotension and Dehydration to Ensure Adequate Renal Perfusion and will need to Renally Adjust Meds -Continue to Monitor and Trend Renal Function carefully and repeat CMP in the AM   Atrial Fibrillation, chronic (HCC): C/w Metoprolol Succinate 100 mg po daily and A/C with Apixaban 5 mg po BID. Rate controlled. CTM and Trend on Telemetry    Essential HTN: Currently on high-dose IV Lasix for diuresis (receiving IV 160 mg Lasix BID again today). CTM BP per Protocol. Last BP reading was 169/72. Resume home Hydralazine 100 mg po q8h. CTM BP per Protocol. Last BP reading was 163/84  HLD: C/w Atorvastatin 80 mg po Daily  Depression and Anxiety: C/w Citalopram 30 mg po Daily  Normocytic Anemia/Anemia of Chronic Kidney Disease: B/L is around 7-7.9. Hgb/Hct went  from 8.8/29.8 -> 8.9/29.8 -> 8.5/27.8 -> 7.6/24.1 -> 8.3/27.1 -> 8.3/25.8.  Anemia panel showed iron 40, TIBC 242, percent saturation show 17, ferritin 85, folate 11.4, B12 383. Getting IV Iron per Nephrology and continuing po Ferrous Sulfate tablet 325 mg po Daily.  Also received a dose of ESA.  CTM for S/Sx of Bleeding; No overt bleeding noted. Repeat CBC in the AM   GERD/GI Prophylaxis: C/w Pantoprazole  40 mg po Daily  Hypoalbuminemia: Patient's Albumin now 1.8 again. CTM and Trend and repeat CMP in the AM  Class III (Super Morbid) Obesity: Complicates overall prognosis and care. Estimated body mass index is 51.47 kg/m as calculated from the following:   Height as of this encounter: 5\' 5"  (1.651 m).   Weight as of this encounter: 140.3 kg. Weight Loss and Dietary Counseling given

## 2023-09-28 NOTE — Progress Notes (Signed)
 [name] Progress Note   Subjective:   Cont with dyspnea and orthopnea. No uremic symptoms.  I/Os recorded 1.4 / 1.96  Objective Vitals:   09/27/23 1313 09/27/23 1643 09/27/23 2047 09/28/23 0523  BP: (!) 165/86 (!) 148/80 (!) 154/92 (!) 171/75  Pulse: 81 81 81 77  Resp: 15 16 20 18   Temp: 98 F (36.7 C) 98.3 F (36.8 C) 98.6 F (37 C) (!) 97.5 F (36.4 C)  TempSrc: Oral Oral Oral Oral  SpO2: 94% 93% 93% 93%  Weight:    (!) 140.5 kg  Height:       Physical Exam General: obese woman comfortable at rest Heart: RRR no rub Lungs: rales to mid fields Abdomen: soft obese Extremities: no pedal edema Dialysis Access: RUE AVF +tb immature  Additional Objective Labs: Basic Metabolic Panel: Recent Labs  Lab 09/26/23 2034 09/27/23 1126 09/27/23 1437 09/28/23 0516  NA  --  140 140 138  K  --  4.2 4.5 4.2  CL  --  111 111 110  CO2  --  18* 18* 18*  GLUCOSE  --  146* 119* 96  BUN  --  59* 60* 63*  CREATININE  --  6.15* 6.18* 6.23*  CALCIUM  --  8.4* 8.5* 8.3*  PHOS 6.8*  --  6.5* 7.2*   Liver Function Tests: Recent Labs  Lab 09/26/23 1111 09/27/23 1437 09/28/23 0516  AST 15 14*  --   ALT 7 7  --   ALKPHOS 46 46  --   BILITOT 0.6 0.4  --   PROT 6.0* 6.1*  --   ALBUMIN 1.8* 1.8* 1.7*   No results for input(s): "LIPASE", "AMYLASE" in the last 168 hours. CBC: Recent Labs  Lab 09/26/23 1111 09/27/23 1126 09/27/23 1437 09/28/23 0516  WBC 10.3 7.7 7.5 6.0  NEUTROABS 8.3*  --   --   --   HGB 8.5* 8.8* 8.9* 8.5*  HCT 28.8* 29.8* 29.8* 27.8*  MCV 86.0 85.4 84.2 82.7  PLT 218 236 245 237   Blood Culture No results found for: "SDES", "SPECREQUEST", "CULT", "REPTSTATUS"  Cardiac Enzymes: No results for input(s): "CKTOTAL", "CKMB", "CKMBINDEX", "TROPONINI" in the last 168 hours. CBG: Recent Labs  Lab 09/21/23 1032 09/26/23 1924 09/27/23 1642 09/27/23 2052 09/28/23 0802  GLUCAP 128* 72 137* 137* 90   Iron Studies:  Recent Labs     09/26/23 2034  IRON 40  TIBC 242*  FERRITIN 85   @lablastinr3 @ Studies/Results: ECHOCARDIOGRAM LIMITED Result Date: 09/27/2023    ECHOCARDIOGRAM LIMITED REPORT   Patient Name:   Melissa [name] Date of Exam: 09/27/2023 Medical Rec #:  [id number]      Height:       65.0 in Accession #:    [id number]     Weight:       311.9 lb Date of Birth:  05-20-1976      BSA:          2.389 m Patient Age:    48 years       BP:           165/86 mmHg Patient Gender: F              HR:           84 bpm. Exam Location:  Inpatient Procedure: Limited Echo, Color Doppler and Cardiac Doppler (Both Spectral and            Color Flow Doppler were utilized during procedure). Indications:  acute diastolic congestive heart failure  History:        Patient has prior history of Echocardiogram examinations, most                 recent 07/23/2023. Chronic kidney disease; Risk                 Factors:Hypertension and Diabetes.  Sonographer:    Delcie Roch RDCS Referring Phys: Kenn File DOUTOVA  Sonographer Comments: Global longitudinal strain was attempted. IMPRESSIONS  1. Left ventricular ejection fraction, by estimation, is 70 to 75%. The left ventricle has hyperdynamic function. There is moderate concentric left ventricular hypertrophy. Left ventricular diastolic parameters are consistent with Grade II diastolic dysfunction (pseudonormalization). The average left ventricular global longitudinal strain is -18.6 %. The global longitudinal strain is normal.  2. Left atrial size was mildly dilated.  3. Trivial mitral valve regurgitation.  4. The aortic valve is tricuspid. There is mild calcification of the aortic valve.  5. There is mildly elevated pulmonary artery systolic pressure. The estimated right ventricular systolic pressure is 38.8 mmHg.  6. The inferior vena cava is normal in size with greater than 50% respiratory variability, suggesting right atrial pressure of 3 mmHg. FINDINGS  Left Ventricle: Left ventricular ejection  fraction, by estimation, is 70 to 75%. The left ventricle has hyperdynamic function. The average left ventricular global longitudinal strain is -18.6 %. Strain was performed and the global longitudinal strain is  normal. There is moderate concentric left ventricular hypertrophy. Left ventricular diastolic parameters are consistent with Grade II diastolic dysfunction (pseudonormalization). Right Ventricle: There is mildly elevated pulmonary artery systolic pressure. The tricuspid regurgitant velocity is 2.99 m/s, and with an assumed right atrial pressure of 3 mmHg, the estimated right ventricular systolic pressure is 38.8 mmHg. Left Atrium: Left atrial size was mildly dilated. Mitral Valve: Trivial mitral valve regurgitation. Tricuspid Valve: Tricuspid valve regurgitation is mild. Aortic Valve: The aortic valve is tricuspid. There is mild calcification of the aortic valve. Pulmonic Valve: Pulmonic valve regurgitation is trivial. Venous: The inferior vena cava is normal in size with greater than 50% respiratory variability, suggesting right atrial pressure of 3 mmHg. Additional Comments: Spectral Doppler performed. Color Doppler performed.  LEFT VENTRICLE PLAX 2D LVIDd:         4.50 cm   Diastology LVIDs:         2.50 cm   LV e' medial:    5.11 cm/s LV PW:         1.40 cm   LV E/e' medial:  16.6 LV IVS:        1.40 cm   LV e' lateral:   4.68 cm/s LVOT diam:     2.20 cm   LV E/e' lateral: 18.1 LV SV:         108 LV SV Index:   45        2D Longitudinal Strain LVOT Area:     3.80 cm  2D Strain GLS Avg:     -18.6 %  IVC IVC diam: 1.80 cm LEFT ATRIUM         Index LA diam:    4.40 cm 1.84 cm/m  AORTIC VALVE LVOT Vmax:   127.00 cm/s LVOT Vmean:  89.100 cm/s LVOT VTI:    0.284 m MITRAL VALVE               TRICUSPID VALVE MV Area (PHT): 2.69 cm    TR Peak grad:   35.8 mmHg MV Decel  Time: 282 msec    TR Vmax:        299.00 cm/s MV E velocity: 84.90 cm/s MV A velocity: 83.00 cm/s  SHUNTS MV E/A ratio:  1.02        Systemic  VTI:  0.28 m                            Systemic Diam: 2.20 cm Arvilla Meres MD Electronically signed by Arvilla Meres MD Signature Date/Time: 09/27/2023/2:25:31 PM    Final    DG Chest Portable 1 View Result Date: 09/26/2023 CLINICAL DATA:  Peripheral edema.  Shortness of breath EXAM: PORTABLE CHEST 1 VIEW COMPARISON:  Chest radiograph dated 07/22/2023. FINDINGS: Cardiomegaly with vascular congestion and edema. No large pleural effusion. No pneumothorax. No acute osseous pathology IMPRESSION: Cardiomegaly with vascular congestion and edema. Electronically Signed   By: Elgie Collard M.D.   On: 09/26/2023 13:51   Medications:  sodium chloride      apixaban  5 mg Oral BID   atorvastatin  80 mg Oral Daily   calcitRIOL  0.5 mcg Oral Daily   citalopram  30 mg Oral Daily   ferrous sulfate  325 mg Oral Daily   metoprolol succinate  100 mg Oral Daily   pantoprazole  40 mg Oral Daily   sodium chloride flush  3 mL Intravenous Q12H   torsemide  80 mg Oral BID   Assessment/PlanTyneika S Kontos is an 48 y.o. female with DM, A fib, GERD, HTN, obesity, CKD 5, HFpEF currently admitted with hypervolemia and nephrology is consulted for assistance in management of CKD and diuresis.     **AHRF in setting of A/C HFpEF:  Being diuresed, not a great response to 120 BID so will increase to 160 BID.  Will need change to outpt diuretic regimen outpt at d/c - was taking torsemide 40 BID will tentatively increase to 80 BID with sliding scale parameters.  Low na/fluid restricted diet.  Strict I/Os.    **CKD V: Baseline advanced CKD making plans for HD.  F/b Dr. Thedore Mins outpt. No current indications for RRT but will need to be followed closely.     **Anemia:  Hb 8.5 stable c/w recent priors.  Iron sat < 20%, Hb 8.5 - give IV iron while admitted.   **Secondary hyperparathyroidism:  phos 6.8, Corr Ca ok.  PTH 08/2023 185.  Renal diet.    **HTN: resumed home meds, follow with diuresis.    *A fib: on eliquis and BB.     **Obesity: BMI 51.   Will follow, reach out w concerns  Estill Bakes MD 09/28/2023, 9:16 AM  [facility] Pager: [phone] \

## 2023-09-28 NOTE — TOC Initial Note (Signed)
 Transition of Care [facility]) - Initial/Assessment Note    Patient Details  Name: Melissa [name] MRN: [id number] Date of Birth: 06/13/1976  [facility]) CM/SW Contact:    Gala Lewandowsky, RN Phone Number: 09/28/2023, 3:10 PM  Clinical Narrative: Patient presented for shortness of breath-IV Lasix. PTA patient states she was from home with roommates. Patient has a PCP and gets to appointments without any issues. Patient is currently on oxygen; however no 02 baseline. Case Manager will continue to follow for transition of care needs as the patient progresses.                   Expected Discharge Plan: Home/Self Care Barriers to Discharge: Continued Medical Work up   Patient Goals and CMS Choice Patient states their goals for this hospitalization and ongoing recovery are:: plan to return home once stable.   Choice offered to / list presented to : NA    Expected Discharge Plan and Services   Discharge Planning Services: CM Consult   Living arrangements for the past 2 months: Single Family Home  Prior Living Arrangements/Services Living arrangements for the past 2 months: Single Family Home Lives with:: Roommate Patient language and need for interpreter reviewed:: Yes Do you feel safe going back to the place where you live?: Yes      Need for Family Participation in Patient Care: No (Comment) Care giver support system in place?: No (comment)   Criminal Activity/Legal Involvement Pertinent to Current Situation/Hospitalization: No - Comment as needed  Activities of Daily Living   ADL Screening (condition at time of admission) Independently performs ADLs?: Yes (appropriate for developmental age) Is the patient deaf or have difficulty hearing?: No Does the patient have difficulty seeing, even when wearing glasses/contacts?: No Does the patient have difficulty concentrating, remembering, or making decisions?: No  Permission Sought/Granted Permission sought to  share information with : Family Supports, Case Manager   Emotional Assessment Appearance:: Appears stated age Attitude/Demeanor/Rapport: Engaged Affect (typically observed): Appropriate Orientation: : Oriented to Self, Oriented to Place, Oriented to  Time, Oriented to Situation Alcohol / Substance Use: Not Applicable Psych Involvement: No (comment)  Admission diagnosis:  SOB (shortness of breath) [R06.02] Hypoxia [R09.02] Acute on chronic diastolic CHF (congestive heart failure) (HCC) [I50.33] Patient Active Problem List   Diagnosis Date Noted   Acute on chronic diastolic CHF (congestive heart failure) (HCC) 09/26/2023   CKD (chronic kidney disease) stage 5, GFR less than 15 ml/min (HCC) 09/26/2023   Pleural effusion due to CHF (congestive heart failure) (HCC) 07/23/2023   Elevated brain natriuretic peptide (BNP) level 07/23/2023   Acute respiratory failure with hypoxia (HCC) 07/23/2023   Essential hypertension 07/23/2023   Anemia of chronic renal failure 07/15/2023   Hypertensive emergency 04/17/2023   Acute kidney injury superimposed on stage 5 chronic kidney disease, not on chronic dialysis (HCC) 04/17/2023   Intractable vomiting 04/17/2023   DM (diabetes mellitus) (HCC) 04/17/2023   Morbid obesity with BMI of 50.0-59.9, adult (HCC) 04/17/2023   Atrial fibrillation, chronic (HCC) 04/17/2023   Nausea and vomiting 01/04/2022   Pain of upper abdomen 01/04/2022   Diabetes mellitus affecting pregnancy 01/20/2016   Advanced maternal age, primigravida, antepartum 01/20/2016   Advanced maternal age, 1st pregnancy    PCP:  Bucio, Julian Reil, FNP Pharmacy:   [facility] [address], [place] - [address] [place] [address] Phone: [phone] Fax: [phone]  [facility] [address] [place] [address] Phone: [phone] Fax: [phone]  Social Drivers of Health (SDOH) Social History: SDOH Screenings   Food Insecurity: No  Food Insecurity (09/26/2023)  Housing: Low Risk  (09/26/2023)  Transportation Needs: No Transportation Needs (09/26/2023)  Utilities: Not At Risk (09/26/2023)  Depression (PHQ2-9): Low Risk  (07/22/2023)  Financial Resource Strain: Low Risk  (11/10/2021)   Received from [facility]  Tobacco Use: Medium Risk (09/26/2023)  Health Literacy: Low Risk  (10/19/2021)   Received from [facility], [facility]    Readmission Risk Interventions    07/24/2023    3:28 PM 07/24/2023   12:38 PM  Readmission Risk Prevention Plan  Transportation Screening Complete Complete  PCP or Specialist Appt within 3-5 Days  Complete  HRI or Home Care Consult  Complete  Social Work Consult for Recovery Care Planning/Counseling  Complete  Palliative Care Screening  Not Applicable  Medication Review Oceanographer)  Complete

## 2023-09-29 ENCOUNTER — Inpatient Hospital Stay (HOSPITAL_COMMUNITY)

## 2023-09-29 DIAGNOSIS — I5033 Acute on chronic diastolic (congestive) heart failure: Secondary | ICD-10-CM | POA: Diagnosis not present

## 2023-09-29 DIAGNOSIS — J9601 Acute respiratory failure with hypoxia: Secondary | ICD-10-CM | POA: Diagnosis not present

## 2023-09-29 DIAGNOSIS — N189 Chronic kidney disease, unspecified: Secondary | ICD-10-CM | POA: Diagnosis not present

## 2023-09-29 DIAGNOSIS — I482 Chronic atrial fibrillation, unspecified: Secondary | ICD-10-CM | POA: Diagnosis not present

## 2023-09-29 LAB — COMPREHENSIVE METABOLIC PANEL WITH GFR
ALT: 7 U/L (ref 0–44)
AST: 12 U/L — ABNORMAL LOW (ref 15–41)
Albumin: 1.7 g/dL — ABNORMAL LOW (ref 3.5–5.0)
Alkaline Phosphatase: 38 U/L (ref 38–126)
Anion gap: 13 (ref 5–15)
BUN: 69 mg/dL — ABNORMAL HIGH (ref 6–20)
CO2: 20 mmol/L — ABNORMAL LOW (ref 22–32)
Calcium: 8.3 mg/dL — ABNORMAL LOW (ref 8.9–10.3)
Chloride: 104 mmol/L (ref 98–111)
Creatinine, Ser: 6.43 mg/dL — ABNORMAL HIGH (ref 0.44–1.00)
GFR, Estimated: 7 mL/min — ABNORMAL LOW (ref >60)
Glucose, Bld: 80 mg/dL (ref 70–99)
Potassium: 4.1 mmol/L (ref 3.5–5.1)
Sodium: 137 mmol/L (ref 135–145)
Total Bilirubin: 0.5 mg/dL (ref 0.0–1.2)
Total Protein: 5.4 g/dL — ABNORMAL LOW (ref 6.5–8.1)

## 2023-09-29 LAB — RENAL FUNCTION PANEL
Albumin: 1.7 g/dL — ABNORMAL LOW (ref 3.5–5.0)
Anion gap: 14 (ref 5–15)
BUN: 68 mg/dL — ABNORMAL HIGH (ref 6–20)
CO2: 20 mmol/L — ABNORMAL LOW (ref 22–32)
Calcium: 8.4 mg/dL — ABNORMAL LOW (ref 8.9–10.3)
Chloride: 104 mmol/L (ref 98–111)
Creatinine, Ser: 6.48 mg/dL — ABNORMAL HIGH (ref 0.44–1.00)
GFR, Estimated: 7 mL/min — ABNORMAL LOW (ref >60)
Glucose, Bld: 80 mg/dL (ref 70–99)
Phosphorus: 7.6 mg/dL — ABNORMAL HIGH (ref 2.5–4.6)
Potassium: 4.2 mmol/L (ref 3.5–5.1)
Sodium: 138 mmol/L (ref 135–145)

## 2023-09-29 LAB — CBC WITH DIFFERENTIAL/PLATELET
Abs Immature Granulocytes: 0.03 10*3/uL (ref 0.00–0.07)
Basophils Absolute: 0 10*3/uL (ref 0.0–0.1)
Basophils Relative: 1 %
Eosinophils Absolute: 0.4 10*3/uL (ref 0.0–0.5)
Eosinophils Relative: 7 %
HCT: 24.1 % — ABNORMAL LOW (ref 36.0–46.0)
Hemoglobin: 7.6 g/dL — ABNORMAL LOW (ref 12.0–15.0)
Immature Granulocytes: 1 %
Lymphocytes Relative: 19 %
Lymphs Abs: 1.2 10*3/uL (ref 0.7–4.0)
MCH: 26 pg (ref 26.0–34.0)
MCHC: 31.5 g/dL (ref 30.0–36.0)
MCV: 82.5 fL (ref 80.0–100.0)
Monocytes Absolute: 0.8 10*3/uL (ref 0.1–1.0)
Monocytes Relative: 12 %
Neutro Abs: 3.9 10*3/uL (ref 1.7–7.7)
Neutrophils Relative %: 60 %
Platelets: 249 10*3/uL (ref 150–400)
RBC: 2.92 MIL/uL — ABNORMAL LOW (ref 3.87–5.11)
RDW: 17.2 % — ABNORMAL HIGH (ref 11.5–15.5)
WBC: 6.4 10*3/uL (ref 4.0–10.5)
nRBC: 0.3 % — ABNORMAL HIGH (ref 0.0–0.2)

## 2023-09-29 LAB — PHOSPHORUS: Phosphorus: 7.7 mg/dL — ABNORMAL HIGH (ref 2.5–4.6)

## 2023-09-29 LAB — MAGNESIUM: Magnesium: 2.2 mg/dL (ref 1.7–2.4)

## 2023-09-29 MED ORDER — SODIUM CHLORIDE 0.9 % IV SOLN
200.0000 mg | INTRAVENOUS | Status: AC
  Administered 2023-09-29 – 2023-09-30 (×2): 200 mg via INTRAVENOUS
  Filled 2023-09-29 (×2): qty 10

## 2023-09-29 MED ORDER — FUROSEMIDE 10 MG/ML IJ SOLN
160.0000 mg | Freq: Four times a day (QID) | INTRAVENOUS | Status: AC
Start: 2023-09-29 — End: 2023-09-29
  Administered 2023-09-29: 160 mg via INTRAVENOUS
  Filled 2023-09-29: qty 10

## 2023-09-29 MED ORDER — HYDRALAZINE HCL 50 MG PO TABS
100.0000 mg | ORAL_TABLET | Freq: Three times a day (TID) | ORAL | Status: DC
  Administered 2023-09-29 – 2023-10-02 (×9): 100 mg via ORAL
  Filled 2023-09-29 (×10): qty 2

## 2023-09-29 MED ORDER — DARBEPOETIN ALFA 60 MCG/0.3ML IJ SOSY
60.0000 ug | PREFILLED_SYRINGE | Freq: Once | INTRAMUSCULAR | Status: AC
  Administered 2023-09-29: 60 ug via SUBCUTANEOUS
  Filled 2023-09-29: qty 0.3

## 2023-09-29 MED ORDER — FUROSEMIDE 10 MG/ML IJ SOLN: 160.0000 mg | Freq: Four times a day (QID) | INTRAVENOUS | Status: DC

## 2023-09-29 NOTE — Plan of Care (Signed)
  Problem: Education: Goal: Knowledge of General Education information will improve Description: Including pain rating scale, medication(s)/side effects and non-pharmacologic comfort measures Outcome: Progressing   Problem: Health Behavior/Discharge Planning: Goal: Ability to manage health-related needs will improve Outcome: Progressing   Problem: Clinical Measurements: Goal: Ability to maintain clinical measurements within normal limits will improve Outcome: Progressing Goal: Will remain free from infection Outcome: Progressing Goal: Diagnostic test results will improve Outcome: Progressing Goal: Respiratory complications will improve Outcome: Progressing Goal: Cardiovascular complication will be avoided Outcome: Progressing   Problem: Activity: Goal: Risk for activity intolerance will decrease Outcome: Progressing   Problem: Nutrition: Goal: Adequate nutrition will be maintained Outcome: Progressing   Problem: Coping: Goal: Level of anxiety will decrease Outcome: Progressing   Problem: Elimination: Goal: Will not experience complications related to bowel motility Outcome: Progressing Goal: Will not experience complications related to urinary retention Outcome: Progressing   Problem: Pain Managment: Goal: General experience of comfort will improve and/or be controlled Outcome: Progressing   Problem: Safety: Goal: Ability to remain free from injury will improve Outcome: Progressing   Problem: Skin Integrity: Goal: Risk for impaired skin integrity will decrease Outcome: Progressing   Problem: Education: Goal: Ability to demonstrate management of disease process will improve Outcome: Progressing Goal: Ability to verbalize understanding of medication therapies will improve Outcome: Progressing Goal: Individualized Educational Video(s) Outcome: Progressing   Problem: Activity: Goal: Capacity to carry out activities will improve Outcome: Progressing    Problem: Cardiac: Goal: Ability to achieve and maintain adequate cardiopulmonary perfusion will improve Outcome: Progressing   Problem: Education: Goal: Ability to demonstrate management of disease process will improve Outcome: Progressing Goal: Ability to verbalize understanding of medication therapies will improve Outcome: Progressing Goal: Individualized Educational Video(s) Outcome: Progressing   Problem: Activity: Goal: Capacity to carry out activities will improve Outcome: Progressing   Problem: Cardiac: Goal: Ability to achieve and maintain adequate cardiopulmonary perfusion will improve Outcome: Progressing

## 2023-09-29 NOTE — Progress Notes (Signed)
 [facility] Progress Note   Subjective:   Improved dyspnea but hasn't ambulated in hall. No uremic symptoms.  I/Os recorded 0.2 / 2L, net neg 3.5 for admit.   Objective Vitals:   09/28/23 1938 09/29/23 0325 09/29/23 0500 09/29/23 0857  BP: (!) 144/78 133/60  (!) 151/71  Pulse: 75 76    Resp: 16 18    Temp: 98 F (36.7 C) 98.1 F (36.7 C)    TempSrc: Oral Oral    SpO2: 96% 95%    Weight:   (!) 139.8 kg   Height:       Physical Exam General: obese woman comfortable at rest Heart: RRR no rub Lungs: rales to mid fields Abdomen: soft obese Extremities: no pedal edema Dialysis Access: RUE AVF +tb immature  Additional Objective Labs: Basic Metabolic Panel: Recent Labs  Lab 09/28/23 0516 09/29/23 0512 09/29/23 0513  NA 138 137 138  K 4.2 4.1 4.2  CL 110 104 104  CO2 18* 20* 20*  GLUCOSE 96 80 80  BUN 63* 69* 68*  CREATININE 6.23* 6.43* 6.48*  CALCIUM 8.3* 8.3* 8.4*  PHOS 7.2* 7.7* 7.6*   Liver Function Tests: Recent Labs  Lab 09/26/23 1111 09/27/23 1437 09/28/23 0516 09/29/23 0512 09/29/23 0513  AST 15 14*  --  12*  --   ALT 7 7  --  7  --   ALKPHOS 46 46  --  38  --   BILITOT 0.6 0.4  --  0.5  --   PROT 6.0* 6.1*  --  5.4*  --   ALBUMIN 1.8* 1.8* 1.7* 1.7* 1.7*   No results for input(s): "LIPASE", "AMYLASE" in the last 168 hours. CBC: Recent Labs  Lab 09/26/23 1111 09/27/23 1126 09/27/23 1437 09/28/23 0516 09/29/23 0512  WBC 10.3 7.7 7.5 6.0 6.4  NEUTROABS 8.3*  --   --   --  3.9  HGB 8.5* 8.8* 8.9* 8.5* 7.6*  HCT 28.8* 29.8* 29.8* 27.8* 24.1*  MCV 86.0 85.4 84.2 82.7 82.5  PLT 218 236 245 237 249   Blood Culture No results found for: "SDES", "SPECREQUEST", "CULT", "REPTSTATUS"  Cardiac Enzymes: No results for input(s): "CKTOTAL", "CKMB", "CKMBINDEX", "TROPONINI" in the last 168 hours. CBG: Recent Labs  Lab 09/27/23 1642 09/27/23 2052 09/28/23 0802 09/28/23 1235 09/28/23 1613  GLUCAP 137* 137* 90 109* 103*   Iron  Studies:  Recent Labs    09/26/23 2034  IRON 40  TIBC 242*  FERRITIN 85   @lablastinr3 @ Studies/Results: ECHOCARDIOGRAM LIMITED Result Date: 09/27/2023    ECHOCARDIOGRAM LIMITED REPORT   Patient Name:   LAKEENA DOWNIE Date of Exam: 09/27/2023 Medical Rec #:  [id number]      Height:       65.0 in Accession #:    [id number]     Weight:       311.9 lb Date of Birth:  Aug 02, 1975      BSA:          2.389 m Patient Age:    47 years       BP:           165/86 mmHg Patient Gender: F              HR:           84 bpm. Exam Location:  Inpatient Procedure: Limited Echo, Color Doppler and Cardiac Doppler (Both Spectral and            Color Flow Doppler were utilized  during procedure). Indications:    acute diastolic congestive heart failure  History:        Patient has prior history of Echocardiogram examinations, most                 recent 07/23/2023. Chronic kidney disease; Risk                 Factors:Hypertension and Diabetes.  Sonographer:    Delcie Roch RDCS Referring Phys: Kenn File DOUTOVA  Sonographer Comments: Global longitudinal strain was attempted. IMPRESSIONS  1. Left ventricular ejection fraction, by estimation, is 70 to 75%. The left ventricle has hyperdynamic function. There is moderate concentric left ventricular hypertrophy. Left ventricular diastolic parameters are consistent with Grade II diastolic dysfunction (pseudonormalization). The average left ventricular global longitudinal strain is -18.6 %. The global longitudinal strain is normal.  2. Left atrial size was mildly dilated.  3. Trivial mitral valve regurgitation.  4. The aortic valve is tricuspid. There is mild calcification of the aortic valve.  5. There is mildly elevated pulmonary artery systolic pressure. The estimated right ventricular systolic pressure is 38.8 mmHg.  6. The inferior vena cava is normal in size with greater than 50% respiratory variability, suggesting right atrial pressure of 3 mmHg. FINDINGS  Left Ventricle:  Left ventricular ejection fraction, by estimation, is 70 to 75%. The left ventricle has hyperdynamic function. The average left ventricular global longitudinal strain is -18.6 %. Strain was performed and the global longitudinal strain is  normal. There is moderate concentric left ventricular hypertrophy. Left ventricular diastolic parameters are consistent with Grade II diastolic dysfunction (pseudonormalization). Right Ventricle: There is mildly elevated pulmonary artery systolic pressure. The tricuspid regurgitant velocity is 2.99 m/s, and with an assumed right atrial pressure of 3 mmHg, the estimated right ventricular systolic pressure is 38.8 mmHg. Left Atrium: Left atrial size was mildly dilated. Mitral Valve: Trivial mitral valve regurgitation. Tricuspid Valve: Tricuspid valve regurgitation is mild. Aortic Valve: The aortic valve is tricuspid. There is mild calcification of the aortic valve. Pulmonic Valve: Pulmonic valve regurgitation is trivial. Venous: The inferior vena cava is normal in size with greater than 50% respiratory variability, suggesting right atrial pressure of 3 mmHg. Additional Comments: Spectral Doppler performed. Color Doppler performed.  LEFT VENTRICLE PLAX 2D LVIDd:         4.50 cm   Diastology LVIDs:         2.50 cm   LV e' medial:    5.11 cm/s LV PW:         1.40 cm   LV E/e' medial:  16.6 LV IVS:        1.40 cm   LV e' lateral:   4.68 cm/s LVOT diam:     2.20 cm   LV E/e' lateral: 18.1 LV SV:         108 LV SV Index:   45        2D Longitudinal Strain LVOT Area:     3.80 cm  2D Strain GLS Avg:     -18.6 %  IVC IVC diam: 1.80 cm LEFT ATRIUM         Index LA diam:    4.40 cm 1.84 cm/m  AORTIC VALVE LVOT Vmax:   127.00 cm/s LVOT Vmean:  89.100 cm/s LVOT VTI:    0.284 m MITRAL VALVE               TRICUSPID VALVE MV Area (PHT): 2.69 cm    TR Peak grad:  35.8 mmHg MV Decel Time: 282 msec    TR Vmax:        299.00 cm/s MV E velocity: 84.90 cm/s MV A velocity: 83.00 cm/s  SHUNTS MV E/A  ratio:  1.02        Systemic VTI:  0.28 m                            Systemic Diam: 2.20 cm Arvilla Meres MD Electronically signed by Arvilla Meres MD Signature Date/Time: 09/27/2023/2:25:31 PM    Final    Medications:  iron sucrose      apixaban  5 mg Oral BID   atorvastatin  80 mg Oral Daily   calcitRIOL  0.5 mcg Oral Daily   citalopram  30 mg Oral Daily   darbepoetin (ARANESP) injection - NON-DIALYSIS  60 mcg Subcutaneous Once   ferrous sulfate  325 mg Oral Daily   loratadine  10 mg Oral Daily   metoprolol succinate  100 mg Oral Daily   pantoprazole  40 mg Oral Daily   sodium chloride flush  3 mL Intravenous Q12H   Assessment/PlanTyneika S Ndiaye is an 48 y.o. female with DM, A fib, GERD, HTN, obesity, CKD 5, HFpEF currently admitted with hypervolemia and nephrology is consulted for assistance in management of CKD and diuresis.     **AHRF in setting of A/C HFpEF:  Being diuresed, O2 sats now improved to normal on RA (req'd 2L yest).  Encouraged her to ambulate in hall and if feeling ok I think she can D/C.  Will need change to outpt diuretic regimen outpt at d/c - was taking torsemide 40 BID would increase to 80 BID with sliding scale parameters to increase to 100 BID if wt gain/edema.  Low na/fluid restricted diet.  Strict I/Os.    **CKD V: Baseline advanced CKD making plans for HD.  F/b Dr. Thedore Mins outpt. No current indications for RRT but will need to be followed closely.     **Anemia:  Hb 8.5 stable c/w recent priors.  Iron sat < 20%, Hb 8.5 > 7.5.  Giving IV iron and will give a dose of ESA today. (Aranesp 60)   **Secondary hyperparathyroidism:  phos 6.8, Corr Ca ok.  PTH 08/2023 185.  Renal diet.    **HTN: resumed home meds, follow with diuresis - improving, 133 overnight..    *A fib: on eliquis and BB.    **Obesity: BMI 51.   Will follow, reach out w concerns.Marland Kitchen as above if improved DOE with walking ok to d/c today.  Has f/u 4/21 with Dr. Thedore Mins at [facility] office.    Estill Bakes MD 09/29/2023, 9:57 AM  [facility] Pager: [phone] \

## 2023-09-29 NOTE — Progress Notes (Signed)
 Pt ambulated in the hall with continuous SPO2, desat to 85% requiring O2 to be initiated at 3 L per minute. Resting O 2 sat currently at 92% on RA.+

## 2023-09-30 ENCOUNTER — Inpatient Hospital Stay (HOSPITAL_COMMUNITY)

## 2023-09-30 DIAGNOSIS — I482 Chronic atrial fibrillation, unspecified: Secondary | ICD-10-CM | POA: Diagnosis not present

## 2023-09-30 DIAGNOSIS — J9601 Acute respiratory failure with hypoxia: Secondary | ICD-10-CM | POA: Diagnosis not present

## 2023-09-30 DIAGNOSIS — N189 Chronic kidney disease, unspecified: Secondary | ICD-10-CM | POA: Diagnosis not present

## 2023-09-30 DIAGNOSIS — I5033 Acute on chronic diastolic (congestive) heart failure: Secondary | ICD-10-CM | POA: Diagnosis not present

## 2023-09-30 LAB — CBC WITH DIFFERENTIAL/PLATELET
Abs Immature Granulocytes: 0.05 10*3/uL (ref 0.00–0.07)
Basophils Absolute: 0 10*3/uL (ref 0.0–0.1)
Basophils Relative: 0 %
Eosinophils Absolute: 0.4 10*3/uL (ref 0.0–0.5)
Eosinophils Relative: 5 %
HCT: 27.1 % — ABNORMAL LOW (ref 36.0–46.0)
Hemoglobin: 8.3 g/dL — ABNORMAL LOW (ref 12.0–15.0)
Immature Granulocytes: 1 %
Lymphocytes Relative: 20 %
Lymphs Abs: 1.4 10*3/uL (ref 0.7–4.0)
MCH: 25.2 pg — ABNORMAL LOW (ref 26.0–34.0)
MCHC: 30.6 g/dL (ref 30.0–36.0)
MCV: 82.4 fL (ref 80.0–100.0)
Monocytes Absolute: 0.8 10*3/uL (ref 0.1–1.0)
Monocytes Relative: 11 %
Neutro Abs: 4.5 10*3/uL (ref 1.7–7.7)
Neutrophils Relative %: 63 %
Platelets: 238 10*3/uL (ref 150–400)
RBC: 3.29 MIL/uL — ABNORMAL LOW (ref 3.87–5.11)
RDW: 17.2 % — ABNORMAL HIGH (ref 11.5–15.5)
WBC: 7.1 10*3/uL (ref 4.0–10.5)
nRBC: 0.3 % — ABNORMAL HIGH (ref 0.0–0.2)

## 2023-09-30 LAB — COMPREHENSIVE METABOLIC PANEL WITH GFR
ALT: 8 U/L (ref 0–44)
AST: 12 U/L — ABNORMAL LOW (ref 15–41)
Albumin: 1.8 g/dL — ABNORMAL LOW (ref 3.5–5.0)
Alkaline Phosphatase: 37 U/L — ABNORMAL LOW (ref 38–126)
Anion gap: 15 (ref 5–15)
BUN: 77 mg/dL — ABNORMAL HIGH (ref 6–20)
CO2: 17 mmol/L — ABNORMAL LOW (ref 22–32)
Calcium: 8.3 mg/dL — ABNORMAL LOW (ref 8.9–10.3)
Chloride: 105 mmol/L (ref 98–111)
Creatinine, Ser: 6.6 mg/dL — ABNORMAL HIGH (ref 0.44–1.00)
GFR, Estimated: 7 mL/min — ABNORMAL LOW (ref >60)
Glucose, Bld: 71 mg/dL (ref 70–99)
Potassium: 4.2 mmol/L (ref 3.5–5.1)
Sodium: 137 mmol/L (ref 135–145)
Total Bilirubin: 0.4 mg/dL (ref 0.0–1.2)
Total Protein: 5.4 g/dL — ABNORMAL LOW (ref 6.5–8.1)

## 2023-09-30 LAB — RENAL FUNCTION PANEL
Albumin: 1.8 g/dL — ABNORMAL LOW (ref 3.5–5.0)
Anion gap: 14 (ref 5–15)
BUN: 78 mg/dL — ABNORMAL HIGH (ref 6–20)
CO2: 18 mmol/L — ABNORMAL LOW (ref 22–32)
Calcium: 8.4 mg/dL — ABNORMAL LOW (ref 8.9–10.3)
Chloride: 105 mmol/L (ref 98–111)
Creatinine, Ser: 6.66 mg/dL — ABNORMAL HIGH (ref 0.44–1.00)
GFR, Estimated: 7 mL/min — ABNORMAL LOW (ref >60)
Glucose, Bld: 71 mg/dL (ref 70–99)
Phosphorus: 7.9 mg/dL — ABNORMAL HIGH (ref 2.5–4.6)
Potassium: 4.2 mmol/L (ref 3.5–5.1)
Sodium: 137 mmol/L (ref 135–145)

## 2023-09-30 LAB — MAGNESIUM: Magnesium: 2.1 mg/dL (ref 1.7–2.4)

## 2023-09-30 LAB — PHOSPHORUS: Phosphorus: 8 mg/dL — ABNORMAL HIGH (ref 2.5–4.6)

## 2023-09-30 MED ORDER — FUROSEMIDE 10 MG/ML IJ SOLN
160.0000 mg | Freq: Four times a day (QID) | INTRAVENOUS | Status: AC
  Administered 2023-09-30 (×2): 160 mg via INTRAVENOUS
  Filled 2023-09-30 (×2): qty 10

## 2023-09-30 MED ORDER — SODIUM CHLORIDE 0.9 % IV SOLN: 20.0000 ug | Freq: Once | INTRAVENOUS | Status: DC

## 2023-09-30 NOTE — Progress Notes (Signed)
 [facility] Progress Note   Subjective:   O2 sat fine on RA resting but dropped to 82% on ambulation yesterday. No uremic symptoms.  I/Os recorded 1.2 / 2.6L, net neg 4.8 for admit. No new complaints.  Objective Vitals:   09/30/23 0322 09/30/23 0500 09/30/23 0933 09/30/23 0937  BP: (!) 147/78  (!) 146/78 (!) 146/78  Pulse: 73  76   Resp: 18     Temp: (!) 97.5 F (36.4 C)     TempSrc: Oral     SpO2: 98%     Weight:  (!) 140.8 kg    Height:       Physical Exam General: obese woman comfortable at rest Heart: RRR no rub Lungs: rales to mid fields Abdomen: soft obese Extremities: no pedal edema Dialysis Access: RUE AVF +t/b immature  Additional Objective Labs: Basic Metabolic Panel: Recent Labs  Lab 09/29/23 0513 09/30/23 0516 09/30/23 0517  NA 138 137 137  K 4.2 4.2 4.2  CL 104 105 105  CO2 20* 17* 18*  GLUCOSE 80 71 71  BUN 68* 77* 78*  CREATININE 6.48* 6.60* 6.66*  CALCIUM 8.4* 8.3* 8.4*  PHOS 7.6* 8.0* 7.9*   Liver Function Tests: Recent Labs  Lab 09/27/23 1437 09/28/23 0516 09/29/23 0512 09/29/23 0513 09/30/23 0516 09/30/23 0517  AST 14*  --  12*  --  12*  --   ALT 7  --  7  --  8  --   ALKPHOS 46  --  38  --  37*  --   BILITOT 0.4  --  0.5  --  0.4  --   PROT 6.1*  --  5.4*  --  5.4*  --   ALBUMIN 1.8*   < > 1.7* 1.7* 1.8* 1.8*   < > = values in this interval not displayed.   No results for input(s): "LIPASE", "AMYLASE" in the last 168 hours. CBC: Recent Labs  Lab 09/26/23 1111 09/27/23 1126 09/27/23 1437 09/28/23 0516 09/29/23 0512 09/30/23 0516  WBC 10.3 7.7 7.5 6.0 6.4 7.1  NEUTROABS 8.3*  --   --   --  3.9 4.5  HGB 8.5* 8.8* 8.9* 8.5* 7.6* 8.3*  HCT 28.8* 29.8* 29.8* 27.8* 24.1* 27.1*  MCV 86.0 85.4 84.2 82.7 82.5 82.4  PLT 218 236 245 237 249 238   Blood Culture No results found for: "SDES", "SPECREQUEST", "CULT", "REPTSTATUS"  Cardiac Enzymes: No results for input(s): "CKTOTAL", "CKMB", "CKMBINDEX", "TROPONINI"  in the last 168 hours. CBG: Recent Labs  Lab 09/27/23 1642 09/27/23 2052 09/28/23 0802 09/28/23 1235 09/28/23 1613  GLUCAP 137* 137* 90 109* 103*   Iron Studies:  No results for input(s): "IRON", "TIBC", "TRANSFERRIN", "FERRITIN" in the last 72 hours.  @lablastinr3 @ Studies/Results: DG CHEST PORT 1 VIEW Result Date: 09/30/2023 CLINICAL DATA:  Shortness of breath. EXAM: PORTABLE CHEST 1 VIEW COMPARISON:  September 29, 2023. FINDINGS: Stable cardiomegaly with mild central pulmonary vascular congestion. Minimal left mid lung atelectasis is noted. Bony thorax is unremarkable. IMPRESSION: Stable cardiomegaly with mild central pulmonary vascular congestion. Minimal left midlung atelectasis. Electronically Signed   By: Lupita Raider M.D.   On: 09/30/2023 08:19   DG CHEST PORT 1 VIEW Result Date: 09/29/2023 CLINICAL DATA:  Shortness of breath. EXAM: PORTABLE CHEST 1 VIEW COMPARISON:  September 26, 2023. FINDINGS: Stable cardiomegaly with central pulmonary vascular congestion and possible bilateral pulmonary edema. Minimal left midlung subsegmental atelectasis is noted. Bony thorax is unremarkable. IMPRESSION: Stable cardiomegaly with central  pulmonary vascular congestion and possible bilateral pulmonary edema. Electronically Signed   By: Lupita Raider M.D.   On: 09/29/2023 11:12   Medications:  furosemide     iron sucrose 200 mg (09/29/23 1014)    apixaban  5 mg Oral BID   atorvastatin  80 mg Oral Daily   calcitRIOL  0.5 mcg Oral Daily   citalopram  30 mg Oral Daily   ferrous sulfate  325 mg Oral Daily   hydrALAZINE  100 mg Oral TID   loratadine  10 mg Oral Daily   metoprolol succinate  100 mg Oral Daily   pantoprazole  40 mg Oral Daily   sodium chloride flush  3 mL Intravenous Q12H   Assessment/PlanTyneika S Greening is an 48 y.o. female with DM, A fib, GERD, HTN, obesity, CKD 5, HFpEF currently admitted with hypervolemia and nephrology is consulted for assistance in management of CKD and  diuresis.     **AHRF in setting of A/C HFpEF:  Being diuresed, slowly improving but still fairly hypoxic with abulation 4/3.  CXR this AM still with congestion.  Rec cont IV diuresis.  Low na/fluid restricted diet.  Strict I/Os.    **CKD V: Baseline advanced CKD making plans for HD.  F/b Dr. Thedore Mins outpt. No current indications for RRT but will need to be followed closely.   Cont daily labs.    **Anemia:  Hb 8s stable c/w recent priors.  Iron sat < 20%, Hb 8.5 > 7.5.  Giving IV iron and dosed of ESA 4/3. (Aranesp 60)   **Secondary hyperparathyroidism:  phos 6.8, Corr Ca ok.  PTH 08/2023 185.  Renal diet.    **HTN: resumed home meds, follow with diuresis - improving, 133 overnight..    *A fib: on eliquis and BB.    **Obesity: BMI 51.   Will follow, reach out w concerns.Marland Kitchenof note:  Has f/u 4/21 with Dr. Thedore Mins at [facility] office.   Estill Bakes MD 09/30/2023, 10:05 AM   [facility] Pager: [phone] \

## 2023-09-30 NOTE — Progress Notes (Signed)
SATURATION QUALIFICATIONS: (This note is used to comply with regulatory documentation for home oxygen)  Patient Saturations on Room Air at Rest = 87%  Patient Saturations on Room Air while Ambulating = 85%  Patient Saturations on 2 Liters of oxygen while Ambulating = 93%  Please briefly explain why patient needs home oxygen: 

## 2023-09-30 NOTE — Progress Notes (Signed)
 PROGRESS NOTE    Melissa CLAUDE  [name]:[id number] DOB: October 26, 1975 DOA: 09/26/2023 PCP: Shelby Dubin, FNP   Brief Narrative:  The patient is a 48 year old female with CKD stage V s/p right arm fistula, not on hemodialysis yet,  HTN, hyperlipidemia, A-fib on Eliquis, obesity presented to ED with shortness of breath, worse with exertion or ambulation, was found to be hypoxic in ED.  Admitted for Acute on Chronic Diastolic CHF in the setting of CKD Stage 5 and Nephrology consulted and diuretics are being increased to IV Lasix 160 mg BID.  She desaturated on ambulatory home O2 screen yesterday and CXR still has congestion so will continue diuresis today.   Assessment and Plan:  Acute on Chronic Diastolic CHF (congestive heart failure) (HCC) in the setting of CKD stage V, volume overload -Presented with significant shortness of breath, DOE, pulmonary edema on chest x-ray, BNP 274 -Initially Started on high-dose IV Lasix 120 mg every 8 hours but did not have a great response so Nephrology increased to 160 mg twice daily x 2 Doses (this AM -Strict I's and O's and Daily Weights;  Intake/Output Summary (Last 24 hours) at 09/30/2023 1431 Last data filed at 09/30/2023 0941 Gross per 24 hour  Intake 1353.01 ml  Output 2300 ml  Net -946.99 ml  -2D echo 06/2023 had shown EF of 55 to 60%, G1 DD -Appreciate Nephrology Recc's and they are recommending c/w Diuresis today and daily labs; Has F/U w/ Dr. Thedore Mins at [facility] on 10/17/23  Acute Respiratory Failure with Hypoxia -Initially was hypoxic on presentation and was weaned off of supplemental oxygen via nasal cannula and had O2 saturation of 99%.  Yesterday desaturated quite a bit on ambulation but had normal O2 sats on room air. SpO2: 99 %; O2 Flow Rate (L/min): 2 L/min Resume diuresis given her continued respiratory failure. Continue with a flutter valve and incentive spirometer. If remains persistently SOB may consider further imaging.  Repeat ambulatory  home O2 screen prior to D/C. Repeat CXR this AM showed that the patient has stable cardiomegaly with central mild pulmonary vascular congestion and minimal left atelectasis in the midlung  CKD (chronic kidney disease) Stage 5 / Metabolic Acidosis GFR less than 15 ml/min (HCC) with volume overload -BUN/Cr Trend: Recent Labs  Lab 09/27/23 1126 09/27/23 1437 09/28/23 0516 09/29/23 0512 09/29/23 0513 09/30/23 0516 09/30/23 0517  BUN 59* 60* 63* 69* 68* 77* 78*  CREATININE 6.15* 6.18* 6.23* 6.43* 6.48* 6.60* 6.66*  -C/w IV Lasix for Diuresis and Nephrology Consulted and after she desaturated; Case was discussed again with Nephrology  who recommends resuming diuresis w/ IV Lasix 160 mg this evening and anticipating discharging on 80 mg of po Torsemide twice daily. -Has a slight metabolic acidosis with a CO2 of 18, anion gap of 14, chloride level of 105 -C/w Calcitriol 0.5 mcg po Daily -Avoid Nephrotoxic Medications, Contrast Dyes, Hypotension and Dehydration to Ensure Adequate Renal Perfusion and will need to Renally Adjust Meds -Continue to Monitor and Trend Renal Function carefully and repeat CMP in the AM   Atrial Fibrillation, chronic (HCC): C/w Metoprolol Succinate 100 mg po daily and A/C with Apixaban 5 mg po BID. Rate controlled. CTM and Trend on Telemetry    Essential HTN: Currently on high-dose IV Lasix for diuresis (receiving IV 160 mg Lasix BID again today). CTM BP per Protocol. Last BP reading was 169/72. Resume home Hydralazine 100 mg po q8h. CTM BP per Protocol. Last BP reading was 146/78  HLD:  C/w Atorvastatin 80 mg po Daily  Depression and Anxiety: C/w Citalopram 30 mg po Daily  Normocytic Anemia/Anemia of Chronic Kidney Disease: B/L is around 7-7.9. Hgb/Hct went from 8.8/29.8 -> 8.9/29.8 -> 8.5/27.8 -> 7.6/24.1 -> 8.3/27.1.  Anemia panel showed iron 40, TIBC 242, percent saturation show 17, ferritin 85, folate 11.4, B12 383. Getting IV Iron per Nephrology and continuing po  Ferrous Sulfate tablet 325 mg po Daily.  Also receiving a dose of ESA.  CTM for S/Sx of Bleeding; No overt bleeding noted. Repeat CBC in the AM   GERD/GI Prophylaxis: C/w Pantoprazole 40 mg po Daily  Hypoalbuminemia: Patient's Albumin now 1.8. CTM and Trend and repeat CMP in the AM  Class III (Super Morbid) Obesity: Complicates overall prognosis and care. Estimated body mass index is 51.64 kg/m as calculated from the following:   Height as of this encounter: 5\' 5"  (1.651 m).   Weight as of this encounter: 140.8 kg. Weight Loss and Dietary Counseling given   DVT prophylaxis: SCDs Start: 09/27/23 1319 apixaban (ELIQUIS) tablet 5 mg    Code Status: Full Code Family Communication: No family present at bedside  Disposition Plan:  Level of care: Progressive Status is: Inpatient Remains inpatient appropriate because: Needs Further clinical improvement and clearance by the specialists   Consultants:  Nephrology  Procedures:  As delineated as above  Antimicrobials:  Anti-infectives (From admission, onward)    None       Subjective: Seen and examined at bedside thinks her breathing is slowly improving but continues to still be on supplemental oxygen.  No nausea or vomiting.  Back on supplemental oxygen today.  No other concerns or complaints at this time.  Objective: Vitals:   09/30/23 0322 09/30/23 0500 09/30/23 0933 09/30/23 0937  BP: (!) 147/78  (!) 146/78 (!) 146/78  Pulse: 73  76 75  Resp: 18   18  Temp: (!) 97.5 F (36.4 C)     TempSrc: Oral     SpO2: 98%   99%  Weight:  (!) 140.8 kg    Height:        Intake/Output Summary (Last 24 hours) at 09/30/2023 1435 Last data filed at 09/30/2023 0941 Gross per 24 hour  Intake 1353.01 ml  Output 2300 ml  Net -946.99 ml   Filed Weights   09/28/23 0523 09/29/23 0500 09/30/23 0500  Weight: (!) 140.5 kg (!) 139.8 kg (!) 140.8 kg   Examination: Physical Exam:  Constitutional: WN/WD super morbidly obese African-American  female in no acute distress but is wearing supplemental oxygen via nasal cannula again Respiratory: Diminished to auscultation bilaterally with some coarse breath sounds and does have some slight crackles but no appreciable wheezing, rales or rhonchi.  Wearing 2 L supplemental oxygen via nasal cannula but has unlabored breathing Cardiovascular: RRR, no murmurs / rubs / gallops. S1 and S2 auscultated.  1+ lower extremity pitting edema Abdomen: Soft, non-tender, distended secondary to body habitus. Bowel sounds positive.  GU: Deferred. Musculoskeletal: No clubbing / cyanosis of digits/nails. No joint deformity upper and lower extremities. Skin: No rashes, lesions, ulcers on limited skin evaluation. No induration; Warm and dry.  Neurologic: CN 2-12 grossly intact with no focal deficits. Romberg sign and cerebellar reflexes not assessed.  Psychiatric: Normal judgment and insight. Alert and oriented x 3. Normal mood and appropriate affect.   Data Reviewed: I have personally reviewed following labs and imaging studies  CBC: Recent Labs  Lab 09/26/23 1111 09/27/23 1126 09/27/23 1437 09/28/23 0516 09/29/23 1610  09/30/23 0516  WBC 10.3 7.7 7.5 6.0 6.4 7.1  NEUTROABS 8.3*  --   --   --  3.9 4.5  HGB 8.5* 8.8* 8.9* 8.5* 7.6* 8.3*  HCT 28.8* 29.8* 29.8* 27.8* 24.1* 27.1*  MCV 86.0 85.4 84.2 82.7 82.5 82.4  PLT 218 236 245 237 249 238   Basic Metabolic Panel: Recent Labs  Lab 09/26/23 2034 09/27/23 1126 09/27/23 1437 09/28/23 0516 09/29/23 0512 09/29/23 0513 09/30/23 0516 09/30/23 0517  NA  --    < > 140 138 137 138 137 137  K  --    < > 4.5 4.2 4.1 4.2 4.2 4.2  CL  --    < > 111 110 104 104 105 105  CO2  --    < > 18* 18* 20* 20* 17* 18*  GLUCOSE  --    < > 119* 96 80 80 71 71  BUN  --    < > 60* 63* 69* 68* 77* 78*  CREATININE  --    < > 6.18* 6.23* 6.43* 6.48* 6.60* 6.66*  CALCIUM  --    < > 8.5* 8.3* 8.3* 8.4* 8.3* 8.4*  MG 2.2  --  2.3  --  2.2  --  2.1  --   PHOS 6.8*  --   6.5* 7.2* 7.7* 7.6* 8.0* 7.9*   < > = values in this interval not displayed.   GFR: Estimated Creatinine Clearance: 14.9 mL/min (A) (by C-G formula based on SCr of 6.66 mg/dL (H)). Liver Function Tests: Recent Labs  Lab 09/26/23 1111 09/27/23 1437 09/28/23 0516 09/29/23 0512 09/29/23 0513 09/30/23 0516 09/30/23 0517  AST 15 14*  --  12*  --  12*  --   ALT 7 7  --  7  --  8  --   ALKPHOS 46 46  --  38  --  37*  --   BILITOT 0.6 0.4  --  0.5  --  0.4  --   PROT 6.0* 6.1*  --  5.4*  --  5.4*  --   ALBUMIN 1.8* 1.8* 1.7* 1.7* 1.7* 1.8* 1.8*   No results for input(s): "LIPASE", "AMYLASE" in the last 168 hours. No results for input(s): "AMMONIA" in the last 168 hours. Coagulation Profile: No results for input(s): "INR", "PROTIME" in the last 168 hours. Cardiac Enzymes: No results for input(s): "CKTOTAL", "CKMB", "CKMBINDEX", "TROPONINI" in the last 168 hours. BNP (last 3 results) No results for input(s): "PROBNP" in the last 8760 hours. HbA1C: No results for input(s): "HGBA1C" in the last 72 hours. CBG: Recent Labs  Lab 09/27/23 1642 09/27/23 2052 09/28/23 0802 09/28/23 1235 09/28/23 1613  GLUCAP 137* 137* 90 109* 103*   Lipid Profile: No results for input(s): "CHOL", "HDL", "LDLCALC", "TRIG", "CHOLHDL", "LDLDIRECT" in the last 72 hours. Thyroid Function Tests: No results for input(s): "TSH", "T4TOTAL", "FREET4", "T3FREE", "THYROIDAB" in the last 72 hours. Anemia Panel: No results for input(s): "VITAMINB12", "FOLATE", "FERRITIN", "TIBC", "IRON", "RETICCTPCT" in the last 72 hours. Sepsis Labs: Recent Labs  Lab 09/26/23 1312 09/27/23 0037  LATICACIDVEN 0.5 0.5   Recent Results (from the past 240 hours)  Resp panel by RT-PCR (RSV, Flu A&B, Covid) Anterior Nasal Swab     Status: None   Collection Time: 09/26/23 11:40 AM   Specimen: Anterior Nasal Swab  Result Value Ref Range Status   SARS Coronavirus 2 by RT PCR NEGATIVE NEGATIVE Final   Influenza A by PCR NEGATIVE  NEGATIVE Final  Influenza B by PCR NEGATIVE NEGATIVE Final    Comment: (NOTE) The Xpert Xpress SARS-CoV-2/FLU/RSV plus assay is intended as an aid in the diagnosis of influenza from Nasopharyngeal swab specimens and should not be used as a sole basis for treatment. Nasal washings and aspirates are unacceptable for Xpert Xpress SARS-CoV-2/FLU/RSV testing.  Fact Sheet for Patients: BloggerCourse.com  Fact Sheet for Healthcare Providers: SeriousBroker.it  This test is not yet approved or cleared by the [place] FDA and has been authorized for detection and/or diagnosis of SARS-CoV-2 by FDA under an Emergency Use Authorization (EUA). This EUA will remain in effect (meaning this test can be used) for the duration of the COVID-19 declaration under Section 564(b)(1) of the Act, 21 U.S.C. section 360bbb-3(b)(1), unless the authorization is terminated or revoked.     Resp Syncytial Virus by PCR NEGATIVE NEGATIVE Final    Comment: (NOTE) Fact Sheet for Patients: BloggerCourse.com  Fact Sheet for Healthcare Providers: SeriousBroker.it  This test is not yet approved or cleared by the [place] FDA and has been authorized for detection and/or diagnosis of SARS-CoV-2 by FDA under an Emergency Use Authorization (EUA). This EUA will remain in effect (meaning this test can be used) for the duration of the COVID-19 declaration under Section 564(b)(1) of the Act, 21 U.S.C. section 360bbb-3(b)(1), unless the authorization is terminated or revoked.  Performed at [facility], [address], [place], [place] [address]     Radiology Studies: DG CHEST PORT 1 VIEW Result Date: 09/30/2023 CLINICAL DATA:  Shortness of breath. EXAM: PORTABLE CHEST 1 VIEW COMPARISON:  September 29, 2023. FINDINGS: Stable cardiomegaly with mild central pulmonary vascular congestion. Minimal left mid lung  atelectasis is noted. Bony thorax is unremarkable. IMPRESSION: Stable cardiomegaly with mild central pulmonary vascular congestion. Minimal left midlung atelectasis. Electronically Signed   By: Lupita Raider M.D.   On: 09/30/2023 08:19   DG CHEST PORT 1 VIEW Result Date: 09/29/2023 CLINICAL DATA:  Shortness of breath. EXAM: PORTABLE CHEST 1 VIEW COMPARISON:  September 26, 2023. FINDINGS: Stable cardiomegaly with central pulmonary vascular congestion and possible bilateral pulmonary edema. Minimal left midlung subsegmental atelectasis is noted. Bony thorax is unremarkable. IMPRESSION: Stable cardiomegaly with central pulmonary vascular congestion and possible bilateral pulmonary edema. Electronically Signed   By: Lupita Raider M.D.   On: 09/29/2023 11:12   Scheduled Meds:  apixaban  5 mg Oral BID   atorvastatin  80 mg Oral Daily   calcitRIOL  0.5 mcg Oral Daily   citalopram  30 mg Oral Daily   ferrous sulfate  325 mg Oral Daily   hydrALAZINE  100 mg Oral TID   loratadine  10 mg Oral Daily   metoprolol succinate  100 mg Oral Daily   pantoprazole  40 mg Oral Daily   sodium chloride flush  3 mL Intravenous Q12H   Continuous Infusions:  furosemide 160 mg (09/30/23 1128)    LOS: 4 days   Marguerita Merles, DO [facility] Available via Epic secure chat 7am-7pm After these hours, please refer to coverage provider listed on [email or web address] 09/30/2023, 2:35 PM

## 2023-10-01 ENCOUNTER — Inpatient Hospital Stay (HOSPITAL_COMMUNITY)

## 2023-10-01 DIAGNOSIS — I5033 Acute on chronic diastolic (congestive) heart failure: Secondary | ICD-10-CM | POA: Diagnosis not present

## 2023-10-01 DIAGNOSIS — I482 Chronic atrial fibrillation, unspecified: Secondary | ICD-10-CM | POA: Diagnosis not present

## 2023-10-01 DIAGNOSIS — N189 Chronic kidney disease, unspecified: Secondary | ICD-10-CM | POA: Diagnosis not present

## 2023-10-01 DIAGNOSIS — J9601 Acute respiratory failure with hypoxia: Secondary | ICD-10-CM | POA: Diagnosis not present

## 2023-10-01 LAB — CBC WITH DIFFERENTIAL/PLATELET
Abs Immature Granulocytes: 0.08 10*3/uL — ABNORMAL HIGH (ref 0.00–0.07)
Basophils Absolute: 0 10*3/uL (ref 0.0–0.1)
Basophils Relative: 0 %
Eosinophils Absolute: 0.4 10*3/uL (ref 0.0–0.5)
Eosinophils Relative: 5 %
HCT: 25.8 % — ABNORMAL LOW (ref 36.0–46.0)
Hemoglobin: 8.3 g/dL — ABNORMAL LOW (ref 12.0–15.0)
Immature Granulocytes: 1 %
Lymphocytes Relative: 18 %
Lymphs Abs: 1.4 10*3/uL (ref 0.7–4.0)
MCH: 26.3 pg (ref 26.0–34.0)
MCHC: 32.2 g/dL (ref 30.0–36.0)
MCV: 81.9 fL (ref 80.0–100.0)
Monocytes Absolute: 0.8 10*3/uL (ref 0.1–1.0)
Monocytes Relative: 11 %
Neutro Abs: 5.2 10*3/uL (ref 1.7–7.7)
Neutrophils Relative %: 65 %
Platelets: 263 10*3/uL (ref 150–400)
RBC: 3.15 MIL/uL — ABNORMAL LOW (ref 3.87–5.11)
RDW: 17.2 % — ABNORMAL HIGH (ref 11.5–15.5)
WBC: 7.9 10*3/uL (ref 4.0–10.5)
nRBC: 0.3 % — ABNORMAL HIGH (ref 0.0–0.2)

## 2023-10-01 LAB — BRAIN NATRIURETIC PEPTIDE: B Natriuretic Peptide: 364.2 pg/mL — ABNORMAL HIGH (ref 0.0–100.0)

## 2023-10-01 LAB — COMPREHENSIVE METABOLIC PANEL WITH GFR
ALT: 9 U/L (ref 0–44)
AST: 15 U/L (ref 15–41)
Albumin: 1.8 g/dL — ABNORMAL LOW (ref 3.5–5.0)
Alkaline Phosphatase: 39 U/L (ref 38–126)
Anion gap: 11 (ref 5–15)
BUN: 77 mg/dL — ABNORMAL HIGH (ref 6–20)
CO2: 19 mmol/L — ABNORMAL LOW (ref 22–32)
Calcium: 8.7 mg/dL — ABNORMAL LOW (ref 8.9–10.3)
Chloride: 107 mmol/L (ref 98–111)
Creatinine, Ser: 6.93 mg/dL — ABNORMAL HIGH (ref 0.44–1.00)
GFR, Estimated: 7 mL/min — ABNORMAL LOW (ref >60)
Glucose, Bld: 88 mg/dL (ref 70–99)
Potassium: 4.1 mmol/L (ref 3.5–5.1)
Sodium: 137 mmol/L (ref 135–145)
Total Bilirubin: 0.4 mg/dL (ref 0.0–1.2)
Total Protein: 5.5 g/dL — ABNORMAL LOW (ref 6.5–8.1)

## 2023-10-01 LAB — MAGNESIUM: Magnesium: 2.1 mg/dL (ref 1.7–2.4)

## 2023-10-01 LAB — PHOSPHORUS: Phosphorus: 8.4 mg/dL — ABNORMAL HIGH (ref 2.5–4.6)

## 2023-10-01 MED ORDER — FUROSEMIDE 10 MG/ML IJ SOLN
160.0000 mg | Freq: Once | INTRAVENOUS | Status: AC
  Administered 2023-10-01: 160 mg via INTRAVENOUS
  Filled 2023-10-01: qty 16

## 2023-10-01 NOTE — Plan of Care (Signed)
   Problem: Clinical Measurements: Goal: Respiratory complications will improve Outcome: Progressing Goal: Cardiovascular complication will be avoided Outcome: Progressing   Problem: Activity: Goal: Risk for activity intolerance will decrease Outcome: Progressing

## 2023-10-01 NOTE — Progress Notes (Signed)
 PROGRESS NOTE    Melissa [name]  VWU:[id number] DOB: [birth year] DOA: 09/26/2023 PCP: Shelby Dubin, FNP   Brief Narrative:  The patient is a 48 year old female with CKD stage V s/p right arm fistula, not on hemodialysis yet,  HTN, hyperlipidemia, A-fib on Eliquis, obesity presented to ED with shortness of breath, worse with exertion or ambulation, was found to be hypoxic in ED.  Admitted for Acute on Chronic Diastolic CHF in the setting of CKD Stage 5 and Nephrology consulted and diuretics are being increased to IV Lasix 160 mg BID.  She desaturated on ambulatory home O2 screen again today but chest x-ray is improving.  Nephrology recommends continue IV diuresis for at least 1 more day.  Assessment and Plan:  Acute on Chronic Diastolic CHF (congestive heart failure) (HCC) in the setting of CKD stage V, volume overload -Presented with significant shortness of breath, DOE, pulmonary edema on chest x-ray, BNP 274 -Initially Started on high-dose IV Lasix 120 mg every 8 hours but did not have a great response so Nephrology increased to 160 mg twice daily and this is now being continued -Strict I's and O's and Daily Weights;  Intake/Output Summary (Last 24 hours) at 10/01/2023 1522 Last data filed at 10/01/2023 1050 Gross per 24 hour  Intake 797.13 ml  Output 1700 ml  Net -902.87 ml  -2D echo 06/2023 had shown EF of 55 to 60%, G1 DD -Appreciate Nephrology Recc's and they are recommending c/w Diuresis today given that she still hypoxic on ambulation; Has F/U w/ Dr. Thedore Mins at [facility] on 10/17/23  Acute Respiratory Failure with Hypoxia -Initially was hypoxic on presentation and was weaned off of supplemental oxygen via nasal cannula and had O2 saturation of 99%.  Continues to intermittently desaturate and desaturated on home O2 screen today.  SpO2: 95 %; O2 Flow Rate (L/min): 2 L/min Nephrology recommends continuing diuresis and keeping her another day for IV diuresis.  Repeat ambulatory home O2  screen prior to D/C. Repeat CXR this AM showed the heart was in the upper limits of normal in size and there is no confluent airspace opacities, effusions or edema noted no acute bony abnormalities.  CKD (chronic kidney disease) Stage 5 / Metabolic Acidosis GFR less than 15 ml/min (HCC) with volume overload -BUN/Cr Trend: Recent Labs  Lab 09/27/23 1437 09/28/23 0516 09/29/23 0512 09/29/23 0513 09/30/23 0516 09/30/23 0517 10/01/23 0454  BUN 60* 63* 69* 68* 77* 78* 77*  CREATININE 6.18* 6.23* 6.43* 6.48* 6.60* 6.66* 6.93*  -C/w IV Lasix for Diuresis and Nephrology Consulted and after she desaturated; C/w diuresis w/ IV Lasix 160 mg BID and anticipating discharging on 80 mg of po Torsemide twice daily. -Has a slight metabolic acidosis with a CO2 of 19, anion gap of 11, chloride level of 107 -C/w Calcitriol 0.5 mcg po Daily -Avoid Nephrotoxic Medications, Contrast Dyes, Hypotension and Dehydration to Ensure Adequate Renal Perfusion and will need to Renally Adjust Meds -Continue to Monitor and Trend Renal Function carefully and repeat CMP in the AM   Atrial Fibrillation, chronic (HCC): C/w Metoprolol Succinate 100 mg po daily and A/C with Apixaban 5 mg po BID. Rate controlled. CTM and Trend on Telemetry    Essential HTN: Currently on high-dose IV Lasix for diuresis (receiving IV 160 mg Lasix BID again today). CTM BP per Protocol. Last BP reading was 169/72. Resume home Hydralazine 100 mg po q8h. CTM BP per Protocol. Last BP reading was 163/84  HLD: C/w Atorvastatin 80 mg po  Daily  Depression and Anxiety: C/w Citalopram 30 mg po Daily  Normocytic Anemia/Anemia of Chronic Kidney Disease: B/L is around 7-7.9. Hgb/Hct went from 8.8/29.8 -> 8.9/29.8 -> 8.5/27.8 -> 7.6/24.1 -> 8.3/27.1 -> 8.3/25.8.  Anemia panel showed iron 40, TIBC 242, percent saturation show 17, ferritin 85, folate 11.4, B12 383. Getting IV Iron per Nephrology and continuing po Ferrous Sulfate tablet 325 mg po Daily.  Also  received a dose of ESA.  CTM for S/Sx of Bleeding; No overt bleeding noted. Repeat CBC in the AM   GERD/GI Prophylaxis: C/w Pantoprazole 40 mg po Daily  Hypoalbuminemia: Patient's Albumin now 1.8 again. CTM and Trend and repeat CMP in the AM  Class III (Super Morbid) Obesity: Complicates overall prognosis and care. Estimated body mass index is 51.47 kg/m as calculated from the following:   Height as of this encounter: 5\' 5"  (1.651 m).   Weight as of this encounter: 140.3 kg. Weight Loss and Dietary Counseling given   DVT prophylaxis: SCDs Start: 09/27/23 1319 apixaban (ELIQUIS) tablet 5 mg    Code Status: Full Code Family Communication: No family currently at bedside  Disposition Plan:  Level of care: Progressive Status is: Inpatient Remains inpatient appropriate because: Continues to remain volume overloaded and continues to desaturate on amatory home O2 screen so nephrology recommends continue IV diuresis today   Consultants:  Nephrology  Procedures:  As delineated as above  Antimicrobials:  Anti-infectives (From admission, onward)    None       Subjective: Seen and examined at bedside and feels okay but states that she is not feeling much better.  No nausea or vomiting.  Off of supplemental oxygen today but desaturates on ambulatory home O2 screen and whenever she exerts herself.  Denies any other concerns or complaints at this time.  Objective: Vitals:   09/30/23 2023 10/01/23 0506 10/01/23 1202 10/01/23 1412  BP: (!) 161/80 (!) 183/87  (!) 163/84  Pulse: 75 70 68 79  Resp: 18 18  18   Temp: 98.1 F (36.7 C) 97.6 F (36.4 C)  97.8 F (36.6 C)  TempSrc: Oral Oral  Oral  SpO2: 96% 97% 93% 95%  Weight:  (!) 140.3 kg    Height:        Intake/Output Summary (Last 24 hours) at 10/01/2023 1523 Last data filed at 10/01/2023 1050 Gross per 24 hour  Intake 797.13 ml  Output 1700 ml  Net -902.87 ml   Filed Weights   09/29/23 0500 09/30/23 0500 10/01/23 0506  Weight:  (!) 139.8 kg (!) 140.8 kg (!) 140.3 kg   Examination: Physical Exam:  Constitutional: WN/WD super morbidly obese African-American female in no acute distress and is off of supplemental oxygen via nasal cannula today Respiratory: Diminished to auscultation bilaterally with some coarse breath sounds and does have some slight crackles but no appreciable wheezing, rales, rhonchi. Normal respiratory effort and patient is not tachypenic. No accessory muscle use.  Not wearing supplemental oxygen via nasal cannula today Cardiovascular: RRR, no murmurs / rubs / gallops. S1 and S2 auscultated.  Has 1-2+ lower extremity pitting edema Abdomen: Soft, non-tender, distended secondary body habitus. Bowel sounds positive.  GU: Deferred. Musculoskeletal: No clubbing / cyanosis of digits/nails. No joint deformity upper and lower extremities.  Skin: No rashes, lesions, ulcers on limited skin evaluation. No induration; Warm and dry.  Neurologic: CN 2-12 grossly intact with no focal deficits. Romberg sign and cerebellar reflexes not assessed.  Psychiatric: Normal judgment and insight. Alert and oriented x  3. Normal mood and appropriate affect.   Data Reviewed: I have personally reviewed following labs and imaging studies  CBC: Recent Labs  Lab 09/26/23 1111 09/27/23 1126 09/27/23 1437 09/28/23 0516 09/29/23 0512 09/30/23 0516 10/01/23 0454  WBC 10.3   < > 7.5 6.0 6.4 7.1 7.9  NEUTROABS 8.3*  --   --   --  3.9 4.5 5.2  HGB 8.5*   < > 8.9* 8.5* 7.6* 8.3* 8.3*  HCT 28.8*   < > 29.8* 27.8* 24.1* 27.1* 25.8*  MCV 86.0   < > 84.2 82.7 82.5 82.4 81.9  PLT 218   < > 245 237 249 238 263   < > = values in this interval not displayed.   Basic Metabolic Panel: Recent Labs  Lab 09/26/23 2034 09/27/23 1126 09/27/23 1437 09/28/23 0516 09/29/23 0512 09/29/23 0513 09/30/23 0516 09/30/23 0517 10/01/23 0454  NA  --    < > 140   < > 137 138 137 137 137  K  --    < > 4.5   < > 4.1 4.2 4.2 4.2 4.1  CL  --    <  > 111   < > 104 104 105 105 107  CO2  --    < > 18*   < > 20* 20* 17* 18* 19*  GLUCOSE  --    < > 119*   < > 80 80 71 71 88  BUN  --    < > 60*   < > 69* 68* 77* 78* 77*  CREATININE  --    < > 6.18*   < > 6.43* 6.48* 6.60* 6.66* 6.93*  CALCIUM  --    < > 8.5*   < > 8.3* 8.4* 8.3* 8.4* 8.7*  MG 2.2  --  2.3  --  2.2  --  2.1  --  2.1  PHOS 6.8*  --  6.5*   < > 7.7* 7.6* 8.0* 7.9* 8.4*   < > = values in this interval not displayed.   GFR: Estimated Creatinine Clearance: 14.3 mL/min (A) (by C-G formula based on SCr of 6.93 mg/dL (H)). Liver Function Tests: Recent Labs  Lab 09/26/23 1111 09/27/23 1437 09/28/23 0516 09/29/23 0512 09/29/23 0513 09/30/23 0516 09/30/23 0517 10/01/23 0454  AST 15 14*  --  12*  --  12*  --  15  ALT 7 7  --  7  --  8  --  9  ALKPHOS 46 46  --  38  --  37*  --  39  BILITOT 0.6 0.4  --  0.5  --  0.4  --  0.4  PROT 6.0* 6.1*  --  5.4*  --  5.4*  --  5.5*  ALBUMIN 1.8* 1.8*   < > 1.7* 1.7* 1.8* 1.8* 1.8*   < > = values in this interval not displayed.   No results for input(s): "LIPASE", "AMYLASE" in the last 168 hours. No results for input(s): "AMMONIA" in the last 168 hours. Coagulation Profile: No results for input(s): "INR", "PROTIME" in the last 168 hours. Cardiac Enzymes: No results for input(s): "CKTOTAL", "CKMB", "CKMBINDEX", "TROPONINI" in the last 168 hours. BNP (last 3 results) No results for input(s): "PROBNP" in the last 8760 hours. HbA1C: No results for input(s): "HGBA1C" in the last 72 hours. CBG: Recent Labs  Lab 09/27/23 1642 09/27/23 2052 09/28/23 0802 09/28/23 1235 09/28/23 1613  GLUCAP 137* 137* 90 109* 103*   Lipid Profile: No  results for input(s): "CHOL", "HDL", "LDLCALC", "TRIG", "CHOLHDL", "LDLDIRECT" in the last 72 hours. Thyroid Function Tests: No results for input(s): "TSH", "T4TOTAL", "FREET4", "T3FREE", "THYROIDAB" in the last 72 hours. Anemia Panel: No results for input(s): "VITAMINB12", "FOLATE", "FERRITIN",  "TIBC", "IRON", "RETICCTPCT" in the last 72 hours. Sepsis Labs: Recent Labs  Lab 09/26/23 1312 09/27/23 0037  LATICACIDVEN 0.5 0.5   Recent Results (from the past 240 hours)  Resp panel by RT-PCR (RSV, Flu A&B, Covid) Anterior Nasal Swab     Status: None   Collection Time: 09/26/23 11:40 AM   Specimen: Anterior Nasal Swab  Result Value Ref Range Status   SARS Coronavirus 2 by RT PCR NEGATIVE NEGATIVE Final   Influenza A by PCR NEGATIVE NEGATIVE Final   Influenza B by PCR NEGATIVE NEGATIVE Final    Comment: (NOTE) The Xpert Xpress SARS-CoV-2/FLU/RSV plus assay is intended as an aid in the diagnosis of influenza from Nasopharyngeal swab specimens and should not be used as a sole basis for treatment. Nasal washings and aspirates are unacceptable for Xpert Xpress SARS-CoV-2/FLU/RSV testing.  Fact Sheet for Patients: BloggerCourse.com  Fact Sheet for Healthcare Providers: SeriousBroker.it  This test is not yet approved or cleared by the [place] FDA and has been authorized for detection and/or diagnosis of SARS-CoV-2 by FDA under an Emergency Use Authorization (EUA). This EUA will remain in effect (meaning this test can be used) for the duration of the COVID-19 declaration under Section 564(b)(1) of the Act, 21 U.S.C. section 360bbb-3(b)(1), unless the authorization is terminated or revoked.     Resp Syncytial Virus by PCR NEGATIVE NEGATIVE Final    Comment: (NOTE) Fact Sheet for Patients: BloggerCourse.com  Fact Sheet for Healthcare Providers: SeriousBroker.it  This test is not yet approved or cleared by the [place] FDA and has been authorized for detection and/or diagnosis of SARS-CoV-2 by FDA under an Emergency Use Authorization (EUA). This EUA will remain in effect (meaning this test can be used) for the duration of the COVID-19 declaration under Section  564(b)(1) of the Act, 21 U.S.C. section 360bbb-3(b)(1), unless the authorization is terminated or revoked.  Performed at [facility], [address], [place], [place] [address]     Radiology Studies: DG CHEST PORT 1 VIEW Result Date: 10/01/2023 CLINICAL DATA:  Shortness of breath EXAM: PORTABLE CHEST 1 VIEW COMPARISON:  09/30/2023 FINDINGS: Heart upper limits normal in size. No confluent airspace opacities, effusions or edema. No acute bony abnormality. IMPRESSION: No active disease. Electronically Signed   By: Charlett Nose M.D.   On: 10/01/2023 10:11   DG CHEST PORT 1 VIEW Result Date: 09/30/2023 CLINICAL DATA:  Shortness of breath. EXAM: PORTABLE CHEST 1 VIEW COMPARISON:  September 29, 2023. FINDINGS: Stable cardiomegaly with mild central pulmonary vascular congestion. Minimal left mid lung atelectasis is noted. Bony thorax is unremarkable. IMPRESSION: Stable cardiomegaly with mild central pulmonary vascular congestion. Minimal left midlung atelectasis. Electronically Signed   By: Lupita Raider M.D.   On: 09/30/2023 08:19   Scheduled Meds:  apixaban  5 mg Oral BID   atorvastatin  80 mg Oral Daily   calcitRIOL  0.5 mcg Oral Daily   citalopram  30 mg Oral Daily   ferrous sulfate  325 mg Oral Daily   hydrALAZINE  100 mg Oral TID   loratadine  10 mg Oral Daily   metoprolol succinate  100 mg Oral Daily   pantoprazole  40 mg Oral Daily   sodium chloride flush  3  mL Intravenous Q12H   Continuous Infusions:   LOS: 5 days   Marguerita Merles, DO [facility] Available via Epic secure chat 7am-7pm After these hours, please refer to coverage provider listed on [email or web address] 10/01/2023, 3:23 PM

## 2023-10-01 NOTE — Progress Notes (Signed)
 SATURATION QUALIFICATIONS: (This note is used to comply with regulatory documentation for home oxygen)  Patient Saturations on Room Air at Rest = 90%  Patient Saturations on Room Air while Ambulating = 83%  Patient Saturations on 2 Liters of oxygen while Ambulating = 91%  Please briefly explain why patient needs home oxygen:

## 2023-10-01 NOTE — Progress Notes (Signed)
 [facility] Progress Note   Subjective:   O2 sat fine on RA resting but dropped to 84% on ambulation yesterday. No uremic symptoms.  I/Os recorded 1.1 / 2.7L, net neg 6.4 for admit. No new complaints.  CXR this AM read pending but looks better to me.   Objective Vitals:   09/30/23 1440 09/30/23 1712 09/30/23 2023 10/01/23 0506  BP: (!) 177/81 (!) 148/69 (!) 161/80 (!) 183/87  Pulse: 70  75 70  Resp: 18  18 18   Temp: 98 F (36.7 C)  98.1 F (36.7 C) 97.6 F (36.4 C)  TempSrc: Oral  Oral Oral  SpO2: 100%  96% 97%  Weight:    (!) 140.3 kg  Height:       Physical Exam General: obese woman comfortable at rest Heart: RRR no rub Lungs: clear Abdomen: soft obese Extremities: no pedal edema Dialysis Access: RUE AVF +t/b immature  Additional Objective Labs: Basic Metabolic Panel: Recent Labs  Lab 09/30/23 0516 09/30/23 0517 10/01/23 0454  NA 137 137 137  K 4.2 4.2 4.1  CL 105 105 107  CO2 17* 18* 19*  GLUCOSE 71 71 88  BUN 77* 78* 77*  CREATININE 6.60* 6.66* 6.93*  CALCIUM 8.3* 8.4* 8.7*  PHOS 8.0* 7.9* 8.4*   Liver Function Tests: Recent Labs  Lab 09/29/23 0512 09/29/23 0513 09/30/23 0516 09/30/23 0517 10/01/23 0454  AST 12*  --  12*  --  15  ALT 7  --  8  --  9  ALKPHOS 38  --  37*  --  39  BILITOT 0.5  --  0.4  --  0.4  PROT 5.4*  --  5.4*  --  5.5*  ALBUMIN 1.7*   < > 1.8* 1.8* 1.8*   < > = values in this interval not displayed.   No results for input(s): "LIPASE", "AMYLASE" in the last 168 hours. CBC: Recent Labs  Lab 09/27/23 1437 09/28/23 0516 09/29/23 0512 09/30/23 0516 10/01/23 0454  WBC 7.5 6.0 6.4 7.1 7.9  NEUTROABS  --   --  3.9 4.5 5.2  HGB 8.9* 8.5* 7.6* 8.3* 8.3*  HCT 29.8* 27.8* 24.1* 27.1* 25.8*  MCV 84.2 82.7 82.5 82.4 81.9  PLT 245 237 249 238 263   Blood Culture No results found for: "SDES", "SPECREQUEST", "CULT", "REPTSTATUS"  Cardiac Enzymes: No results for input(s): "CKTOTAL", "CKMB", "CKMBINDEX",  "TROPONINI" in the last 168 hours. CBG: Recent Labs  Lab 09/27/23 1642 09/27/23 2052 09/28/23 0802 09/28/23 1235 09/28/23 1613  GLUCAP 137* 137* 90 109* 103*   Iron Studies:  No results for input(s): "IRON", "TIBC", "TRANSFERRIN", "FERRITIN" in the last 72 hours.  @lablastinr3 @ Studies/Results: DG CHEST PORT 1 VIEW Result Date: 10/01/2023 CLINICAL DATA:  Shortness of breath EXAM: PORTABLE CHEST 1 VIEW COMPARISON:  09/30/2023 FINDINGS: Heart upper limits normal in size. No confluent airspace opacities, effusions or edema. No acute bony abnormality. IMPRESSION: No active disease. Electronically Signed   By: Charlett Nose M.D.   On: 10/01/2023 10:11   DG CHEST PORT 1 VIEW Result Date: 09/30/2023 CLINICAL DATA:  Shortness of breath. EXAM: PORTABLE CHEST 1 VIEW COMPARISON:  September 29, 2023. FINDINGS: Stable cardiomegaly with mild central pulmonary vascular congestion. Minimal left mid lung atelectasis is noted. Bony thorax is unremarkable. IMPRESSION: Stable cardiomegaly with mild central pulmonary vascular congestion. Minimal left midlung atelectasis. Electronically Signed   By: Lupita Raider M.D.   On: 09/30/2023 08:19   Medications:    apixaban  5 mg Oral BID   atorvastatin  80 mg Oral Daily   calcitRIOL  0.5 mcg Oral Daily   citalopram  30 mg Oral Daily   ferrous sulfate  325 mg Oral Daily   hydrALAZINE  100 mg Oral TID   loratadine  10 mg Oral Daily   metoprolol succinate  100 mg Oral Daily   pantoprazole  40 mg Oral Daily   sodium chloride flush  3 mL Intravenous Q12H   Assessment/PlanTyneika S [name] is an 48 y.o. female with DM, A fib, GERD, HTN, obesity, CKD 5, HFpEF currently admitted with hypervolemia and nephrology is consulted for assistance in management of CKD and diuresis.     **AHRF in setting of A/C HFpEF:  Being diuresed, slowly improving but still fairly hypoxic with abulation 4/3.  CXR this AM looks better but poss a bit on ongoing congestion. If hypoxic on ambulation  again today would cont IV diuresis 1 more day.  If ok would d/c with torsemide 80 BID with caveat increase to 100 BID if wt gain/edema/orthopnea.  Low na/fluid restricted diet.  Strict I/Os.    **CKD V: Baseline advanced CKD making plans for HD.  F/b Dr. Thedore Mins outpt. No current indications for RRT but will need to be followed closely.   Cont daily labs.    **Anemia:  Hb 8s stable c/w recent priors.  Iron sat < 20%, Hb 8.5 > 7.5.  Giving IV iron and dosed of ESA 4/3. (Aranesp 60)   **Secondary hyperparathyroidism:  phos 6.8, Corr Ca ok.  PTH 08/2023 185.  Renal diet.    **HTN: resumed home meds, follow with diuresis - improving.   *A fib: on eliquis and BB.    **Obesity: BMI 51.   Will follow, reach out w concerns.Marland Kitchenof note:  Has f/u 4/21 with Dr. Thedore Mins at [facility] office.   Estill Bakes MD 10/01/2023, 10:38 AM  [facility] Pager: [phone] \

## 2023-10-02 DIAGNOSIS — I5033 Acute on chronic diastolic (congestive) heart failure: Secondary | ICD-10-CM | POA: Diagnosis not present

## 2023-10-02 DIAGNOSIS — J9601 Acute respiratory failure with hypoxia: Secondary | ICD-10-CM | POA: Diagnosis not present

## 2023-10-02 DIAGNOSIS — N189 Chronic kidney disease, unspecified: Secondary | ICD-10-CM | POA: Diagnosis not present

## 2023-10-02 DIAGNOSIS — I482 Chronic atrial fibrillation, unspecified: Secondary | ICD-10-CM | POA: Diagnosis not present

## 2023-10-02 LAB — RENAL FUNCTION PANEL
Albumin: 1.9 g/dL — ABNORMAL LOW (ref 3.5–5.0)
Anion gap: 12 (ref 5–15)
BUN: 82 mg/dL — ABNORMAL HIGH (ref 6–20)
CO2: 18 mmol/L — ABNORMAL LOW (ref 22–32)
Calcium: 8.6 mg/dL — ABNORMAL LOW (ref 8.9–10.3)
Chloride: 106 mmol/L (ref 98–111)
Creatinine, Ser: 6.92 mg/dL — ABNORMAL HIGH (ref 0.44–1.00)
GFR, Estimated: 7 mL/min — ABNORMAL LOW (ref >60)
Glucose, Bld: 103 mg/dL — ABNORMAL HIGH (ref 70–99)
Phosphorus: 8.3 mg/dL — ABNORMAL HIGH (ref 2.5–4.6)
Potassium: 4 mmol/L (ref 3.5–5.1)
Sodium: 136 mmol/L (ref 135–145)

## 2023-10-02 MED ORDER — TORSEMIDE 20 MG PO TABS: 80.0000 mg | ORAL_TABLET | Freq: Two times a day (BID) | ORAL | 0 refills | Status: DC

## 2023-10-02 MED ORDER — ONDANSETRON HCL 4 MG PO TABS: 4.0000 mg | ORAL_TABLET | Freq: Four times a day (QID) | ORAL | 0 refills | Status: DC | PRN

## 2023-10-02 NOTE — Progress Notes (Signed)
 [facility] Progress Note   Subjective:   O2 sat fine on RA resting but dropped to 83% on ambulation yesterday. No uremic symptoms.  I/Os recorded 0.5 / 1.1L, net neg 7L for admit. No new complaints.    Objective Vitals:   10/01/23 1601 10/01/23 2107 10/01/23 2346 10/02/23 0424  BP: (!) 150/76 (!) 196/83 (!) 174/78 121/61  Pulse: 79 84 81 72  Resp:  16  20  Temp:  98.8 F (37.1 C)  (!) 97.5 F (36.4 C)  TempSrc:  Oral  Oral  SpO2: 97% 91% 99% 94%  Weight:    (!) 140.5 kg  Height:       Physical Exam General: obese woman comfortable at rest Heart: RRR no rub Lungs: clear Abdomen: soft obese Extremities: no pedal edema Dialysis Access: RUE AVF +t/b immature   Additional Objective Labs: Basic Metabolic Panel: Recent Labs  Lab 09/30/23 0517 10/01/23 0454 10/02/23 0414  NA 137 137 136  K 4.2 4.1 4.0  CL 105 107 106  CO2 18* 19* 18*  GLUCOSE 71 88 103*  BUN 78* 77* 82*  CREATININE 6.66* 6.93* 6.92*  CALCIUM 8.4* 8.7* 8.6*  PHOS 7.9* 8.4* 8.3*   Liver Function Tests: Recent Labs  Lab 09/29/23 0512 09/29/23 0513 09/30/23 0516 09/30/23 0517 10/01/23 0454 10/02/23 0414  AST 12*  --  12*  --  15  --   ALT 7  --  8  --  9  --   ALKPHOS 38  --  37*  --  39  --   BILITOT 0.5  --  0.4  --  0.4  --   PROT 5.4*  --  5.4*  --  5.5*  --   ALBUMIN 1.7*   < > 1.8* 1.8* 1.8* 1.9*   < > = values in this interval not displayed.   No results for input(s): "LIPASE", "AMYLASE" in the last 168 hours. CBC: Recent Labs  Lab 09/27/23 1437 09/28/23 0516 09/29/23 0512 09/30/23 0516 10/01/23 0454  WBC 7.5 6.0 6.4 7.1 7.9  NEUTROABS  --   --  3.9 4.5 5.2  HGB 8.9* 8.5* 7.6* 8.3* 8.3*  HCT 29.8* 27.8* 24.1* 27.1* 25.8*  MCV 84.2 82.7 82.5 82.4 81.9  PLT 245 237 249 238 263   Blood Culture No results found for: "SDES", "SPECREQUEST", "CULT", "REPTSTATUS"  Cardiac Enzymes: No results for input(s): "CKTOTAL", "CKMB", "CKMBINDEX", "TROPONINI" in the last 168  hours. CBG: Recent Labs  Lab 09/27/23 1642 09/27/23 2052 09/28/23 0802 09/28/23 1235 09/28/23 1613  GLUCAP 137* 137* 90 109* 103*   Iron Studies:  No results for input(s): "IRON", "TIBC", "TRANSFERRIN", "FERRITIN" in the last 72 hours.  @lablastinr3 @ Studies/Results: DG CHEST PORT 1 VIEW Result Date: 10/01/2023 CLINICAL DATA:  Shortness of breath EXAM: PORTABLE CHEST 1 VIEW COMPARISON:  09/30/2023 FINDINGS: Heart upper limits normal in size. No confluent airspace opacities, effusions or edema. No acute bony abnormality. IMPRESSION: No active disease. Electronically Signed   By: Charlett Nose M.D.   On: 10/01/2023 10:11   Medications:    apixaban  5 mg Oral BID   atorvastatin  80 mg Oral Daily   calcitRIOL  0.5 mcg Oral Daily   citalopram  30 mg Oral Daily   ferrous sulfate  325 mg Oral Daily   hydrALAZINE  100 mg Oral TID   loratadine  10 mg Oral Daily   metoprolol succinate  100 mg Oral Daily   pantoprazole  40 mg  Oral Daily   sodium chloride flush  3 mL Intravenous Q12H   Assessment/PlanTyneika S Gwynn is an 48 y.o. female with DM, A fib, GERD, HTN, obesity, CKD 5, HFpEF currently admitted with hypervolemia and nephrology is consulted for assistance in management of CKD and diuresis.     **AHRF in setting of A/C HFpEF:  Being diuresed, slowly improving but still fairly hypoxic with abulation 4/5.  CXR Sat AM looked improved and read no active dz - do not think she needs additional IV diuresis.  Will need ambulatory O2.  Would d/c with torsemide 80 BID with caveat increase to 100 BID if wt gain/edema/orthopnea.  Low na/fluid restricted diet.  Strict I/Os.    **CKD V: Baseline advanced CKD making plans for HD.  F/b Dr. Thedore Mins outpt - 4/21 appt. No current indications for RRT but will need to be followed closely.   Cont daily labs.    **Anemia:  Hb 8s stable c/w recent priors.  Iron sat < 20%, Hb 8.5 > 7.5.  Giving IV iron and dosed of ESA 4/3. (Aranesp 60)   **Secondary  hyperparathyroidism:  phos 6.8, Corr Ca ok.  PTH 08/2023 185.  Renal diet.    **HTN: resumed home meds, follow with diuresis - improving.   *A fib: on eliquis and BB.    **Obesity: BMI 51.   Will follow, reach out w concerns.Marland Kitchenof note:  Has f/u 4/21 with Dr. Thedore Mins at [facility] office.   Estill Bakes MD 10/02/2023, 8:35 AM  [facility] Pager: [phone] \

## 2023-10-02 NOTE — Plan of Care (Signed)
  Problem: Education: Goal: Knowledge of General Education information will improve Description: Including pain rating scale, medication(s)/side effects and non-pharmacologic comfort measures Outcome: Adequate for Discharge   Problem: Health Behavior/Discharge Planning: Goal: Ability to manage health-related needs will improve Outcome: Adequate for Discharge   Problem: Clinical Measurements: Goal: Ability to maintain clinical measurements within normal limits will improve Outcome: Adequate for Discharge Goal: Will remain free from infection Outcome: Adequate for Discharge Goal: Diagnostic test results will improve Outcome: Adequate for Discharge Goal: Respiratory complications will improve Outcome: Adequate for Discharge Goal: Cardiovascular complication will be avoided Outcome: Adequate for Discharge   Problem: Activity: Goal: Risk for activity intolerance will decrease Outcome: Adequate for Discharge   Problem: Nutrition: Goal: Adequate nutrition will be maintained Outcome: Adequate for Discharge   Problem: Coping: Goal: Level of anxiety will decrease Outcome: Adequate for Discharge   Problem: Elimination: Goal: Will not experience complications related to bowel motility Outcome: Adequate for Discharge Goal: Will not experience complications related to urinary retention Outcome: Adequate for Discharge   Problem: Pain Managment: Goal: General experience of comfort will improve and/or be controlled Outcome: Adequate for Discharge   Problem: Safety: Goal: Ability to remain free from injury will improve Outcome: Adequate for Discharge   Problem: Skin Integrity: Goal: Risk for impaired skin integrity will decrease Outcome: Adequate for Discharge   Problem: Education: Goal: Ability to demonstrate management of disease process will improve Outcome: Adequate for Discharge Goal: Ability to verbalize understanding of medication therapies will improve Outcome: Adequate  for Discharge Goal: Individualized Educational Video(s) Outcome: Adequate for Discharge   Problem: Activity: Goal: Capacity to carry out activities will improve Outcome: Adequate for Discharge   Problem: Cardiac: Goal: Ability to achieve and maintain adequate cardiopulmonary perfusion will improve Outcome: Adequate for Discharge   Problem: Education: Goal: Ability to demonstrate management of disease process will improve Outcome: Adequate for Discharge Goal: Ability to verbalize understanding of medication therapies will improve Outcome: Adequate for Discharge Goal: Individualized Educational Video(s) Outcome: Adequate for Discharge   Problem: Activity: Goal: Capacity to carry out activities will improve Outcome: Adequate for Discharge   Problem: Cardiac: Goal: Ability to achieve and maintain adequate cardiopulmonary perfusion will improve Outcome: Adequate for Discharge

## 2023-10-02 NOTE — Progress Notes (Signed)
 Received referral to assist with home O2. Discussed with pt preference for a DME agency. She reports she doesn't have a preference. Discussed [facility] or [employer]. She agreed to use [employer]. Contacted Jermaine with [employer] and he accepted the referral.

## 2023-10-02 NOTE — Discharge Summary (Signed)
 Physician Discharge Summary   Patient: Melissa [name] MRN: [id number] DOB: December 09, 1975  Admit date:     09/26/2023  Discharge date: 10/02/23  Discharge Physician: Marguerita Merles, DO   PCP: Shelby Dubin, FNP   Recommendations at discharge:  {Tip this will not be part of the note when signed- Example include specific recommendations for outpatient follow-up, pending tests to follow-up on. (Optional):26781}  ***  Discharge Diagnoses: Principal Problem:   Acute on chronic diastolic CHF (congestive heart failure) (HCC) Active Problems:   Atrial fibrillation, chronic (HCC)   Morbid obesity with BMI of 50.0-59.9, adult (HCC)   Anemia of chronic renal failure   Acute respiratory failure with hypoxia (HCC)   Essential hypertension   CKD (chronic kidney disease) stage 5, GFR less than 15 ml/min (HCC)  Resolved Problems:   * No resolved hospital problems. [facility] Course: The patient is a 48 year old female with CKD stage V s/p right arm fistula, not on hemodialysis yet,  HTN, hyperlipidemia, A-fib on Eliquis, obesity presented to ED with shortness of breath, worse with exertion or ambulation, was found to be hypoxic in ED.  Admitted for Acute on Chronic Diastolic CHF in the setting of CKD Stage 5 and Nephrology consulted and diuretics are being increased to IV Lasix 160 mg BID.  She desaturated on ambulatory home O2 screen again today but chest x-ray is improving.  Nephrology recommends continue IV diuresis for at least 1 more day.  Assessment and Plan:  Acute on Chronic Diastolic CHF (congestive heart failure) (HCC) in the setting of CKD stage V, volume overload -Presented with significant shortness of breath, DOE, pulmonary edema on chest x-ray, BNP 274 -Initially Started on high-dose IV Lasix 120 mg every 8 hours but did not have a great response so Nephrology increased to 160 mg twice daily and this is now being continued -Strict I's and O's and Daily Weights;  Intake/Output Summary  (Last 24 hours) at 10/01/2023 1522 Last data filed at 10/01/2023 1050 Gross per 24 hour  Intake 797.13 ml  Output 1700 ml  Net -902.87 ml  -2D echo 06/2023 had shown EF of 55 to 60%, G1 DD -Appreciate Nephrology Recc's and they are recommending c/w Diuresis today given that she still hypoxic on ambulation; Has F/U w/ Dr. Thedore Mins at [facility] on 10/17/23  Acute Respiratory Failure with Hypoxia -Initially was hypoxic on presentation and was weaned off of supplemental oxygen via nasal cannula and had O2 saturation of 99%.  Continues to intermittently desaturate and desaturated on home O2 screen today.  SpO2: 95 %; O2 Flow Rate (L/min): 2 L/min Nephrology recommends continuing diuresis and keeping her another day for IV diuresis.  Repeat ambulatory home O2 screen prior to D/C. Repeat CXR this AM showed the heart was in the upper limits of normal in size and there is no confluent airspace opacities, effusions or edema noted no acute bony abnormalities.  CKD (chronic kidney disease) Stage 5 / Metabolic Acidosis GFR less than 15 ml/min (HCC) with volume overload -BUN/Cr Trend: Recent Labs  Lab 09/27/23 1437 09/28/23 0516 09/29/23 0512 09/29/23 0513 09/30/23 0516 09/30/23 0517 10/01/23 0454  BUN 60* 63* 69* 68* 77* 78* 77*  CREATININE 6.18* 6.23* 6.43* 6.48* 6.60* 6.66* 6.93*  -C/w IV Lasix for Diuresis and Nephrology Consulted and after she desaturated; C/w diuresis w/ IV Lasix 160 mg BID and anticipating discharging on 80 mg of po Torsemide twice daily. -Has a slight metabolic acidosis with a CO2 of 19, anion  gap of 11, chloride level of 107 -C/w Calcitriol 0.5 mcg po Daily -Avoid Nephrotoxic Medications, Contrast Dyes, Hypotension and Dehydration to Ensure Adequate Renal Perfusion and will need to Renally Adjust Meds -Continue to Monitor and Trend Renal Function carefully and repeat CMP in the AM   Atrial Fibrillation, chronic (HCC): C/w Metoprolol Succinate 100 mg po daily and A/C with  Apixaban 5 mg po BID. Rate controlled. CTM and Trend on Telemetry    Essential HTN: Currently on high-dose IV Lasix for diuresis (receiving IV 160 mg Lasix BID again today). CTM BP per Protocol. Last BP reading was 169/72. Resume home Hydralazine 100 mg po q8h. CTM BP per Protocol. Last BP reading was 163/84  HLD: C/w Atorvastatin 80 mg po Daily  Depression and Anxiety: C/w Citalopram 30 mg po Daily  Normocytic Anemia/Anemia of Chronic Kidney Disease: B/L is around 7-7.9. Hgb/Hct went from 8.8/29.8 -> 8.9/29.8 -> 8.5/27.8 -> 7.6/24.1 -> 8.3/27.1 -> 8.3/25.8.  Anemia panel showed iron 40, TIBC 242, percent saturation show 17, ferritin 85, folate 11.4, B12 383. Getting IV Iron per Nephrology and continuing po Ferrous Sulfate tablet 325 mg po Daily.  Also received a dose of ESA.  CTM for S/Sx of Bleeding; No overt bleeding noted. Repeat CBC in the AM   GERD/GI Prophylaxis: C/w Pantoprazole 40 mg po Daily  Hypoalbuminemia: Patient's Albumin now 1.8 again. CTM and Trend and repeat CMP in the AM  Class III (Super Morbid) Obesity: Complicates overall prognosis and care. Estimated body mass index is 51.47 kg/m as calculated from the following:   Height as of this encounter: 5\' 5"  (1.651 m).   Weight as of this encounter: 140.3 kg. Weight Loss and Dietary Counseling given  Assessment and Plan: No notes have been filed under this hospital service. Service: Hospitalist     {Tip this will not be part of the note when signed Body mass index is 51.55 kg/m. , ,  (Optional):26781}  {(NOTE) Pain control PDMP Statment (Optional):26782} Consultants: *** Procedures performed: ***  Disposition: {Plan; Disposition:26390} Diet recommendation:  Discharge Diet Orders (From admission, onward)     Start     Ordered   10/02/23 0000  Diet - low sodium heart healthy        10/02/23 1440           {Diet_Plan:26776} DISCHARGE MEDICATION: Allergies as of 10/02/2023       Reactions   Ozempic (0.25 Or  0.5 Mg-dose) [semaglutide(0.25 Or 0.5mg -dos)] Nausea And Vomiting        Medication List     TAKE these medications    acetaminophen 500 MG tablet Commonly known as: TYLENOL Take 1,000 mg by mouth every 6 (six) hours as needed for moderate pain (pain score 4-6).   amLODipine 10 MG tablet Commonly known as: NORVASC Take 10 mg by mouth daily.   atorvastatin 80 MG tablet Commonly known as: LIPITOR Take 80 mg by mouth daily.   calcitRIOL 0.5 MCG capsule Commonly known as: ROCALTROL Take 0.5 mcg by mouth daily.   citalopram 20 MG tablet Commonly known as: CELEXA Take 30 mg by mouth daily.   Eliquis 5 MG Tabs tablet Generic drug: apixaban Take 1 tablet (5 mg total) by mouth 2 (two) times daily. Resume from 04/30/2023   ferrous sulfate 325 (65 FE) MG tablet Take 325 mg by mouth daily.   hydrALAZINE 100 MG tablet Commonly known as: APRESOLINE Take 1 tablet (100 mg total) by mouth every 8 (eight) hours.   HYDROcodone-acetaminophen  5-325 MG tablet Commonly known as: NORCO/VICODIN Take 1 tablet by mouth every 6 (six) hours as needed for moderate pain (pain score 4-6).   loratadine 10 MG tablet Commonly known as: CLARITIN Take 10 mg by mouth daily.   metoprolol succinate 100 MG 24 hr tablet Commonly known as: TOPROL-XL Take 1 tablet (100 mg total) by mouth daily. Take with or immediately following a meal.   ondansetron 4 MG tablet Commonly known as: ZOFRAN Take 1 tablet (4 mg total) by mouth every 6 (six) hours as needed for nausea.   potassium chloride SA 20 MEQ tablet Commonly known as: KLOR-CON M Take 1 tablet (20 mEq total) by mouth daily.   Protonix 40 MG tablet Generic drug: pantoprazole Take 40 mg by mouth daily.   sodium bicarbonate 650 MG tablet Take 650 mg by mouth 2 (two) times daily.   torsemide 20 MG tablet Commonly known as: DEMADEX Take 4 tablets (80 mg total) by mouth 2 (two) times daily. increase to 100 BID if patient has wt  gain/edema/orthopnea. What changed:  how much to take additional instructions   Tradjenta 5 MG Tabs tablet Generic drug: linagliptin Take 5 mg by mouth daily.               Durable Medical Equipment  (From admission, onward)           Start     Ordered   10/02/23 1436  For home use only DME oxygen  Once       Question Answer Comment  Length of Need 6 Months   Mode or (Route) Nasal cannula   Liters per Minute 2   Frequency Continuous (stationary and portable oxygen unit needed)   Oxygen delivery system Gas      10/02/23 1435            Discharge Exam: Filed Weights   09/30/23 0500 10/01/23 0506 10/02/23 0424  Weight: (!) 140.8 kg (!) 140.3 kg (!) 140.5 kg   ***  Condition at discharge: {DC Condition:26389}  The results of significant diagnostics from this hospitalization (including imaging, microbiology, ancillary and laboratory) are listed below for reference.   Imaging Studies: DG CHEST PORT 1 VIEW Result Date: 10/01/2023 CLINICAL DATA:  Shortness of breath EXAM: PORTABLE CHEST 1 VIEW COMPARISON:  09/30/2023 FINDINGS: Heart upper limits normal in size. No confluent airspace opacities, effusions or edema. No acute bony abnormality. IMPRESSION: No active disease. Electronically Signed   By: Charlett Nose M.D.   On: 10/01/2023 10:11   DG CHEST PORT 1 VIEW Result Date: 09/30/2023 CLINICAL DATA:  Shortness of breath. EXAM: PORTABLE CHEST 1 VIEW COMPARISON:  September 29, 2023. FINDINGS: Stable cardiomegaly with mild central pulmonary vascular congestion. Minimal left mid lung atelectasis is noted. Bony thorax is unremarkable. IMPRESSION: Stable cardiomegaly with mild central pulmonary vascular congestion. Minimal left midlung atelectasis. Electronically Signed   By: Lupita Raider M.D.   On: 09/30/2023 08:19   DG CHEST PORT 1 VIEW Result Date: 09/29/2023 CLINICAL DATA:  Shortness of breath. EXAM: PORTABLE CHEST 1 VIEW COMPARISON:  September 26, 2023. FINDINGS: Stable  cardiomegaly with central pulmonary vascular congestion and possible bilateral pulmonary edema. Minimal left midlung subsegmental atelectasis is noted. Bony thorax is unremarkable. IMPRESSION: Stable cardiomegaly with central pulmonary vascular congestion and possible bilateral pulmonary edema. Electronically Signed   By: Lupita Raider M.D.   On: 09/29/2023 11:12   ECHOCARDIOGRAM LIMITED Result Date: 09/27/2023    ECHOCARDIOGRAM LIMITED REPORT   Patient  Name:   Melissa [name] Date of Exam: 09/27/2023 Medical Rec #:  [id number]      Height:       65.0 in Accession #:    [id number]     Weight:       311.9 lb Date of Birth:  02-05-1976      BSA:          2.389 m Patient Age:    47 years       BP:           165/86 mmHg Patient Gender: F              HR:           84 bpm. Exam Location:  Inpatient Procedure: Limited Echo, Color Doppler and Cardiac Doppler (Both Spectral and            Color Flow Doppler were utilized during procedure). Indications:    acute diastolic congestive heart failure  History:        Patient has prior history of Echocardiogram examinations, most                 recent 07/23/2023. Chronic kidney disease; Risk                 Factors:Hypertension and Diabetes.  Sonographer:    Delcie Roch RDCS Referring Phys: Kenn File DOUTOVA  Sonographer Comments: Global longitudinal strain was attempted. IMPRESSIONS  1. Left ventricular ejection fraction, by estimation, is 70 to 75%. The left ventricle has hyperdynamic function. There is moderate concentric left ventricular hypertrophy. Left ventricular diastolic parameters are consistent with Grade II diastolic dysfunction (pseudonormalization). The average left ventricular global longitudinal strain is -18.6 %. The global longitudinal strain is normal.  2. Left atrial size was mildly dilated.  3. Trivial mitral valve regurgitation.  4. The aortic valve is tricuspid. There is mild calcification of the aortic valve.  5. There is mildly elevated  pulmonary artery systolic pressure. The estimated right ventricular systolic pressure is 38.8 mmHg.  6. The inferior vena cava is normal in size with greater than 50% respiratory variability, suggesting right atrial pressure of 3 mmHg. FINDINGS  Left Ventricle: Left ventricular ejection fraction, by estimation, is 70 to 75%. The left ventricle has hyperdynamic function. The average left ventricular global longitudinal strain is -18.6 %. Strain was performed and the global longitudinal strain is  normal. There is moderate concentric left ventricular hypertrophy. Left ventricular diastolic parameters are consistent with Grade II diastolic dysfunction (pseudonormalization). Right Ventricle: There is mildly elevated pulmonary artery systolic pressure. The tricuspid regurgitant velocity is 2.99 m/s, and with an assumed right atrial pressure of 3 mmHg, the estimated right ventricular systolic pressure is 38.8 mmHg. Left Atrium: Left atrial size was mildly dilated. Mitral Valve: Trivial mitral valve regurgitation. Tricuspid Valve: Tricuspid valve regurgitation is mild. Aortic Valve: The aortic valve is tricuspid. There is mild calcification of the aortic valve. Pulmonic Valve: Pulmonic valve regurgitation is trivial. Venous: The inferior vena cava is normal in size with greater than 50% respiratory variability, suggesting right atrial pressure of 3 mmHg. Additional Comments: Spectral Doppler performed. Color Doppler performed.  LEFT VENTRICLE PLAX 2D LVIDd:         4.50 cm   Diastology LVIDs:         2.50 cm   LV e' medial:    5.11 cm/s LV PW:         1.40 cm   LV E/e' medial:  16.6  LV IVS:        1.40 cm   LV e' lateral:   4.68 cm/s LVOT diam:     2.20 cm   LV E/e' lateral: 18.1 LV SV:         108 LV SV Index:   45        2D Longitudinal Strain LVOT Area:     3.80 cm  2D Strain GLS Avg:     -18.6 %  IVC IVC diam: 1.80 cm LEFT ATRIUM         Index LA diam:    4.40 cm 1.84 cm/m  AORTIC VALVE LVOT Vmax:   127.00 cm/s LVOT  Vmean:  89.100 cm/s LVOT VTI:    0.284 m MITRAL VALVE               TRICUSPID VALVE MV Area (PHT): 2.69 cm    TR Peak grad:   35.8 mmHg MV Decel Time: 282 msec    TR Vmax:        299.00 cm/s MV E velocity: 84.90 cm/s MV A velocity: 83.00 cm/s  SHUNTS MV E/A ratio:  1.02        Systemic VTI:  0.28 m                            Systemic Diam: 2.20 cm Arvilla Meres MD Electronically signed by Arvilla Meres MD Signature Date/Time: 09/27/2023/2:25:31 PM    Final    DG Chest Portable 1 View Result Date: 09/26/2023 CLINICAL DATA:  Peripheral edema.  Shortness of breath EXAM: PORTABLE CHEST 1 VIEW COMPARISON:  Chest radiograph dated 07/22/2023. FINDINGS: Cardiomegaly with vascular congestion and edema. No large pleural effusion. No pneumothorax. No acute osseous pathology IMPRESSION: Cardiomegaly with vascular congestion and edema. Electronically Signed   By: Elgie Collard M.D.   On: 09/26/2023 13:51    Microbiology: Results for orders placed or performed during the hospital encounter of 09/26/23  Resp panel by RT-PCR (RSV, Flu A&B, Covid) Anterior Nasal Swab     Status: None   Collection Time: 09/26/23 11:40 AM   Specimen: Anterior Nasal Swab  Result Value Ref Range Status   SARS Coronavirus 2 by RT PCR NEGATIVE NEGATIVE Final   Influenza A by PCR NEGATIVE NEGATIVE Final   Influenza B by PCR NEGATIVE NEGATIVE Final    Comment: (NOTE) The Xpert Xpress SARS-CoV-2/FLU/RSV plus assay is intended as an aid in the diagnosis of influenza from Nasopharyngeal swab specimens and should not be used as a sole basis for treatment. Nasal washings and aspirates are unacceptable for Xpert Xpress SARS-CoV-2/FLU/RSV testing.  Fact Sheet for Patients: BloggerCourse.com  Fact Sheet for Healthcare Providers: SeriousBroker.it  This test is not yet approved or cleared by the [place] FDA and has been authorized for detection and/or diagnosis of SARS-CoV-2  by FDA under an Emergency Use Authorization (EUA). This EUA will remain in effect (meaning this test can be used) for the duration of the COVID-19 declaration under Section 564(b)(1) of the Act, 21 U.S.C. section 360bbb-3(b)(1), unless the authorization is terminated or revoked.     Resp Syncytial Virus by PCR NEGATIVE NEGATIVE Final    Comment: (NOTE) Fact Sheet for Patients: BloggerCourse.com  Fact Sheet for Healthcare Providers: SeriousBroker.it  This test is not yet approved or cleared by the [place] FDA and has been authorized for detection and/or diagnosis of SARS-CoV-2 by FDA under an Emergency Use Authorization (EUA). This EUA  will remain in effect (meaning this test can be used) for the duration of the COVID-19 declaration under Section 564(b)(1) of the Act, 21 U.S.C. section 360bbb-3(b)(1), unless the authorization is terminated or revoked.  Performed at [facility], [address], [place], [place] [address]     Labs: CBC: Recent Labs  Lab 09/26/23 1111 09/27/23 1126 09/27/23 1437 09/28/23 0516 09/29/23 0512 09/30/23 0516 10/01/23 0454  WBC 10.3   < > 7.5 6.0 6.4 7.1 7.9  NEUTROABS 8.3*  --   --   --  3.9 4.5 5.2  HGB 8.5*   < > 8.9* 8.5* 7.6* 8.3* 8.3*  HCT 28.8*   < > 29.8* 27.8* 24.1* 27.1* 25.8*  MCV 86.0   < > 84.2 82.7 82.5 82.4 81.9  PLT 218   < > 245 237 249 238 263   < > = values in this interval not displayed.   Basic Metabolic Panel: Recent Labs  Lab 09/26/23 2034 09/27/23 1126 09/27/23 1437 09/28/23 0516 09/29/23 0512 09/29/23 0513 09/30/23 0516 09/30/23 0517 10/01/23 0454 10/02/23 0414  NA  --    < > 140   < > 137 138 137 137 137 136  K  --    < > 4.5   < > 4.1 4.2 4.2 4.2 4.1 4.0  CL  --    < > 111   < > 104 104 105 105 107 106  CO2  --    < > 18*   < > 20* 20* 17* 18* 19* 18*  GLUCOSE  --    < > 119*   < > 80 80 71 71 88 103*  BUN  --    < > 60*   < > 69* 68* 77*  78* 77* 82*  CREATININE  --    < > 6.18*   < > 6.43* 6.48* 6.60* 6.66* 6.93* 6.92*  CALCIUM  --    < > 8.5*   < > 8.3* 8.4* 8.3* 8.4* 8.7* 8.6*  MG 2.2  --  2.3  --  2.2  --  2.1  --  2.1  --   PHOS 6.8*  --  6.5*   < > 7.7* 7.6* 8.0* 7.9* 8.4* 8.3*   < > = values in this interval not displayed.   Liver Function Tests: Recent Labs  Lab 09/26/23 1111 09/27/23 1437 09/28/23 0516 09/29/23 0512 09/29/23 0513 09/30/23 0516 09/30/23 0517 10/01/23 0454 10/02/23 0414  AST 15 14*  --  12*  --  12*  --  15  --   ALT 7 7  --  7  --  8  --  9  --   ALKPHOS 46 46  --  38  --  37*  --  39  --   BILITOT 0.6 0.4  --  0.5  --  0.4  --  0.4  --   PROT 6.0* 6.1*  --  5.4*  --  5.4*  --  5.5*  --   ALBUMIN 1.8* 1.8*   < > 1.7* 1.7* 1.8* 1.8* 1.8* 1.9*   < > = values in this interval not displayed.   CBG: Recent Labs  Lab 09/27/23 1642 09/27/23 2052 09/28/23 0802 09/28/23 1235 09/28/23 1613  GLUCAP 137* 137* 90 109* 103*    Discharge time spent: {LESS THAN/GREATER ZHYQ:65784} 30 minutes.  Signed: Merlene Laughter, DO [facility] 10/02/2023

## 2023-10-02 NOTE — Progress Notes (Signed)
 Mobility Specialist Progress Note;   10/02/23 1210  Mobility  Activity Ambulated with assistance in hallway  Level of Assistance Modified independent, requires aide device or extra time  Assistive Device None  Distance Ambulated (ft) 220 ft  Activity Response Tolerated well  Mobility Referral Yes  Mobility visit 1 Mobility  Mobility Specialist Start Time (ACUTE ONLY) 1210  Mobility Specialist Stop Time (ACUTE ONLY) 1222  Mobility Specialist Time Calculation (min) (ACUTE ONLY) 12 min   Pt agreeable to mobility and walking O2 test. On 2LO2 upon arrival. Required no physical assistance during ambulation. Pt needed 1LO2 to stay [facility] while ambulating. Pt c/o some fatigue once returned back to bed. Pt left in bed with all needs met. On 2LO2.   Caesar Bookman Mobility Specialist Please contact via SecureChat or [employer] [phone]

## 2023-10-02 NOTE — Progress Notes (Signed)
 Nurse requested Mobility Specialist to perform oxygen saturation test with pt which includes removing pt from oxygen both at rest and while ambulating.  Below are the results from that testing.     Patient Saturations on Room Air at Rest = spO2 98%  Patient Saturations on Room Air while Ambulating = sp02 87% .  Patient Saturations on 1 Liters of oxygen while Ambulating = sp02 90%> .  At end of testing pt left in room on 2  Liters of oxygen.  Reported results to nurse.    Caesar Bookman Mobility Specialist Please contact via SecureChat or [employer] [phone]

## 2023-10-02 NOTE — Progress Notes (Signed)
 Reviewed AVS, patient expressed understanding of medications, MD follow up reviewed.   Removed IV, Site clean, dry and intact.  Patient states all belongings brought to the hospital at time of admission are accounted for and packed to take home.  Patient informed on where to picked up medications. Pt transported to Discharge lounge to wait for transportation home.

## 2023-10-10 ENCOUNTER — Encounter: Payer: Self-pay | Admitting: Nephrology

## 2023-10-17 ENCOUNTER — Ambulatory Visit (HOSPITAL_COMMUNITY)

## 2023-10-21 ENCOUNTER — Other Ambulatory Visit: Payer: Self-pay | Admitting: Nephrology

## 2023-10-21 ENCOUNTER — Telehealth: Payer: Self-pay

## 2023-10-21 DIAGNOSIS — D631 Anemia in chronic kidney disease: Secondary | ICD-10-CM | POA: Insufficient documentation

## 2023-10-21 DIAGNOSIS — N189 Chronic kidney disease, unspecified: Secondary | ICD-10-CM | POA: Insufficient documentation

## 2023-10-21 NOTE — Telephone Encounter (Signed)
 Auth Submission: NO AUTH NEEDED Site of care: Site of care: [facility] Payer: [employer] Medication & CPT/J Code(s) submitted: Venofer  (Iron  Sucrose) J1756 Route of submission (phone, fax, portal): phone Phone # Fax # Auth type: Buy/Bill PB Units/visits requested: 200mg , 4 doses Reference number:  Approval from: 10/21/23 to 06/27/24

## 2023-11-11 ENCOUNTER — Other Ambulatory Visit: Payer: Self-pay | Admitting: *Deleted

## 2023-11-11 DIAGNOSIS — N186 End stage renal disease: Secondary | ICD-10-CM

## 2023-11-15 ENCOUNTER — Encounter: Admitting: Vascular Surgery

## 2023-11-15 ENCOUNTER — Ambulatory Visit

## 2023-11-15 DIAGNOSIS — N186 End stage renal disease: Secondary | ICD-10-CM

## 2023-11-15 NOTE — Therapy (Signed)
 OUTPATIENT PHYSICAL THERAPY LOWER EXTREMITY EVALUATION   Patient Name: Melissa [name] MRN: [id number] DOB:Dec 12, 1975, 48 y.o., female Today's Date: 11/16/2023  END OF SESSION:  PT End of Session - 11/16/23 0935     Visit Number 1    Number of Visits 6    Date for PT Re-Evaluation 12/28/23    Authorization Type [employer]; Medicaid secondary    Authorization Time Period please check auth    PT Start Time 0935    PT Stop Time 1014    PT Time Calculation (min) 39 min    Activity Tolerance Patient tolerated treatment well    Behavior During Therapy WFL for tasks assessed/performed             Past Medical History:  Diagnosis Date   Anemia    Anxiety    Chronic kidney disease    Complication of anesthesia    Diabetes mellitus without complication (HCC)    Dysrhythmia 01/2019   PAF   GERD (gastroesophageal reflux disease)    History of hiatal hernia    Hypertension    PONV (postoperative nausea and vomiting)    Past Surgical History:  Procedure Laterality Date   AV FISTULA PLACEMENT Right 09/21/2023   Procedure: Right Brachiocephalic ARTERIOVENOUS (AV) FISTULA CREATION;  Surgeon: Carlene Che, MD;  Location: [facility] OR;  Service: Vascular;  Laterality: Right;   BIOPSY  02/02/2022   Procedure: BIOPSY;  Surgeon: Urban Garden, MD;  Location[facility];  Service: Gastroenterology;;   ESOPHAGOGASTRODUODENOSCOPY (EGD) WITH PROPOFOL  N/A 02/02/2022   Procedure: ESOPHAGOGASTRODUODENOSCOPY (EGD) WITH PROPOFOL ;  Surgeon: Urban Garden, MD;  Location: [facility];  Service: Gastroenterology;  Laterality: N/A;  240   NO PAST SURGERIES     Patient Active Problem List   Diagnosis Date Noted   Anemia in CKD (chronic kidney disease) 10/21/2023   Acute on chronic diastolic CHF (congestive heart failure) (HCC) 09/26/2023   CKD (chronic kidney disease) stage 5, GFR less than 15 ml/min (HCC) 09/26/2023   Pleural effusion due to CHF (congestive heart failure) (HCC)  07/23/2023   Elevated brain natriuretic peptide (BNP) level 07/23/2023   Acute respiratory failure with hypoxia (HCC) 07/23/2023   Essential hypertension 07/23/2023   Anemia of chronic renal failure 07/15/2023   Hypertensive emergency 04/17/2023   Acute kidney injury superimposed on stage 5 chronic kidney disease, not on chronic dialysis (HCC) 04/17/2023   Intractable vomiting 04/17/2023   DM (diabetes mellitus) (HCC) 04/17/2023   Morbid obesity with BMI of 50.0-59.9, adult (HCC) 04/17/2023   Atrial fibrillation, chronic (HCC) 04/17/2023   Nausea and vomiting 01/04/2022   Pain of upper abdomen 01/04/2022   Diabetes mellitus affecting pregnancy 01/20/2016   Advanced maternal age, primigravida, antepartum 01/20/2016   Advanced maternal age, 1st pregnancy     PCP: Bucio, Elsa C, FNP  REFERRING PROVIDER: Bucio, Elsa C, FNP  REFERRING DIAG: M62.81 (ICD-10-CM) - Muscle weakness (generalized)  THERAPY DIAG:  Muscle weakness (generalized)  Other abnormalities of gait and mobility  Difficulty in walking, not elsewhere classified  Bilateral leg pain  Rationale for Evaluation and Treatment: Rehabilitation  ONSET DATE: 2 years  SUBJECTIVE:   SUBJECTIVE STATEMENT: Pain in legs; constantly cramping; she states she is stage 5 CKD.  She wants to be able to walk and lose weight and get on a donor list.  She thinks it is possible that it's neuropathy. 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 Yes: NPRS scale: 0/10 sitting, 3/10 standing and walking Pain location: both calves and thighs Pain description: calves tightness up Aggravating factors: standing or walking more than 5 minutes Relieving factors: sitting  PRECAUTIONS: None  RED FLAGS: None   WEIGHT BEARING RESTRICTIONS: No  FALLS:  Has patient fallen in last 6 months? 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  DIAGNOSTIC FINDINGS:   PATIENT SURVEYS:  LEFS 31/80  38.8% Extreme difficulty/unable (0), Quite a bit of difficulty (1), Moderate difficulty (2), Little difficulty (3), No difficulty (4) Survey date:    Any of your usual work, housework or school activities   2. Usual hobbies, recreational or sporting activities   3. Getting into/out of the bath   4. Walking between rooms   5. Putting on socks/shoes   6. Squatting    7. Lifting an object, like a bag of groceries from the floor   8. Performing light activities around your home   9. Performing heavy activities around your home   10. Getting into/out of a car   11. Walking 2 blocks   12. Walking 1 mile   13. Going up/down 10 stairs (1 flight)   14. Standing for 1 hour   15.  sitting for 1 hour   16. Running on even ground   17. Running on uneven ground   18. Making sharp turns while running fast   19. Hopping    20. Rolling over in bed   Score total:  31/80     COGNITION: Overall cognitive status: Within functional limits for tasks assessed     SENSATION: WFL  EDEMA:  No significant swelling noted but patient states she swells as the day progresses  POSTURE: rounded shoulders and forward head  PALPATION: No significant soreness on palpation  LOWER EXTREMITY ROM:  Lumbar flexion AROM to 3" above ankle Extension AROM 20% available and painful  Active ROM Right eval Left eval  Hip flexion    Hip extension    Hip abduction    Hip adduction    Hip internal rotation    Hip external rotation    Knee flexion    Knee extension    Ankle dorsiflexion    Ankle plantarflexion    Ankle inversion    Ankle eversion     (Blank rows = not tested)  LOWER EXTREMITY MMT:  MMT Right eval Left eval  Hip flexion 3+ 4  Hip extension 3- 4   Hip abduction    Hip adduction    Hip internal rotation    Hip external rotation    Knee flexion 4 4+  Knee extension 4+ 5  Ankle dorsiflexion 5 5  Ankle plantarflexion    Ankle inversion    Ankle eversion     (Blank rows = not tested)   FUNCTIONAL TESTS:  5 times sit to stand: 16.11 hands on thighs  GAIT: Distance walked: 50 ft in clinic Assistive device utilized: None Level of assistance: Modified independence Comments: slow gait speed; wide BOS  TREATMENT DATE: 11/15/23 physical therapy evaluation and HEP instruction    PATIENT EDUCATION:  Education details: Patient educated on exam findings, POC, scope of PT, HEP, and benefits of aquatic therapy. Person educated: Patient Education method: Explanation, Demonstration, and Handouts Education comprehension: verbalized understanding, returned demonstration, verbal cues required, and tactile cues required HOME EXERCISE PROGRAM: Access Code: [email or web address] URL: [email or web address] Date: 11/16/2023 Prepared by: [facility] - Rehab  Exercises - Supine Bridge  - 2 x daily - 7 x weekly - 1 sets - 10 reps - Seated Hip Adduction Isometrics with Ball  - 2 x daily - 7 x weekly - 1 sets - 10 reps - 5 sec hold - Seated Hip Abduction with Resistance  - 2 x daily - 7 x weekly - 2 sets - 10 reps - Standing Lumbar Extension  - 5 x daily - 7 x weekly - 1 sets - 10 reps - Seated March  - 2 x daily - 7 x weekly - 1 sets - 10 reps - Seated Long Arc Quad  - 2 x daily - 7 x weekly - 2 sets - 10 reps  ASSESSMENT:  CLINICAL IMPRESSION: Patient is a 48 y.o. female who was seen today for physical therapy evaluation and treatment for M62.81 (ICD-10-CM) - Muscle weakness (generalized).  Patient demonstrates muscle weakness, reduced ROM, and fascial restrictions which are likely contributing to symptoms of pain and are  negatively impacting patient ability to perform ADLs and functional mobility tasks. Patient will benefit from skilled physical therapy services to address these deficits to reduce pain and improve level of function with ADLs and functional mobility tasks.   OBJECTIVE IMPAIRMENTS: Abnormal gait, decreased activity tolerance, decreased knowledge of condition, difficulty walking, decreased strength, increased fascial restrictions, impaired perceived functional ability, obesity, and pain.   ACTIVITY LIMITATIONS: carrying, standing, locomotion level, and caring for others  PARTICIPATION LIMITATIONS: meal prep, cleaning, laundry, shopping, community activity, and yard work  PERSONAL FACTORS: 3+ comorbidities: DM, CKD stage 5, afib, HTN, CHF are also affecting patient's functional outcome.   REHAB POTENTIAL: Good  CLINICAL DECISION MAKING: Evolving/moderate complexity  EVALUATION COMPLEXITY: Moderate   GOALS: Goals reviewed with patient? No  SHORT TERM GOALS: Target date: 11/30/2023 patient will be independent with initial HEP   Baseline: Goal status: INITIAL  2.  Patient will report 30% improvement overall  Baseline:  Goal status: INITIAL   LONG TERM GOALS: Target date: 12/28/2023  Patient will be independent in self management strategies to improve quality of life and functional outcomes.  Baseline:  Goal status: INITIAL  2.  Patient will report 50% improvement overall  Baseline:  Goal status: INITIAL  3.  Patient will improve LEFS score by 9 points to demonstrate improved perceived function  Baseline: 31/80 Goal status: INITIAL  4.  Patient will improve 5 times sit to stand score to 12 sec or less to demonstrate improved functional mobility and increased leg strength.    Baseline: 16.11 sec Goal status: INITIAL  5.   Patient will increase right leg MMT's to 4+ to 5/5 to allow navigation of steps without gait deviation or loss of balance Baseline: see above Goal  status: INITIAL  PLAN:  PT FREQUENCY: 1x/week  PT DURATION: 6 weeks  PLANNED INTERVENTIONS: 97164- PT Re-evaluation, 97110-Therapeutic exercises, 97530- Therapeutic activity, 97112- Neuromuscular re-education, 97535- Self Care, 86578- Manual therapy, [phone]- Gait training, [phone]- Orthotic Fit/training, [phone]- Canalith repositioning, [id number]- Aquatic Therapy, [phone]- Splinting, Patient/Family education, Balance training, Stair training, Taping, Dry Needling, Joint mobilization, Joint  manipulation, Spinal manipulation, Spinal mobilization, Scar mobilization, and DME instructions.   PLAN FOR NEXT SESSION: Review HEP and goals; 2 MWT;  hip and leg strengthening; functional strengthening; question possible back involvement versus circulation issue versus neuropathy.  Patient may be a good candidate for aquatic therapy if safe for fistula.     10:11 AM, 11/16/23 Dravon Nott Small Sheritta Deeg MPT [facility] [place] [phone] Ph:[phone]   Managed Medicaid Authorization Request  Visit Dx Codes: M62.81  Functional Tool Score: LEFS 31/80 35.8%  For all possible CPT codes, reference the Planned Interventions line above.     Check all conditions that are expected to impact treatment: {Conditions expected to impact treatment:Morbid obesity and Diabetes mellitus   If treatment provided at initial evaluation, no treatment charged due to lack of authorization.

## 2023-11-16 ENCOUNTER — Ambulatory Visit (HOSPITAL_COMMUNITY): Attending: Family Medicine

## 2023-11-16 ENCOUNTER — Other Ambulatory Visit: Payer: Self-pay

## 2023-11-16 DIAGNOSIS — M79604 Pain in right leg: Secondary | ICD-10-CM | POA: Insufficient documentation

## 2023-11-16 DIAGNOSIS — M79605 Pain in left leg: Secondary | ICD-10-CM | POA: Insufficient documentation

## 2023-11-16 DIAGNOSIS — R262 Difficulty in walking, not elsewhere classified: Secondary | ICD-10-CM | POA: Insufficient documentation

## 2023-11-16 DIAGNOSIS — M6281 Muscle weakness (generalized): Secondary | ICD-10-CM | POA: Diagnosis present

## 2023-11-16 DIAGNOSIS — R2689 Other abnormalities of gait and mobility: Secondary | ICD-10-CM | POA: Diagnosis present

## 2023-11-17 ENCOUNTER — Encounter: Payer: Self-pay | Admitting: Vascular Surgery

## 2023-11-17 ENCOUNTER — Ambulatory Visit (INDEPENDENT_AMBULATORY_CARE_PROVIDER_SITE_OTHER): Admitting: Vascular Surgery

## 2023-11-17 VITALS — BP 150/92 | HR 76 | Ht 65.0 in | Wt 310.0 lb

## 2023-11-17 DIAGNOSIS — N184 Chronic kidney disease, stage 4 (severe): Secondary | ICD-10-CM

## 2023-11-17 NOTE — Progress Notes (Signed)
 [facility] [place]  ASSESSMENT / PLAN: Melissa [name] is a 48 y.o. CKD 4 status post right brachiocephalic AV fistula creation.  The fistula has matured nicely since creation.  Unfortunately the fistula is too deep to cannulate.  She will need superficialization.  Will plan to do this as the OR schedule allows.  CHIEF COMPLAINT: Worsening renal function  HISTORY OF PRESENT ILLNESS: Melissa [name] is a 48 y.o. female referred to clinic for discussion of permanent dialysis access.  The patient has had deteriorating renal function for some time.  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  She is right-handed.  She has never had dialysis access before.  She has never had a pacemaker.  11/17/2023.  Patient returns to clinic for follow-up after a brachiocephalic fistula creation on the right side.  She is doing well.  She has no hand related complaints.  She has a palpable radial pulse.  She is healed nicely.  We reviewed her duplex which shows the fistula is too deep to cannulate.  I reviewed the rationale for fistula superficialization.  She is understanding and willing to proceed.  Past Medical History:  Diagnosis Date   Anemia    Anxiety    Chronic kidney disease    Complication of anesthesia    Diabetes mellitus without complication (HCC)    Dysrhythmia 01/2019   PAF   GERD (gastroesophageal reflux disease)    History of hiatal hernia    Hypertension    PONV (postoperative nausea and vomiting)     Past Surgical History:  Procedure Laterality Date   AV FISTULA PLACEMENT Right 09/21/2023   Procedure: Right Brachiocephalic ARTERIOVENOUS (AV) FISTULA CREATION;  Surgeon: Carlene Che, MD;  Location: [facility] OR;  Service: Vascular;  Laterality: Right;   BIOPSY  02/02/2022   Procedure: BIOPSY;  Surgeon: Urban Garden, MD;  Location: [facility];  Service: Gastroenterology;;   ESOPHAGOGASTRODUODENOSCOPY (EGD) WITH PROPOFOL  N/A 02/02/2022   Procedure:  ESOPHAGOGASTRODUODENOSCOPY (EGD) WITH PROPOFOL ;  Surgeon: Urban Garden, MD;  Location: [facility];  Service: Gastroenterology;  Laterality: N/A;  240   NO PAST SURGERIES      History reviewed. No pertinent family history.  Social History   Socioeconomic History   Marital status: Married    Spouse name: Not on file   Number of children: Not on file   Years of education: Not on file   Highest education level: Not on file  Occupational History   Not on file  Tobacco Use   Smoking status: Former    Types: Cigarettes    Passive exposure: Past   Smokeless tobacco: Never  Vaping Use   Vaping status: Never Used  Substance and Sexual Activity   Alcohol use: Not Currently    Comment: occasional drink   Drug use: No   Sexual activity: Never  Other Topics Concern   Not on file  Social History Narrative   Not on file   Social Drivers of Health   Financial Resource Strain: Low Risk  (11/10/2021)   [facility]   Overall Financial Resource Strain (CARDIA)    Difficulty of Paying Living Expenses: Not very hard  Food Insecurity: No Food Insecurity (09/26/2023)   Hunger Vital Sign    Worried About Running Out of Food in the Last Year: Never true    Ran Out of Food in the Last Year: Never true  Transportation Needs: No Transportation Needs (  09/26/2023)   PRAPARE - Administrator, Civil Service (Medical): No    Lack of Transportation (Non-Medical): No  Physical Activity: Not on file  Stress: Not on file  Social Connections: Not on file  Intimate Partner Violence: Not At Risk (09/26/2023)   Humiliation, Afraid, Rape, and Kick questionnaire    Fear of Current or Ex-Partner: No    Emotionally Abused: No    Physically Abused: No    Sexually Abused: No    Allergies  Allergen Reactions   Ozempic (0.25 Or 0.5 Mg-Dose) [Semaglutide(0.25 Or 0.5mg -Dos)] Nausea And Vomiting    Current Outpatient Medications  Medication Sig  Dispense Refill   acetaminophen  (TYLENOL ) 500 MG tablet Take 1,000 mg by mouth every 6 (six) hours as needed for moderate pain (pain score 4-6).     amLODipine  (NORVASC ) 10 MG tablet Take 10 mg by mouth daily.     atorvastatin  (LIPITOR) 80 MG tablet Take 80 mg by mouth daily.     calcitRIOL  (ROCALTROL ) 0.5 MCG capsule Take 0.5 mcg by mouth daily.     citalopram  (CELEXA ) 20 MG tablet Take 30 mg by mouth daily.     ELIQUIS  5 MG TABS tablet Take 1 tablet (5 mg total) by mouth 2 (two) times daily. Resume from 04/30/2023     ferrous sulfate  325 (65 FE) MG tablet Take 325 mg by mouth daily.     hydrALAZINE  (APRESOLINE ) 100 MG tablet Take 1 tablet (100 mg total) by mouth every 8 (eight) hours. 90 tablet 0   HYDROcodone -acetaminophen  (NORCO/VICODIN) 5-325 MG tablet Take 1 tablet by mouth every 6 (six) hours as needed for moderate pain (pain score 4-6). 10 tablet 0   loratadine  (CLARITIN ) 10 MG tablet Take 10 mg by mouth daily.     metoprolol  succinate (TOPROL -XL) 100 MG 24 hr tablet Take 1 tablet (100 mg total) by mouth daily. Take with or immediately following a meal. 30 tablet 0   ondansetron  (ZOFRAN ) 4 MG tablet Take 1 tablet (4 mg total) by mouth every 6 (six) hours as needed for nausea. 20 tablet 0   pantoprazole  (PROTONIX ) 40 MG tablet Take 40 mg by mouth daily.     potassium chloride  SA (KLOR-CON  M) 20 MEQ tablet Take 1 tablet (20 mEq total) by mouth daily. 60 tablet 1   sodium bicarbonate  650 MG tablet Take 650 mg by mouth 2 (two) times daily.     TRADJENTA 5 MG TABS tablet Take 5 mg by mouth daily.     torsemide  (DEMADEX ) 20 MG tablet Take 4 tablets (80 mg total) by mouth 2 (two) times daily. increase to 100 BID if patient has wt gain/edema/orthopnea. 120 tablet 0   No current facility-administered medications for this visit.    PHYSICAL EXAM Vitals:   11/17/23 0932  BP: (!) 150/92  Pulse: 76  SpO2: 95%  Weight: (!) 310 lb (140.6 kg)  Height: 5\' 5"  (1.651 m)    Middle-age woman in no  distress Regular rate and rhythm Unlabored breathing 2+ radial pulses bilaterally Right brachiocephalic incision is healed Strong thrill in the fistula palpable near the incision.  The fistula is now palpable above this.  PERTINENT LABORATORY AND RADIOLOGIC DATA  Most recent CBC    Latest Ref Rng & Units 10/01/2023    4:54 AM 09/30/2023    5:16 AM 09/29/2023    5:12 AM  CBC  WBC 4.0 - 10.5 K/uL 7.9  7.1  6.4   Hemoglobin 12.0 - 15.0 g/dL  8.3  8.3  7.6   Hematocrit 36.0 - 46.0 % 25.8  27.1  24.1   Platelets 150 - 400 K/uL 263  238  249      Most recent CMP    Latest Ref Rng & Units 10/02/2023    4:14 AM 10/01/2023    4:54 AM 09/30/2023    5:17 AM  CMP  Glucose 70 - 99 mg/dL 865  88  71   BUN 6 - 20 mg/dL 82  77  78   Creatinine 0.44 - 1.00 mg/dL 7.84  6.96  2.95   Sodium 135 - 145 mmol/L 136  137  137   Potassium 3.5 - 5.1 mmol/L 4.0  4.1  4.2   Chloride 98 - 111 mmol/L 106  107  105   CO2 22 - 32 mmol/L 18  19  18    Calcium  8.9 - 10.3 mg/dL 8.6  8.7  8.4   Total Protein 6.5 - 8.1 g/dL  5.5    Total Bilirubin 0.0 - 1.2 mg/dL  0.4    Alkaline Phos 38 - 126 U/L  39    AST 15 - 41 U/L  15    ALT 0 - 44 U/L  9      Renal function CrCl cannot be calculated (Patient's most recent lab result is older than the maximum 21 days allowed.).  Hgb A1c MFr Bld (%)  Date Value  04/17/2023 8.5 (H)   Fistula duplex shows a strong fistula with 2.1 L of flow per minute.  There is some branches off of the fistula visualized.  The fistula is far too deep to cannulate.   Heber Little. Edgardo Goodwill, MD FACS [facility] Phone Number: [phone] 11/17/2023 11:08 AM   Total time spent on preparing this encounter including chart review, data review, collecting history, examining the patient, coordinating care for this established patient, 20 minutes  Portions of this report may have been transcribed using voice recognition software.  Every effort has been made to  ensure accuracy; however, inadvertent computerized transcription errors may still be present.

## 2023-11-18 ENCOUNTER — Telehealth: Payer: Self-pay

## 2023-11-18 NOTE — Telephone Encounter (Signed)
 Attempted to call for surgery scheduling. LVM

## 2023-11-22 ENCOUNTER — Encounter (HOSPITAL_COMMUNITY)

## 2023-11-22 ENCOUNTER — Telehealth (HOSPITAL_COMMUNITY): Payer: Self-pay

## 2023-11-22 NOTE — Telephone Encounter (Signed)
 No show, called and left message concerning missed apt today. Included next apt date and time in message with additional contact number included if needs to cancel/reschedule future apts.  Minor Amble, LPTA/CLT; Johnye Napoleon [phone]

## 2023-11-23 ENCOUNTER — Ambulatory Visit

## 2023-11-23 MED ORDER — DIPHENHYDRAMINE HCL 25 MG PO CAPS: 25.0000 mg | ORAL_CAPSULE | Freq: Once | ORAL | Status: DC

## 2023-11-23 MED ORDER — ACETAMINOPHEN 325 MG PO TABS: 650.0000 mg | ORAL_TABLET | Freq: Once | ORAL | Status: DC

## 2023-11-24 ENCOUNTER — Other Ambulatory Visit: Payer: Self-pay

## 2023-11-24 DIAGNOSIS — N186 End stage renal disease: Secondary | ICD-10-CM

## 2023-11-29 ENCOUNTER — Encounter (HOSPITAL_COMMUNITY)

## 2023-11-29 ENCOUNTER — Telehealth (HOSPITAL_COMMUNITY): Payer: Self-pay

## 2023-11-29 NOTE — Telephone Encounter (Signed)
 No show #2 called and spoke to patient who stated she has work conflict and couldn't find APPT number to call and cancel. Pt educated on no show policy details and reviewed next apt date and time with contact number given if needs to change apts in the future.  Minor Amble, LPTA/CLT; Johnye Napoleon [phone]

## 2023-11-29 NOTE — Telephone Encounter (Deleted)
 No show #2 called and spoke to patient who stated she has work conflict and couldn't find APPT number to call and cancel. Pt educated on no show policy details and reviewed next apt date and time.  Minor Amble, LPTA/CLT; Johnye Napoleon [phone]

## 2023-12-03 ENCOUNTER — Emergency Department (HOSPITAL_COMMUNITY)

## 2023-12-03 ENCOUNTER — Encounter (HOSPITAL_COMMUNITY): Payer: Self-pay | Admitting: *Deleted

## 2023-12-03 ENCOUNTER — Other Ambulatory Visit: Payer: Self-pay

## 2023-12-03 ENCOUNTER — Inpatient Hospital Stay (HOSPITAL_COMMUNITY)
Admission: EM | Admit: 2023-12-03 | Discharge: 2023-12-05 | DRG: 291 | Disposition: A | Discharge status: Home / Self Care | Attending: Family Medicine | Admitting: Family Medicine

## 2023-12-03 DIAGNOSIS — F419 Anxiety disorder, unspecified: Secondary | ICD-10-CM | POA: Diagnosis present

## 2023-12-03 DIAGNOSIS — Z7901 Long term (current) use of anticoagulants: Secondary | ICD-10-CM

## 2023-12-03 DIAGNOSIS — Z87891 Personal history of nicotine dependence: Secondary | ICD-10-CM | POA: Diagnosis not present

## 2023-12-03 DIAGNOSIS — N185 Chronic kidney disease, stage 5: Secondary | ICD-10-CM | POA: Diagnosis present

## 2023-12-03 DIAGNOSIS — Z6841 Body Mass Index (BMI) 40.0 and over, adult: Secondary | ICD-10-CM

## 2023-12-03 DIAGNOSIS — I482 Chronic atrial fibrillation, unspecified: Secondary | ICD-10-CM | POA: Diagnosis present

## 2023-12-03 DIAGNOSIS — I480 Paroxysmal atrial fibrillation: Secondary | ICD-10-CM | POA: Diagnosis present

## 2023-12-03 DIAGNOSIS — E872 Acidosis, unspecified: Secondary | ICD-10-CM | POA: Diagnosis present

## 2023-12-03 DIAGNOSIS — Z79899 Other long term (current) drug therapy: Secondary | ICD-10-CM

## 2023-12-03 DIAGNOSIS — D631 Anemia in chronic kidney disease: Secondary | ICD-10-CM | POA: Diagnosis present

## 2023-12-03 DIAGNOSIS — J9621 Acute and chronic respiratory failure with hypoxia: Secondary | ICD-10-CM | POA: Diagnosis present

## 2023-12-03 DIAGNOSIS — E785 Hyperlipidemia, unspecified: Secondary | ICD-10-CM | POA: Diagnosis present

## 2023-12-03 DIAGNOSIS — I509 Heart failure, unspecified: Principal | ICD-10-CM

## 2023-12-03 DIAGNOSIS — E1122 Type 2 diabetes mellitus with diabetic chronic kidney disease: Secondary | ICD-10-CM | POA: Diagnosis present

## 2023-12-03 DIAGNOSIS — I5033 Acute on chronic diastolic (congestive) heart failure: Secondary | ICD-10-CM | POA: Diagnosis present

## 2023-12-03 DIAGNOSIS — Z9981 Dependence on supplemental oxygen: Secondary | ICD-10-CM | POA: Diagnosis not present

## 2023-12-03 DIAGNOSIS — Z7984 Long term (current) use of oral hypoglycemic drugs: Secondary | ICD-10-CM | POA: Diagnosis not present

## 2023-12-03 DIAGNOSIS — K219 Gastro-esophageal reflux disease without esophagitis: Secondary | ICD-10-CM | POA: Diagnosis present

## 2023-12-03 DIAGNOSIS — F32A Depression, unspecified: Secondary | ICD-10-CM | POA: Diagnosis present

## 2023-12-03 DIAGNOSIS — Z888 Allergy status to other drugs, medicaments and biological substances status: Secondary | ICD-10-CM

## 2023-12-03 DIAGNOSIS — I5032 Chronic diastolic (congestive) heart failure: Secondary | ICD-10-CM | POA: Diagnosis present

## 2023-12-03 DIAGNOSIS — N2581 Secondary hyperparathyroidism of renal origin: Secondary | ICD-10-CM | POA: Diagnosis present

## 2023-12-03 DIAGNOSIS — I132 Hypertensive heart and chronic kidney disease with heart failure and with stage 5 chronic kidney disease, or end stage renal disease: Secondary | ICD-10-CM | POA: Diagnosis present

## 2023-12-03 DIAGNOSIS — I10 Essential (primary) hypertension: Secondary | ICD-10-CM | POA: Diagnosis present

## 2023-12-03 LAB — CBC WITH DIFFERENTIAL/PLATELET
Abs Immature Granulocytes: 0.03 10*3/uL (ref 0.00–0.07)
Basophils Absolute: 0 10*3/uL (ref 0.0–0.1)
Basophils Relative: 0 %
Eosinophils Absolute: 0.3 10*3/uL (ref 0.0–0.5)
Eosinophils Relative: 4 %
HCT: 23.7 % — ABNORMAL LOW (ref 36.0–46.0)
Hemoglobin: 7.2 g/dL — ABNORMAL LOW (ref 12.0–15.0)
Immature Granulocytes: 0 %
Lymphocytes Relative: 8 %
Lymphs Abs: 0.6 10*3/uL — ABNORMAL LOW (ref 0.7–4.0)
MCH: 26 pg (ref 26.0–34.0)
MCHC: 30.4 g/dL (ref 30.0–36.0)
MCV: 85.6 fL (ref 80.0–100.0)
Monocytes Absolute: 0.5 10*3/uL (ref 0.1–1.0)
Monocytes Relative: 7 %
Neutro Abs: 5.9 10*3/uL (ref 1.7–7.7)
Neutrophils Relative %: 81 %
Platelets: 255 10*3/uL (ref 150–400)
RBC: 2.77 MIL/uL — ABNORMAL LOW (ref 3.87–5.11)
RDW: 16.9 % — ABNORMAL HIGH (ref 11.5–15.5)
WBC: 7.3 10*3/uL (ref 4.0–10.5)
nRBC: 0 % (ref 0.0–0.2)

## 2023-12-03 LAB — BASIC METABOLIC PANEL WITH GFR
Anion gap: 11 (ref 5–15)
BUN: 61 mg/dL — ABNORMAL HIGH (ref 6–20)
CO2: 19 mmol/L — ABNORMAL LOW (ref 22–32)
Calcium: 8.7 mg/dL — ABNORMAL LOW (ref 8.9–10.3)
Chloride: 109 mmol/L (ref 98–111)
Creatinine, Ser: 6.96 mg/dL — ABNORMAL HIGH (ref 0.44–1.00)
GFR, Estimated: 7 mL/min — ABNORMAL LOW (ref >60)
Glucose, Bld: 186 mg/dL — ABNORMAL HIGH (ref 70–99)
Potassium: 4.7 mmol/L (ref 3.5–5.1)
Sodium: 139 mmol/L (ref 135–145)

## 2023-12-03 LAB — TROPONIN I (HIGH SENSITIVITY): Troponin I (High Sensitivity): 20 ng/L — ABNORMAL HIGH (ref <18)

## 2023-12-03 LAB — BRAIN NATRIURETIC PEPTIDE: B Natriuretic Peptide: 572 pg/mL — ABNORMAL HIGH (ref 0.0–100.0)

## 2023-12-03 LAB — PHOSPHORUS: Phosphorus: 5.7 mg/dL — ABNORMAL HIGH (ref 2.5–4.6)

## 2023-12-03 LAB — MAGNESIUM: Magnesium: 2.2 mg/dL (ref 1.7–2.4)

## 2023-12-03 MED ORDER — FERROUS SULFATE 325 (65 FE) MG PO TABS
325.0000 mg | ORAL_TABLET | Freq: Every day | ORAL | Status: DC
  Administered 2023-12-04 – 2023-12-05 (×2): 325 mg via ORAL
  Filled 2023-12-03 (×2): qty 1

## 2023-12-03 MED ORDER — ALBUTEROL SULFATE (2.5 MG/3ML) 0.083% IN NEBU
2.5000 mg | INHALATION_SOLUTION | RESPIRATORY_TRACT | Status: DC | PRN
Start: 2023-12-03 — End: 2023-12-05
  Administered 2023-12-04: 2.5 mg via RESPIRATORY_TRACT
  Filled 2023-12-03: qty 3

## 2023-12-03 MED ORDER — FUROSEMIDE 10 MG/ML IJ SOLN
80.0000 mg | Freq: Once | INTRAMUSCULAR | Status: AC
  Administered 2023-12-03: 80 mg via INTRAVENOUS
  Filled 2023-12-03: qty 8

## 2023-12-03 MED ORDER — BENZONATATE 100 MG PO CAPS
200.0000 mg | ORAL_CAPSULE | Freq: Once | ORAL | Status: AC
  Administered 2023-12-03: 200 mg via ORAL
  Filled 2023-12-03: qty 2

## 2023-12-03 MED ORDER — AMLODIPINE BESYLATE 5 MG PO TABS
10.0000 mg | ORAL_TABLET | Freq: Every day | ORAL | Status: DC
  Administered 2023-12-04 – 2023-12-05 (×2): 10 mg via ORAL
  Filled 2023-12-03 (×2): qty 2

## 2023-12-03 MED ORDER — DIPHENHYDRAMINE HCL 25 MG PO CAPS
25.0000 mg | ORAL_CAPSULE | Freq: Once | ORAL | Status: AC
  Administered 2023-12-03: 25 mg via ORAL
  Filled 2023-12-03: qty 1

## 2023-12-03 MED ORDER — HYDRALAZINE HCL 50 MG PO TABS
100.0000 mg | ORAL_TABLET | Freq: Three times a day (TID) | ORAL | Status: DC
  Administered 2023-12-03 – 2023-12-05 (×6): 100 mg via ORAL
  Filled 2023-12-03 (×6): qty 2

## 2023-12-03 MED ORDER — METOPROLOL SUCCINATE ER 50 MG PO TB24
100.0000 mg | ORAL_TABLET | Freq: Every day | ORAL | Status: DC
  Administered 2023-12-04 – 2023-12-05 (×2): 100 mg via ORAL
  Filled 2023-12-03 (×2): qty 2

## 2023-12-03 MED ORDER — FUROSEMIDE 10 MG/ML IJ SOLN
120.0000 mg | Freq: Three times a day (TID) | INTRAVENOUS | Status: DC
  Administered 2023-12-04: 120 mg via INTRAVENOUS
  Filled 2023-12-03 (×5): qty 12

## 2023-12-03 MED ORDER — ATORVASTATIN CALCIUM 40 MG PO TABS
80.0000 mg | ORAL_TABLET | Freq: Every day | ORAL | Status: DC
  Administered 2023-12-04 – 2023-12-05 (×2): 80 mg via ORAL
  Filled 2023-12-03 (×2): qty 2

## 2023-12-03 MED ORDER — PANTOPRAZOLE SODIUM 40 MG PO TBEC
40.0000 mg | DELAYED_RELEASE_TABLET | Freq: Every day | ORAL | Status: DC
  Administered 2023-12-04 – 2023-12-05 (×2): 40 mg via ORAL
  Filled 2023-12-03 (×2): qty 1

## 2023-12-03 MED ORDER — CALCITRIOL 0.25 MCG PO CAPS
0.5000 ug | ORAL_CAPSULE | Freq: Every day | ORAL | Status: DC
  Administered 2023-12-04 – 2023-12-05 (×2): 0.5 ug via ORAL
  Filled 2023-12-03 (×2): qty 2

## 2023-12-03 MED ORDER — SODIUM BICARBONATE 650 MG PO TABS
650.0000 mg | ORAL_TABLET | Freq: Two times a day (BID) | ORAL | Status: DC
  Administered 2023-12-03 – 2023-12-05 (×4): 650 mg via ORAL
  Filled 2023-12-03 (×4): qty 1

## 2023-12-03 MED ORDER — LINAGLIPTIN 5 MG PO TABS
5.0000 mg | ORAL_TABLET | Freq: Every day | ORAL | Status: DC
  Administered 2023-12-04 – 2023-12-05 (×2): 5 mg via ORAL
  Filled 2023-12-03 (×2): qty 1

## 2023-12-03 MED ORDER — ACETAMINOPHEN 500 MG PO TABS: 1000.0000 mg | ORAL_TABLET | Freq: Four times a day (QID) | ORAL | Status: DC | PRN

## 2023-12-03 MED ORDER — ALBUTEROL SULFATE (2.5 MG/3ML) 0.083% IN NEBU: 5.0000 mg | INHALATION_SOLUTION | Freq: Once | RESPIRATORY_TRACT | Status: DC

## 2023-12-03 MED ORDER — HYDRALAZINE HCL 20 MG/ML IJ SOLN: 5.0000 mg | INTRAMUSCULAR | Status: DC | PRN

## 2023-12-03 MED ORDER — APIXABAN 5 MG PO TABS
5.0000 mg | ORAL_TABLET | Freq: Two times a day (BID) | ORAL | Status: DC
  Administered 2023-12-03 – 2023-12-05 (×4): 5 mg via ORAL
  Filled 2023-12-03 (×4): qty 1

## 2023-12-03 NOTE — H&P (Signed)
 [facility] H&P   Patient Demographics:    Melissa [name], is a 48 y.o. female  MRN: [id number]   DOB - [birth year]  Admit Date - 12/03/2023  Outpatient Primary MD for the patient is Bucio, Melodi Sprung, FNP  Referring MD/NP/PA: PA Concha Deed  Outpatient Specialists: renal Dr Cristi Donalds    Patient coming from: home  Chief Complaint  Patient presents with   Shortness of Breath      HPI:    Melissa [name]  is a 48 y.o. 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  At eventually she will be started on dialysis, she is supposed to have a procedure scheduled by vascular surgery Dr. Zoila Hines 6/19 for adjustment of her fistula.   - Patient presents to ED secondary to worsening dyspnea, patient reports she ran out of her torsemide  80 mg twice daily, for which she was discharged on 10/01/2023, she has not been taking any torsemide  for the last 2 days, reports paroxysmal nocturnal dyspnea, exertional dyspnea, and orthopnea, she is on 2 L nasal cannula at baseline, reporting her oxygen saturation remains in the mid 80s, she denies significant peripheral edema, but reports cough mainly upon laying supine, she was seen by her PCP recently given antibiotics and steroids without much help. - in ED workup significant for volume overload and imaging, significantly elevated BNP, and she appears on volume overload, she was started on IV diuresis and [facility] hospitalist consulted to admit.   Review of systems:     A full 10 point Review of Systems was done, except as stated above, all other Review of Systems were negative.   With Past History of the following :    Past Medical  History:  Diagnosis Date   Anemia    Anxiety    Chronic kidney disease    Complication of anesthesia    Diabetes mellitus without complication (HCC)    Dysrhythmia 01/2019   PAF   GERD (gastroesophageal reflux disease)    History of hiatal hernia    Hypertension    PONV (postoperative nausea and vomiting)       Past Surgical History:  Procedure Laterality Date   AV FISTULA PLACEMENT Right 09/21/2023   Procedure: Right Brachiocephalic ARTERIOVENOUS (AV) FISTULA CREATION;  Surgeon: Carlene Che, MD;  Location: [facility] OR;  Service: Vascular;  Laterality: Right;   BIOPSY  02/02/2022   Procedure: BIOPSY;  Surgeon: Urban Garden, MD;  Location: [facility] SUITE;  Service: Gastroenterology;;   ESOPHAGOGASTRODUODENOSCOPY (EGD) WITH PROPOFOL  N/A 02/02/2022   Procedure: ESOPHAGOGASTRODUODENOSCOPY (EGD) WITH PROPOFOL ;  Surgeon: Urban Garden, MD;  Location: [facility];  Service: Gastroenterology;  Laterality: N/A;  240   NO  PAST SURGERIES        Social History:     Social History   Tobacco Use   Smoking status: Former    Types: Cigarettes    Passive exposure: Past   Smokeless tobacco: Never  Substance Use Topics   Alcohol use: Not Currently    Comment: occasional drink       History reviewed. No pertinent family history.    Home Medications:   Prior to Admission medications   Medication Sig Start Date End Date Taking? Authorizing Provider  amLODipine  (NORVASC ) 10 MG tablet Take 10 mg by mouth daily. 12/16/21  Yes [provider]  atorvastatin  (LIPITOR) 80 MG tablet Take 80 mg by mouth daily. 07/08/23  Yes [provider]  calcitRIOL  (ROCALTROL ) 0.5 MCG capsule Take 0.5 mcg by mouth daily. 09/16/23  Yes [provider]  citalopram  (CELEXA ) 20 MG tablet Take 20 mg by mouth daily. 05/07/19  Yes [provider]  ELIQUIS  5 MG TABS tablet Take 1 tablet (5 mg total) by mouth 2 (two) times daily. Resume from 04/30/2023 09/23/23  Yes  Cordie Deters, PA-C  ferrous sulfate  325 (65 FE) MG tablet Take 325 mg by mouth daily.   Yes [provider]  fluticasone (FLONASE) 50 MCG/ACT nasal spray Place 1 spray into both nostrils once as needed for allergies or rhinitis.   Yes [provider]  hydrALAZINE  (APRESOLINE ) 100 MG tablet Take 1 tablet (100 mg total) by mouth every 8 (eight) hours. 04/29/23  Yes Audria Leather, MD  Hypertonic Nasal Wash (NASAFLO PORCELAIN NASAL RINSE NA) Place 1 each into the nose once as needed (for congestion).   Yes [provider]  loratadine  (CLARITIN ) 10 MG tablet Take 10 mg by mouth daily as needed for allergies.   Yes [provider]  metoprolol  succinate (TOPROL -XL) 100 MG 24 hr tablet Take 1 tablet (100 mg total) by mouth daily. Take with or immediately following a meal. 04/30/23  Yes Audria Leather, MD  OXYGEN Inhale 2 L into the lungs daily.   Yes [provider]  pantoprazole  (PROTONIX ) 40 MG tablet Take 40 mg by mouth daily.   Yes [provider]  potassium chloride  SA (KLOR-CON  M) 20 MEQ tablet Take 1 tablet (20 mEq total) by mouth daily. 07/26/23  Yes Shah, Pratik D, DO  sodium bicarbonate  650 MG tablet Take 650 mg by mouth 2 (two) times daily. 07/08/23  Yes [provider]  torsemide  (DEMADEX ) 20 MG tablet Take 4 tablets (80 mg total) by mouth 2 (two) times daily. increase to 100 BID if patient has wt gain/edema/orthopnea. 10/02/23 12/03/23 Yes Sheikh, Omair Latif, DO  TRADJENTA 5 MG TABS tablet Take 5 mg by mouth daily. 07/08/23  Yes [provider]  acetaminophen  (TYLENOL ) 500 MG tablet Take 1,000 mg by mouth every 6 (six) hours as needed for moderate pain (pain score 4-6).    [provider]     Allergies:     Allergies  Allergen Reactions   Ozempic (0.25 Or 0.5 Mg-Dose) [Semaglutide(0.25 Or 0.5mg -Dos)] Nausea And Vomiting     Physical Exam:   Vitals  Blood pressure (!) 160/71, pulse 89, temperature 97.9 F  (36.6 C), temperature source Oral, resp. rate 20, height 5\' 5"  (1.651 m), weight (!) 140.6 kg, SpO2 94%, unknown if currently breastfeeding.   1. General Developed female, laying in bed, in no apparent distress  2. Normal affect and insight, Not Suicidal or Homicidal, Awake Alert, Oriented X 3.  3.  No F.N deficits, ALL C.Nerves Intact, Strength 5/5 all 4 extremities, Sensation intact all 4 extremities, Plantars down going.  4. Ears and Eyes appear Normal, Conjunctivae clear, PERRLA. Moist Oral Mucosa.  5. Supple Neck, No JVD, No cervical lymphadenopathy appriciated, No Carotid Bruits.  6. Symmetrical Chest wall movement, diminished air entry at the bases, crackles at lungs  7. RRR, No Gallops, Rubs or Murmurs, No Parasternal Heave.  +1 edema  8. Positive Bowel Sounds, Abdomen Soft, No tenderness, No organomegaly appriciated,No rebound -guarding or rigidity.  9.  No Cyanosis, Normal Skin Turgor, No Skin Rash or Bruise.  10. Good muscle tone,  joints appear normal , no effusions, Normal ROM.  11. No Palpable Lymph Nodes in Neck or Axillae     Data Review:    CBC Recent Labs  Lab 12/03/23 1701  WBC 7.3  HGB 7.2*  HCT 23.7*  PLT 255  MCV 85.6  MCH 26.0  MCHC 30.4  RDW 16.9*  LYMPHSABS 0.6*  MONOABS 0.5  EOSABS 0.3  BASOSABS 0.0   ------------------------------------------------------------------------------------------------------------------  Chemistries  Recent Labs  Lab 12/03/23 1701  NA 139  K 4.7  CL 109  CO2 19*  GLUCOSE 186*  BUN 61*  CREATININE 6.96*  CALCIUM  8.7*  MG 2.2   ------------------------------------------------------------------------------------------------------------------ estimated creatinine clearance is 14.3 mL/min (A) (by C-G formula based on SCr of 6.96 mg/dL (H)). ------------------------------------------------------------------------------------------------------------------ No results for input(s): "TSH", "T4TOTAL",  "T3FREE", "THYROIDAB" in the last 72 hours.  Invalid input(s): "FREET3"  Coagulation profile No results for input(s): "INR", "PROTIME" in the last 168 hours. ------------------------------------------------------------------------------------------------------------------- No results for input(s): "DDIMER" in the last 72 hours. -------------------------------------------------------------------------------------------------------------------  Cardiac Enzymes No results for input(s): "CKMB", "TROPONINI", "MYOGLOBIN" in the last 168 hours.  Invalid input(s): "CK" ------------------------------------------------------------------------------------------------------------------    Component Value Date/Time   BNP 572.0 (H) 12/03/2023 1701     ---------------------------------------------------------------------------------------------------------------  Urinalysis    Component Value Date/Time   COLORURINE YELLOW 09/26/2023 1400   APPEARANCEUR HAZY (A) 09/26/2023 1400   LABSPEC 1.012 09/26/2023 1400   PHURINE 6.0 09/26/2023 1400   GLUCOSEU 150 (A) 09/26/2023 1400   HGBUR NEGATIVE 09/26/2023 1400   BILIRUBINUR NEGATIVE 09/26/2023 1400   KETONESUR NEGATIVE 09/26/2023 1400   PROTEINUR >=300 (A) 09/26/2023 1400   NITRITE NEGATIVE 09/26/2023 1400   LEUKOCYTESUR NEGATIVE 09/26/2023 1400    ----------------------------------------------------------------------------------------------------------------   Imaging Results:    DG Chest Portable 1 View Result Date: 12/03/2023 CLINICAL DATA:  Shortness of breath. EXAM: PORTABLE CHEST 1 VIEW COMPARISON:  10/01/2023 FINDINGS: Examination limited by clothing artifact. Mild cardiac enlargement. Moderate vascular congestion and pulmonary edema. No pleural effusions or focal infiltrates. IMPRESSION: Cardiac enlargement, vascular congestion and pulmonary edema. Electronically Signed   By: Marrian Siva M.D.   On: 12/03/2023 16:58      Assessment  & Plan:    Principal Problem:   Acute on chronic diastolic (congestive) heart failure (HCC) Active Problems:   Atrial fibrillation, chronic (HCC)   Morbid obesity with BMI of 50.0-59.9, adult (HCC)   Essential hypertension   CKD (chronic kidney disease) stage 5, GFR less than 15 ml/min (HCC)   Anemia in CKD (chronic kidney disease)     -Acute on Chronic Diastolic CHF (congestive heart failure) (HCC) in the setting of CKD stage V, volume overload -Chronic respiratory failure with hypoxia on 2 L nasal cannula at baseline - This is most likely with her missing torsemide  for last 2 days, I have counseled her about importance of medication compliance. - Evidence  of volume overload, she received total of 160 of IV Lasix  while in ED, she had 450 cc urine output,. - Per ED discussion with nephrology on-call, continue with 120 mg IV every 8 hour Lasix , daily weights, strict ins and outs and fluid restrictions. - If lack of improvement on IV diuresis likely she will need to speed up her dialysis process, but so far she had a good response to Lasix  in the ED, and no other uremic symptoms or hyperkalemia  Atrial Fibrillation, chronic (HCC): - C/w Metoprolol  Succinate 100 mg po daily  - Continue with Eliquis  for anticoagulation .    Essential HTN:  - Continue with home regimen   HLD:  - Continue with statin  Depression and Anxiety:  - Continue with citalopram   Anemia of chronic kidney disease -She will benefit from IV iron  and procrit, deferred for renal -But for now once volume status improved she will certainly benefit from 1 unit PRBC transfusion to achieve at least hemoglobin of 8, hopefully 1 to 2 days when she is appropriately diuresed -No signs or symptoms of bleeding   GERD - Continue with PPI  Moebid Obesity:  Body mass index is 51.58 kg/m. Complicates overall prognosis and care.     DVT Prophylaxis on eliquis    AM Labs Ordered, also please review Full Orders  Family  Communication: Admission, patients condition and plan of care including tests being ordered have been discussed with the patient who indicate understanding and agree with the plan and Code Status.  Code Status code  Likely DC to home  Consults called: Renal consulted by ED  Admission status: Inpatient  Time spent in minutes : 70  minutes   Seena Dadds M.D on 12/03/2023 at 7:44 PM   [facility] - Office  [phone]

## 2023-12-03 NOTE — ED Triage Notes (Addendum)
 Pt with SOB on and off for weeks, worse for past 2 days, on home O2 at 2 L/M. Recent URI when seen by her PCP and was started on antibiotic. Productive cough at times. Pt missed two days worth of fluid pill, unable to get from pharmacy.

## 2023-12-03 NOTE — ED Provider Notes (Signed)
 [place] EMERGENCY DEPARTMENT AT [facility] Provider Note   CSN: [id number] Arrival date & time: 12/03/23  1623     History  Chief Complaint  Patient presents with   Shortness of Breath    Melissa [name] is a 48 y.o. female with a history including end-stage renal disease who has not yet started dialysis, has a new fistula right arm, type 2 diabetes, hypertension, history of CHF, atrial fibrillation on Eliquis  presenting for evaluation of increased shortness of breath.  She was  admitted to the hospital 3/31 for similar episode of CHF exacerbation  at which time she was placed on home oxygen at 2 L and sent home with torsemide  80mg  bid. She ran out of her torsemide  2 days ago, but really endorses the sob starting a day or 2 prior to that but has worsened the past 2 days.  She presents today secondary to 3-day history of increasing shortness of breath, today despite 2 L nasal cannula her oxygen was in the mid 80s.  She denies peripheral edema but has been coughing significantly with worsening symptoms when supine.  She was recently seen by her primary doctor and completed a course of antibiotics and steroids for presumptive bronchitis.  She also endorses allergy-like symptoms including postnasal drip which also contributes to her frequent cough.  The history is provided by the patient.       Home Medications Prior to Admission medications   Medication Sig Start Date End Date Taking? Authorizing Provider  amLODipine  (NORVASC ) 10 MG tablet Take 10 mg by mouth daily. 12/16/21  Yes [provider]  atorvastatin  (LIPITOR) 80 MG tablet Take 80 mg by mouth daily. 07/08/23  Yes [provider]  calcitRIOL  (ROCALTROL ) 0.5 MCG capsule Take 0.5 mcg by mouth daily. 09/16/23  Yes [provider]  citalopram  (CELEXA ) 20 MG tablet Take 20 mg by mouth daily. 05/07/19  Yes [provider]  ELIQUIS  5 MG TABS tablet Take 1 tablet (5 mg total) by mouth 2 (two)  times daily. Resume from 04/30/2023 09/23/23  Yes Cordie Deters, PA-C  ferrous sulfate  325 (65 FE) MG tablet Take 325 mg by mouth daily.   Yes [provider]  fluticasone (FLONASE) 50 MCG/ACT nasal spray Place 1 spray into both nostrils once as needed for allergies or rhinitis.   Yes [provider]  hydrALAZINE  (APRESOLINE ) 100 MG tablet Take 1 tablet (100 mg total) by mouth every 8 (eight) hours. 04/29/23  Yes Audria Leather, MD  Hypertonic Nasal Wash (NASAFLO PORCELAIN NASAL RINSE NA) Place 1 each into the nose once as needed (for congestion).   Yes [provider]  loratadine  (CLARITIN ) 10 MG tablet Take 10 mg by mouth daily as needed for allergies.   Yes [provider]  metoprolol  succinate (TOPROL -XL) 100 MG 24 hr tablet Take 1 tablet (100 mg total) by mouth daily. Take with or immediately following a meal. 04/30/23  Yes Audria Leather, MD  OXYGEN Inhale 2 L into the lungs daily.   Yes [provider]  pantoprazole  (PROTONIX ) 40 MG tablet Take 40 mg by mouth daily.   Yes [provider]  potassium chloride  SA (KLOR-CON  M) 20 MEQ tablet Take 1 tablet (20 mEq total) by mouth daily. 07/26/23  Yes Shah, Pratik D, DO  sodium bicarbonate  650 MG tablet Take 650 mg by mouth 2 (two) times daily. 07/08/23  Yes [provider]  torsemide  (DEMADEX ) 20 MG tablet Take 4 tablets (80 mg total) by  mouth 2 (two) times daily. increase to 100 BID if patient has wt gain/edema/orthopnea. 10/02/23 12/03/23 Yes Sheikh, Omair Latif, DO  TRADJENTA 5 MG TABS tablet Take 5 mg by mouth daily. 07/08/23  Yes [provider]  acetaminophen  (TYLENOL ) 500 MG tablet Take 1,000 mg by mouth every 6 (six) hours as needed for moderate pain (pain score 4-6).    [provider]      Allergies    Ozempic (0.25 or 0.5 mg-dose) [semaglutide(0.25 or 0.5mg -dos)]    Review of Systems   Review of Systems  Constitutional:  Negative for fever.  HENT:  Positive for  postnasal drip. Negative for congestion and sore throat.   Eyes: Negative.   Respiratory:  Positive for cough and shortness of breath. Negative for chest tightness.   Cardiovascular:  Negative for chest pain and leg swelling.  Gastrointestinal:  Negative for abdominal pain and nausea.  Genitourinary: Negative.   Musculoskeletal:  Negative for arthralgias, joint swelling and neck pain.  Skin: Negative.  Negative for rash and wound.  Neurological:  Negative for dizziness, weakness, light-headedness, numbness and headaches.  Psychiatric/Behavioral: Negative.      Physical Exam Updated Vital Signs BP (!) 154/71 (BP Location: Left Wrist)   Pulse 83   Temp 98.2 F (36.8 C) (Oral)   Resp 15   Ht 5\' 5"  (1.651 m)   Wt (!) 140.6 kg   LMP  (LMP Unknown)   SpO2 95%   BMI 51.58 kg/m  Physical Exam Vitals and nursing note reviewed.  Constitutional:      Appearance: She is well-developed.  HENT:     Head: Normocephalic and atraumatic.  Eyes:     Conjunctiva/sclera: Conjunctivae normal.  Cardiovascular:     Rate and Rhythm: Normal rate and regular rhythm.     Heart sounds: Normal heart sounds.  Pulmonary:     Effort: Pulmonary effort is normal.     Breath sounds: Examination of the right-lower field reveals rales. Examination of the left-lower field reveals rales. Rales present. No wheezing or rhonchi.     Comments: Rales bilateral bases, right >left Abdominal:     General: Bowel sounds are normal.     Palpations: Abdomen is soft.     Tenderness: There is no abdominal tenderness.  Musculoskeletal:        General: Normal range of motion.     Cervical back: Normal range of motion.     Comments: Trace ankle edema.  Skin:    General: Skin is warm and dry.  Neurological:     Mental Status: She is alert.     ED Results / Procedures / Treatments   Labs (all labs ordered are listed, but only abnormal results are displayed) Labs Reviewed  CBC WITH DIFFERENTIAL/PLATELET - Abnormal;  Notable for the following components:      Result Value   RBC 2.77 (*)    Hemoglobin 7.2 (*)    HCT 23.7 (*)    RDW 16.9 (*)    Lymphs Abs 0.6 (*)    All other components within normal limits  BASIC METABOLIC PANEL WITH GFR - Abnormal; Notable for the following components:   CO2 19 (*)    Glucose, Bld 186 (*)    BUN 61 (*)    Creatinine, Ser 6.96 (*)    Calcium  8.7 (*)    GFR, Estimated 7 (*)    All other components within normal limits  PHOSPHORUS - Abnormal; Notable for the following components:   Phosphorus  5.7 (*)    All other components within normal limits  BRAIN NATRIURETIC PEPTIDE - Abnormal; Notable for the following components:   B Natriuretic Peptide 572.0 (*)    All other components within normal limits  TROPONIN I (HIGH SENSITIVITY) - Abnormal; Notable for the following components:   Troponin I (High Sensitivity) 20 (*)    All other components within normal limits  MAGNESIUM  BASIC METABOLIC PANEL WITH GFR  CBC    EKG None  ED ECG REPORT   Date: 12/03/2023  Rate: 90  Rhythm: normal sinus rhythm  QRS Axis: normal  Intervals: normal  ST/T Wave abnormalities: flattened t waves   Conduction Disutrbances:none  Narrative Interpretation:   Old EKG Reviewed: unchanged  I have personally reviewed the EKG tracing and agree with the computerized printout as noted.  Radiology DG Chest Portable 1 View Result Date: 12/03/2023 CLINICAL DATA:  Shortness of breath. EXAM: PORTABLE CHEST 1 VIEW COMPARISON:  10/01/2023 FINDINGS: Examination limited by clothing artifact. Mild cardiac enlargement. Moderate vascular congestion and pulmonary edema. No pleural effusions or focal infiltrates. IMPRESSION: Cardiac enlargement, vascular congestion and pulmonary edema. Electronically Signed   By: Marrian Siva M.D.   On: 12/03/2023 16:58    Procedures Procedures    Medications Ordered in ED Medications  acetaminophen  (TYLENOL ) tablet 1,000 mg (has no administration in time  range)  amLODipine  (NORVASC ) tablet 10 mg (has no administration in time range)  atorvastatin  (LIPITOR) tablet 80 mg (has no administration in time range)  hydrALAZINE  (APRESOLINE ) tablet 100 mg (has no administration in time range)  metoprolol  succinate (TOPROL -XL) 24 hr tablet 100 mg (has no administration in time range)  calcitRIOL  (ROCALTROL ) capsule 0.5 mcg (has no administration in time range)  linagliptin (TRADJENTA) tablet 5 mg (has no administration in time range)  pantoprazole  (PROTONIX ) EC tablet 40 mg (has no administration in time range)  sodium bicarbonate  tablet 650 mg (has no administration in time range)  apixaban  (ELIQUIS ) tablet 5 mg (has no administration in time range)  ferrous sulfate  tablet 325 mg (has no administration in time range)  furosemide  (LASIX ) 120 mg in dextrose  5 % 50 mL IVPB (has no administration in time range)  albuterol  (PROVENTIL ) (2.5 MG/3ML) 0.083% nebulizer solution 2.5 mg (has no administration in time range)  hydrALAZINE  (APRESOLINE ) injection 5 mg (has no administration in time range)  furosemide  (LASIX ) injection 80 mg (80 mg Intravenous Given 12/03/23 1721)  diphenhydrAMINE  (BENADRYL ) capsule 25 mg (25 mg Oral Given 12/03/23 1819)  benzonatate (TESSALON) capsule 200 mg (200 mg Oral Given 12/03/23 1818)  furosemide  (LASIX ) injection 80 mg (80 mg Intravenous Given 12/03/23 1840)    ED Course/ Medical Decision Making/ A&P                                 Medical Decision Making Patient presenting with increased shortness of breath, she has a history of end-stage renal disease, not yet on dialysis, also with history of CHF, was recently on antibiotics and steroids for presumptive acute bronchitis, no fevers, her cough has had a clear sputum production.  Pneumonia less likely, clinically she appears to be in CHF with rales on her lung exam.  No wheezing is present.  She denies chest pain, her EKG is reassuring as is her troponin which is 20 today,  historically her troponins have been in the 32-35 range.  Her BNP is significantly more elevated than baseline at 572.  Her creatinine is stable at 6.96, she has a normal potassium level at 4.7.  She has a normocytic anemia with a hemoglobin of 7.2, her baseline here has been 7.3 to mid 8 range.  Patient was given Lasix  80 mg without any diuresis, after discussing patient with Dr. Jonah Negus I added an additional 80 mg after which patient has responded, by the time she left the department she had urinated approximately 450 cc of urine.  Amount and/or Complexity of Data Reviewed Labs: ordered.    Details: Per above. Radiology: ordered.    Details: Reviewed chest x-ray and agree with vascular congestion and cardiac enlargement. ECG/medicine tests: ordered.    Details: KG reviewed, rate 90, normal sinus rhythm, flattened T waves, no significant changes in comparison to prior EKG. Discussion of management or test interpretation with external provider(s): Pt discussed with Dr. Zana Hesselbach with nephrology including past hx of chf and advanced renal disease,  not yet diuresing despite lasix  80 mg given.  Advised give additional 80 mg lasix  now, then in 8 hours start lasix  120 mg q 8 hours.   Call placed to hospitalist for admission.  Discussed with Dr. Osborne Blazer who accepts patient for admission.  Risk Prescription drug management. Decision regarding hospitalization.           Final Clinical Impression(s) / ED Diagnoses Final diagnoses:  Acute on chronic congestive heart failure, unspecified heart failure type (HCC)  Chronic renal impairment, stage 5 [facility])    Rx / DC Orders ED Discharge Orders     None         Alyse July 12/03/23 2031    Early Glisson, MD 12/04/23 [phone]

## 2023-12-04 DIAGNOSIS — I5033 Acute on chronic diastolic (congestive) heart failure: Secondary | ICD-10-CM | POA: Diagnosis not present

## 2023-12-04 LAB — RESPIRATORY PANEL BY PCR

## 2023-12-04 LAB — BASIC METABOLIC PANEL WITH GFR
Anion gap: 8 (ref 5–15)
BUN: 65 mg/dL — ABNORMAL HIGH (ref 6–20)
CO2: 20 mmol/L — ABNORMAL LOW (ref 22–32)
Calcium: 8.5 mg/dL — ABNORMAL LOW (ref 8.9–10.3)
Chloride: 111 mmol/L (ref 98–111)
Creatinine, Ser: 7 mg/dL — ABNORMAL HIGH (ref 0.44–1.00)
GFR, Estimated: 7 mL/min — ABNORMAL LOW (ref >60)
Glucose, Bld: 91 mg/dL (ref 70–99)
Potassium: 4.7 mmol/L (ref 3.5–5.1)
Sodium: 139 mmol/L (ref 135–145)

## 2023-12-04 LAB — CBC
HCT: 22.3 % — ABNORMAL LOW (ref 36.0–46.0)
HCT: 22.2 % — ABNORMAL LOW (ref 36.0–46.0)
Hemoglobin: 6.8 g/dL — CL (ref 12.0–15.0)
Hemoglobin: 6.7 g/dL — CL (ref 12.0–15.0)
MCH: 26.5 pg (ref 26.0–34.0)
MCH: 26.2 pg (ref 26.0–34.0)
MCHC: 30.5 g/dL (ref 30.0–36.0)
MCHC: 30.2 g/dL (ref 30.0–36.0)
MCV: 86.8 fL (ref 80.0–100.0)
MCV: 86.7 fL (ref 80.0–100.0)
Platelets: 258 10*3/uL (ref 150–400)
Platelets: 247 10*3/uL (ref 150–400)
RBC: 2.57 MIL/uL — ABNORMAL LOW (ref 3.87–5.11)
RBC: 2.56 MIL/uL — ABNORMAL LOW (ref 3.87–5.11)
RDW: 17 % — ABNORMAL HIGH (ref 11.5–15.5)
RDW: 17.2 % — ABNORMAL HIGH (ref 11.5–15.5)
WBC: 6.7 10*3/uL (ref 4.0–10.5)
WBC: 6.3 10*3/uL (ref 4.0–10.5)
nRBC: 0 % (ref 0.0–0.2)
nRBC: 0 % (ref 0.0–0.2)

## 2023-12-04 LAB — PREPARE RBC (CROSSMATCH)

## 2023-12-04 LAB — RESP PANEL BY RT-PCR (RSV, FLU A&B, COVID)  RVPGX2
Influenza A by PCR: NEGATIVE
Influenza B by PCR: NEGATIVE
Resp Syncytial Virus by PCR: NEGATIVE
SARS Coronavirus 2 by RT PCR: NEGATIVE

## 2023-12-04 LAB — HEMOGLOBIN AND HEMATOCRIT, BLOOD
HCT: 24.6 % — ABNORMAL LOW (ref 36.0–46.0)
Hemoglobin: 7.5 g/dL — ABNORMAL LOW (ref 12.0–15.0)

## 2023-12-04 MED ORDER — POLYETHYLENE GLYCOL 3350 17 G PO PACK
17.0000 g | PACK | Freq: Every day | ORAL | Status: DC
  Administered 2023-12-04 – 2023-12-05 (×2): 17 g via ORAL
  Filled 2023-12-04 (×2): qty 1

## 2023-12-04 MED ORDER — GUAIFENESIN-DM 100-10 MG/5ML PO SYRP
5.0000 mL | ORAL_SOLUTION | ORAL | Status: DC | PRN
  Administered 2023-12-04: 5 mL via ORAL
  Filled 2023-12-04: qty 5

## 2023-12-04 MED ORDER — IPRATROPIUM BROMIDE 0.02 % IN SOLN
RESPIRATORY_TRACT | Status: AC
  Administered 2023-12-04: 0.5 mg
  Filled 2023-12-04: qty 2.5

## 2023-12-04 MED ORDER — SODIUM CHLORIDE 0.9% IV SOLUTION: Freq: Once | INTRAVENOUS | Status: AC

## 2023-12-04 MED ORDER — FUROSEMIDE 10 MG/ML IJ SOLN
120.0000 mg | Freq: Two times a day (BID) | INTRAVENOUS | Status: DC
  Filled 2023-12-04 (×2): qty 12

## 2023-12-04 MED ORDER — FUROSEMIDE 10 MG/ML IJ SOLN
INTRAMUSCULAR | Status: AC
  Filled 2023-12-04: qty 20

## 2023-12-04 MED ORDER — FUROSEMIDE 10 MG/ML IJ SOLN
160.0000 mg | Freq: Two times a day (BID) | INTRAVENOUS | Status: AC
  Administered 2023-12-04 – 2023-12-05 (×2): 160 mg via INTRAVENOUS
  Filled 2023-12-04 (×2): qty 16

## 2023-12-04 NOTE — Progress Notes (Signed)
 Progress Note   Patient: Melissa [name] ZOX:[id number] DOB: 1976-05-28 DOA: 12/03/2023     1 DOS: the patient was seen and examined on 12/04/2023   Brief hospital course: Per admission H&P HPI   Melissa [name]  is a 48 y.o. 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  At eventually she will be started on dialysis, she is supposed to have a procedure scheduled by vascular surgery Dr. Zoila Hines 6/19 for adjustment of her fistula.   - Patient presents to ED secondary to worsening dyspnea, patient reports she ran out of her torsemide  80 mg twice daily, for which she was discharged on 10/01/2023, she has not been taking any torsemide  for the last 2 days, reports paroxysmal nocturnal dyspnea, exertional dyspnea, and orthopnea, she is on 2 L nasal cannula at baseline, reporting her oxygen saturation remains in the mid 80s, she denies significant peripheral edema, but reports cough mainly upon laying supine, she was seen by her PCP recently given antibiotics and steroids without much help. - in ED workup significant for volume overload and imaging, significantly elevated BNP, and she appears on volume overload, she was started on IV diuresis and [facility] hospitalist consulted to admit.  Assessment and Plan: Acute on chronic diastolic HF in the setting of CKD stage V Patient presents with worsening dyspnea over the last few days. She was found to have an elevated BNP and her cxr had findings to suggest pulmonary edema. She was started on IV lasix  120mg  q8 hours given she is on torsemide  80mg  BID. She has not had much urine output. Her current weight is less 137.9kg than 140j.5kg on discharge 10/02/23  During previous hospitalization for similar symptoms she was transitioned to  lasix  160mg  BID.   -Continue IV diuresis  -Monitor renal function  -Daily weights, strict I and O  -May need to involve renal if symptoms do not improve with diuresis   CKD stage V  Patient follows with nephrology. Has AV fistula in RUE. Plan to start HD soon  Continue IV diuresis  -Avoid nephrotoxic agents      Latest Ref Rng & Units 12/04/2023   10:16 AM 12/04/2023    2:34 AM 12/03/2023    5:01 PM  CBC  WBC 4.0 - 10.5 K/uL 6.3  6.7  7.3   Hemoglobin 12.0 - 15.0 g/dL 6.7  6.8  7.2   Hematocrit 36.0 - 46.0 % 22.2  22.3  23.7   Platelets 150 - 400 K/uL 247  258  255    Anemia of CKD  Normocytic anemia  H/o Fe deficiency  Will give a unit of blood. Continue PO iron  supplementation May require ESA  Iron /TIBC/Ferritin/ %Sat    Component Value Date/Time   IRON  40 09/26/2023 2034   TIBC 242 (L) 09/26/2023 2034   FERRITIN 85 09/26/2023 2034   IRONPCTSAT 17 09/26/2023 2034   Gerd Continued PPI    Depression and anxiety  Continue home medications (citalopram )  HLD  Noted continue statin    HTN  Amldoipine  and hydralazine    Pafib  Continue metoprolol  and eliquis   Unclear if patient has had sleep study   Class IV obesity  BMI 50      Subjective: Feels her breathing is about the same. She doesn't feel she is significantly  volume overloaded as she usually has edematous lower extremities. She does note that she had some upper respiratory tract infection symptoms prior to the onset of her symptoms.  Physical Exam: Vitals:   12/03/23 2034 12/03/23 2349 12/04/23 0429 12/04/23 0500  BP: (!) 174/92 (!) 147/76 133/71   Pulse: 86 82 81   Resp: 19 (!) 22 20   Temp: 98.1 F (36.7 C) 97.8 F (36.6 C) 98 F (36.7 C)   TempSrc: Oral Oral Oral   SpO2: 94% 95% 95%   Weight: (!) 138.2 kg   (!) 137.9 kg  Height:       Physical Exam  Constitutional: In no distress.  Cardiovascular: Normal rate, regular rhythm. Trace bilateral lower extremity edema  Pulmonary: Non labored  breathing on [place], no wheezing or rales.   Abdominal: Soft. Normal bowel sounds. Non distended and non tender Musculoskeletal: Normal range of motion.     Neurological: Alert and oriented to person, place, and time. Non focal  Skin: Skin is warm and dry.   Data Reviewed:     Latest Ref Rng & Units 12/04/2023    2:34 AM 12/03/2023    5:01 PM 10/02/2023    4:14 AM  BMP  Glucose 70 - 99 mg/dL 91  132  440   BUN 6 - 20 mg/dL 65  61  82   Creatinine 0.44 - 1.00 mg/dL 1.02  7.25  3.66   Sodium 135 - 145 mmol/L 139  139  136   Potassium 3.5 - 5.1 mmol/L 4.7  4.7  4.0   Chloride 98 - 111 mmol/L 111  109  106   CO2 22 - 32 mmol/L 20  19  18    Calcium  8.9 - 10.3 mg/dL 8.5  8.7  8.6       Latest Ref Rng & Units 12/04/2023   10:16 AM 12/04/2023    2:34 AM 12/03/2023    5:01 PM  CBC  WBC 4.0 - 10.5 K/uL 6.3  6.7  7.3   Hemoglobin 12.0 - 15.0 g/dL 6.7  6.8  7.2   Hematocrit 36.0 - 46.0 % 22.2  22.3  23.7   Platelets 150 - 400 K/uL 247  258  255      Family Communication: Discussed with patient, she noted understanding   Disposition: Status is: Inpatient Remains inpatient appropriate because: IV diuresis, may require HD   Planned Discharge Destination: Home    Time spent: 35 minutes  Author: Joette Mustard, MD 12/04/2023 12:43 PM  For on call review [email or web address].

## 2023-12-04 NOTE — TOC Initial Note (Signed)
 Transition of Care [facility]) - Initial/Assessment Note    Patient Details  Name: Melissa [name] MRN: [id number] Date of Birth: 1975-11-11  [facility] CM/SW Contact:    Clara Crisp, LCSW Phone Number: 12/04/2023, 2:14 PM  Clinical Narrative:                 Patient from home with minor son. Independent at baseline. Follows HH diet. Has not been taking daily weights, has scale. Drives herself to appointments. TOC will continue to follow and address needs as they arise.   Expected Discharge Plan: Home/Self Care Barriers to Discharge: Continued Medical Work up   Patient Goals and CMS Choice Patient states their goals for this hospitalization and ongoing recovery are:: return home          Expected Discharge Plan and Services       Living arrangements for the past 2 months: Single Family Home                                      Prior Living Arrangements/Services Living arrangements for the past 2 months: Single Family Home Lives with:: Minor Children Patient language and need for interpreter reviewed:: Yes Do you feel safe going back to the place where you live?: Yes      Need for Family Participation in Patient Care: No (Comment) Care giver support system in place?: No (comment)   Criminal Activity/Legal Involvement Pertinent to Current Situation/Hospitalization: No - Comment as needed  Activities of Daily Living   ADL Screening (condition at time of admission) Independently performs ADLs?: Yes (appropriate for developmental age) Is the patient deaf or have difficulty hearing?: No Does the patient have difficulty seeing, even when wearing glasses/contacts?: No Does the patient have difficulty concentrating, remembering, or making decisions?: No  Permission Sought/Granted                  Emotional Assessment   Attitude/Demeanor/Rapport: Engaged   Orientation: : Oriented to Self, Oriented to Place, Oriented to  Time, Oriented to  Situation Alcohol / Substance Use: Not Applicable Psych Involvement: No (comment)  Admission diagnosis:  Acute on chronic diastolic (congestive) heart failure (HCC) [I50.33] Acute on chronic congestive heart failure, unspecified heart failure type [facility]) [I50.9] Patient Active Problem List   Diagnosis Date Noted   Acute on chronic diastolic (congestive) heart failure (HCC) 12/03/2023   Anemia in CKD (chronic kidney disease) 10/21/2023   Acute on chronic diastolic CHF (congestive heart failure) (HCC) 09/26/2023   CKD (chronic kidney disease) stage 5, GFR less than 15 ml/min (HCC) 09/26/2023   Pleural effusion due to CHF (congestive heart failure) (HCC) 07/23/2023   Elevated brain natriuretic peptide (BNP) level 07/23/2023   Acute respiratory failure with hypoxia (HCC) 07/23/2023   Essential hypertension 07/23/2023   Anemia of chronic renal failure 07/15/2023   Hypertensive emergency 04/17/2023   Acute kidney injury superimposed on stage 5 chronic kidney disease, not on chronic dialysis (HCC) 04/17/2023   Intractable vomiting 04/17/2023   DM (diabetes mellitus) (HCC) 04/17/2023   Morbid obesity with BMI of 50.0-59.9, adult (HCC) 04/17/2023   Atrial fibrillation, chronic (HCC) 04/17/2023   Nausea and vomiting 01/04/2022   Pain of upper abdomen 01/04/2022   Diabetes mellitus affecting pregnancy 01/20/2016   Advanced maternal age, primigravida, antepartum 01/20/2016   Advanced maternal age, 1st pregnancy    PCP:  Bucio, Elsa C, FNP Pharmacy:  [facility] [address] [place] - [address] Consuela Denier [address] [place] [address] Phone: [phone] Fax: [phone]  [facility] [address] [place] [address] Phone: [phone] Fax: [phone]     Social Drivers of Health (SDOH) Social History: SDOH Screenings   Food Insecurity: No Food Insecurity (12/03/2023)  Housing: Low Risk  (12/03/2023)  Transportation Needs: No Transportation Needs  (12/03/2023)  Utilities: Not At Risk (12/03/2023)  Depression (PHQ2-9): Low Risk  (07/22/2023)  Financial Resource Strain: Low Risk  (11/10/2021)   [facility] from [facility]  Tobacco Use: Medium Risk (12/03/2023)  Health Literacy: Low Risk  (10/19/2021)   Received from [facility]   SDOH Interventions:     Readmission Risk Interventions    07/24/2023    3:28 PM 07/24/2023   12:38 PM  Readmission Risk Prevention Plan  Transportation Screening Complete Complete  PCP or Specialist Appt within 3-5 Days  Complete  HRI or Home Care Consult  Complete  Social Work Consult for Recovery Care Planning/Counseling  Complete  Palliative Care Screening  Not Applicable  Medication Review Oceanographer)  Complete

## 2023-12-04 NOTE — Progress Notes (Signed)
 Notified Dr. Sunnie England of  patient's hemoglobin was 6.8.  Patient has had stable vitals signs and no overt signs of bleeding.  No new orders received at this time.  Will notify day shift of patient lab.

## 2023-12-04 NOTE — Plan of Care (Signed)

## 2023-12-05 DIAGNOSIS — I5033 Acute on chronic diastolic (congestive) heart failure: Secondary | ICD-10-CM | POA: Diagnosis not present

## 2023-12-05 LAB — CBC
HCT: 24.1 % — ABNORMAL LOW (ref 36.0–46.0)
Hemoglobin: 7.2 g/dL — ABNORMAL LOW (ref 12.0–15.0)
MCH: 25.5 pg — ABNORMAL LOW (ref 26.0–34.0)
MCHC: 29.9 g/dL — ABNORMAL LOW (ref 30.0–36.0)
MCV: 85.5 fL (ref 80.0–100.0)
Platelets: 240 10*3/uL (ref 150–400)
RBC: 2.82 MIL/uL — ABNORMAL LOW (ref 3.87–5.11)
RDW: 16.7 % — ABNORMAL HIGH (ref 11.5–15.5)
WBC: 6 10*3/uL (ref 4.0–10.5)
nRBC: 0.3 % — ABNORMAL HIGH (ref 0.0–0.2)

## 2023-12-05 LAB — BPAM RBC
Blood Product Expiration Date: 202507032359
ISSUE DATE / TIME: 202506081328
Unit Type and Rh: 5100

## 2023-12-05 LAB — FOLATE: Folate: 7.1 ng/mL (ref >5.9)

## 2023-12-05 LAB — TYPE AND SCREEN
ABO/RH(D): A POS
Antibody Screen: NEGATIVE
Unit division: 0

## 2023-12-05 LAB — BASIC METABOLIC PANEL WITH GFR
Anion gap: 12 (ref 5–15)
BUN: 65 mg/dL — ABNORMAL HIGH (ref 6–20)
CO2: 19 mmol/L — ABNORMAL LOW (ref 22–32)
Calcium: 8.4 mg/dL — ABNORMAL LOW (ref 8.9–10.3)
Chloride: 106 mmol/L (ref 98–111)
Creatinine, Ser: 7.15 mg/dL — ABNORMAL HIGH (ref 0.44–1.00)
GFR, Estimated: 7 mL/min — ABNORMAL LOW (ref >60)
Glucose, Bld: 89 mg/dL (ref 70–99)
Potassium: 4.7 mmol/L (ref 3.5–5.1)
Sodium: 137 mmol/L (ref 135–145)

## 2023-12-05 LAB — IRON AND TIBC
Iron: 36 ug/dL (ref 28–170)
Saturation Ratios: 13 % (ref 10.4–31.8)
TIBC: 275 ug/dL (ref 250–450)
UIBC: 239 ug/dL

## 2023-12-05 LAB — PREPARE RBC (CROSSMATCH)

## 2023-12-05 LAB — VITAMIN B12: Vitamin B-12: 403 pg/mL (ref 180–914)

## 2023-12-05 LAB — MAGNESIUM: Magnesium: 2.3 mg/dL (ref 1.7–2.4)

## 2023-12-05 MED ORDER — DARBEPOETIN ALFA 200 MCG/0.4ML IJ SOSY
200.0000 ug | PREFILLED_SYRINGE | Freq: Once | INTRAMUSCULAR | Status: AC
  Administered 2023-12-05: 200 ug via SUBCUTANEOUS
  Filled 2023-12-05: qty 0.4

## 2023-12-05 MED ORDER — DARBEPOETIN ALFA 150 MCG/0.3ML IJ SOSY
150.0000 ug | PREFILLED_SYRINGE | Freq: Once | INTRAMUSCULAR | Status: DC
  Filled 2023-12-05: qty 0.3

## 2023-12-05 MED ORDER — TORSEMIDE 20 MG PO TABS: 80.0000 mg | ORAL_TABLET | Freq: Two times a day (BID) | ORAL | 1 refills | Status: DC

## 2023-12-05 MED ORDER — SODIUM CHLORIDE 0.9% IV SOLUTION
Freq: Once | INTRAVENOUS | Status: AC
Start: 2023-12-05 — End: 2023-12-05

## 2023-12-05 MED ORDER — IRON SUCROSE 200 MG IVPB - SIMPLE MED
200.0000 mg | Freq: Every day | Status: DC
  Administered 2023-12-05: 200 mg via INTRAVENOUS
  Filled 2023-12-05: qty 200
  Filled 2023-12-05: qty 110

## 2023-12-05 NOTE — Progress Notes (Signed)
 Heart Failure Navigator Progress Note  Assessed for Heart & Vascular TOC clinic readiness.  Unfortunately, patient does not meet criteria due to ESRD with plans to start Hemodialysis soon.   Navigator will sign off at this time.  Celedonio Coil, RN, BSN [facility] Heart Failure Navigator Secure Chat Only

## 2023-12-05 NOTE — Discharge Summary (Signed)
 Physician Discharge Summary   Patient: Melissa [name] MRN: [id number] DOB: 03-31-1976  Admit date:     12/03/2023  Discharge date: 12/05/23  Discharge Physician: Bobbetta Burnet   PCP: Bucio, Elsa C, FNP   Recommendations at discharge:   Follow with a nephrologist within 1-2 weeks  Continue recommendation of nephrologist may need more IV iron  infusion Follow-up with PCP in 2-4 weeks  Discharge Diagnoses: Principal Problem:   Acute on chronic diastolic (congestive) heart failure (HCC) Active Problems:   Atrial fibrillation, chronic (HCC)   Morbid obesity with BMI of 50.0-59.9, adult (HCC)   Essential hypertension   CKD (chronic kidney disease) stage 5, GFR less than 15 ml/min (HCC)   Anemia in CKD (chronic kidney disease)  Melissa [name]  is a 48 y.o. 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  At eventually she will be started on dialysis, she is supposed to have a procedure scheduled by vascular surgery Dr. Zoila Hines 6/19 for adjustment of her fistula.   - Patient presents to ED secondary to worsening dyspnea, patient reports she ran out of her torsemide  80 mg twice daily, for which she was discharged on 10/01/2023, she has not been taking any torsemide  for the last 2 days, reports paroxysmal nocturnal dyspnea, exertional dyspnea, and orthopnea, she is on 2 L nasal cannula at baseline, reporting her oxygen saturation remains in the mid 80s, she denies significant peripheral edema, but reports cough mainly upon laying supine, she was seen by her PCP recently given antibiotics and steroids without much help. - in ED workup significant for volume overload and imaging, significantly elevated BNP, and she appears on volume overload, she was started on IV  diuresis and [facility] hospitalist consulted to admit.    Acute on chronic diastolic HF in the setting of CKD stage V -Proved shortness of breath - POA: Dyspnea, elevated BNP and her cxr had findings to suggest pulmonary edema. She was started on IV lasix  120mg  q8 hours given she is on torsemide  80mg  BID.   -Nephrologist Dr. Jearldine Mina was consulted, recommended to resume torsemide  at 80 mg p.o. twice daily    -S/p IV Lasix  treatment -Daily weights, strict I and O     CKD stage V  Patient follows with nephrology. Has AV fistula in RUE. Plan to start HD soon  Continue IV diuresis  -Avoid nephrotoxic agents  Lab Results  Component Value Date   CREATININE 7.15 (H) 12/05/2023   CREATININE 7.00 (H) 12/04/2023   CREATININE 6.96 (H) 12/03/2023    Anemia of chronic disease -CKD stage V        Latest Ref Rng & Units 12/04/2023   10:16 AM 12/04/2023    2:34 AM 12/03/2023    5:01 PM  CBC  WBC 4.0 - 10.5 K/uL 6.3  6.7  7.3   Hemoglobin 12.0 - 15.0 g/dL 6.7  6.8  7.2   Hematocrit 36.0 - 46.0 % 22.2  22.3  23.7   Platelets 150 - 400 K/uL 247  258  255     Anemia of CKD  Normocytic anemia  H/o Fe deficiency  Received 2U PRBC transfusion on this admission  Continue PO iron  supplementation- will  require ESA  Iron /TIBC/Ferritin/ %Sat Labs (Brief)          Component  Value Date/Time    IRON  40 09/26/2023 2034    TIBC 242 (L) 09/26/2023 2034    FERRITIN 85 09/26/2023 2034    IRONPCTSAT 17 09/26/2023 2034      S/p total of 2U PRBC blood transfusion during this admission Continued PPI      Depression and anxiety  Continue home medications (citalopram )   HLD  Noted continue statin      HTN  Amldoipine  and hydralazine     Pafib  Continue metoprolol  and eliquis   Unclear if patient has had sleep study     Class IV obesity  BMI 50     Disposition: Home Diet recommendation:  Discharge Diet Orders (From admission, onward)     Start     Ordered   12/05/23 0000  Diet - low  sodium heart healthy        12/05/23 1057           Renal diet DISCHARGE MEDICATION: Allergies as of 12/05/2023       Reactions   Ozempic (0.25 Or 0.5 Mg-dose) [semaglutide(0.25 Or 0.5mg -dos)] Nausea And Vomiting        Medication List     TAKE these medications    acetaminophen  500 MG tablet Commonly known as: TYLENOL  Take 1,000 mg by mouth every 6 (six) hours as needed for moderate pain (pain score 4-6).   amLODipine  10 MG tablet Commonly known as: NORVASC  Take 10 mg by mouth daily.   atorvastatin  80 MG tablet Commonly known as: LIPITOR Take 80 mg by mouth daily.   calcitRIOL  0.5 MCG capsule Commonly known as: ROCALTROL  Take 0.5 mcg by mouth daily.   citalopram  20 MG tablet Commonly known as: CELEXA  Take 20 mg by mouth daily.   Eliquis  5 MG Tabs tablet Generic drug: apixaban  Take 1 tablet (5 mg total) by mouth 2 (two) times daily. Resume from 04/30/2023   ferrous sulfate  325 (65 FE) MG tablet Take 325 mg by mouth daily.   fluticasone 50 MCG/ACT nasal spray Commonly known as: FLONASE Place 1 spray into both nostrils once as needed for allergies or rhinitis.   hydrALAZINE  100 MG tablet Commonly known as: APRESOLINE  Take 1 tablet (100 mg total) by mouth every 8 (eight) hours.   loratadine  10 MG tablet Commonly known as: CLARITIN  Take 10 mg by mouth daily as needed for allergies.   metoprolol  succinate 100 MG 24 hr tablet Commonly known as: TOPROL -XL Take 1 tablet (100 mg total) by mouth daily. Take with or immediately following a meal.   NASAFLO PORCELAIN NASAL RINSE NA Place 1 each into the nose once as needed (for congestion).   OXYGEN Inhale 2 L into the lungs daily.   potassium chloride  SA 20 MEQ tablet Commonly known as: KLOR-CON  M Take 1 tablet (20 mEq total) by mouth daily.   Protonix  40 MG tablet Generic drug: pantoprazole  Take 40 mg by mouth daily.   sodium bicarbonate  650 MG tablet Take 650 mg by mouth 2 (two) times daily.    torsemide  20 MG tablet Commonly known as: DEMADEX  Take 4 tablets (80 mg total) by mouth 2 (two) times daily. increase to 100 BID if patient has wt gain/edema/orthopnea.   Tradjenta 5 MG Tabs tablet Generic drug: linagliptin Take 5 mg by mouth daily.        Discharge Exam: Filed Weights   12/03/23 2034 12/04/23 0500 12/05/23 0447  Weight: (!) 138.2 kg (!) 137.9 kg (!) 139.2 kg        General:  AAO x 3,  cooperative, no distress;   HEENT:  Normocephalic, PERRL, otherwise with in Normal limits   Neuro:  CNII-XII intact. , normal motor and sensation, reflexes intact   Lungs:   Clear to auscultation BL, Respirations unlabored,  No wheezes / crackles  Cardio:    S1/S2, RRR, No murmure, No Rubs or Gallops   Abdomen:  Soft, non-tender, bowel sounds active all four quadrants, no guarding or peritoneal signs.  Muscular  skeletal:  Limited exam -global generalized weaknesses - in bed, able to move all 4 extremities,   2+ pulses,  symmetric, No pitting edema  Skin:  Dry, warm to touch, negative for any Rashes,  Wounds: Please see nursing documentation          Condition at discharge: fair  The results of significant diagnostics from this hospitalization (including imaging, microbiology, ancillary and laboratory) are listed below for reference.   Imaging Studies: DG Chest Portable 1 View Result Date: 12/03/2023 CLINICAL DATA:  Shortness of breath. EXAM: PORTABLE CHEST 1 VIEW COMPARISON:  10/01/2023 FINDINGS: Examination limited by clothing artifact. Mild cardiac enlargement. Moderate vascular congestion and pulmonary edema. No pleural effusions or focal infiltrates. IMPRESSION: Cardiac enlargement, vascular congestion and pulmonary edema. Electronically Signed   By: Marrian Siva M.D.   On: 12/03/2023 16:58   VAS US  DUPLEX DIALYSIS ACCESS (AVF, AVG) Result Date: 11/15/2023 DIALYSIS ACCESS Patient Name:  Melissa [name]  Date of Exam:   11/15/2023 Medical Rec #: [id number]        Accession #:    [id number] Date of Birth: [birth year]       Patient Gender: F Patient Age:   66 years Exam Location:  [address] Procedure:      VAS US  DUPLEX DIALYSIS ACCESS (AVF, AVG) Referring Phys: Runell Countryman --------------------------------------------------------------------------------  Access Site: Right Upper Extremity. Access Type: Brachial-cephalic AVF. History: 09/21/23: Right BC AVF. Performing Technologist: Helon Lobos RVT  Examination Guidelines: A complete evaluation includes B-mode imaging, spectral Doppler, color Doppler, and power Doppler as needed of all accessible portions of each vessel. Unilateral testing is considered an integral part of a complete examination. Limited examinations for reoccurring indications may be performed as noted.  Findings: +--------------------+----------+-----------------+--------+ AVF                 PSV (cm/s)Flow Vol (mL/min)Comments +--------------------+----------+-----------------+--------+ Native artery inflow   395          2188                +--------------------+----------+-----------------+--------+ AVF Anastomosis        648                              +--------------------+----------+-----------------+--------+  +------------+---------+------------+----------+-------------------------------+ OUTFLOW VEIN   PSV     Diameter  Depth (cm)           Describe                          (cm/s)      (cm)                                              +------------+---------+------------+----------+-------------------------------+ Prox UA        105       0.62  3.40            branch 4.7 cm          +------------+---------+------------+----------+-------------------------------+ Mid UA         294       0.98       1.60    branch confluence 0.4 cm 431                                                           cm/s               +------------+---------+------------+----------+-------------------------------+  Dist UA        202       0.96       1.31            branch 0.8 cm          +------------+---------+------------+----------+-------------------------------+ AC Fossa       271       1.00       0.64                                   +------------+---------+------------+----------+-------------------------------+  Summary: Patent AVF. FLow volume is 2188 ml/min. Branches as above.  *See table(s) above for measurements and observations.  Diagnosing physician: Jimmye Moulds MD Electronically signed by Jimmye Moulds MD on 11/15/2023 at 9:32:26 AM.    --------------------------------------------------------------------------------   Final     Microbiology: Results for orders placed or performed during the hospital encounter of 12/03/23  Resp panel by RT-PCR (RSV, Flu A&B, Covid) Anterior Nasal Swab     Status: None   Collection Time: 12/04/23  3:00 PM   Specimen: Anterior Nasal Swab  Result Value Ref Range Status   SARS Coronavirus 2 by RT PCR NEGATIVE NEGATIVE Final    Comment: (NOTE) SARS-CoV-2 target nucleic acids are NOT DETECTED.  The SARS-CoV-2 RNA is generally detectable in upper respiratory specimens during the acute phase of infection. The lowest concentration of SARS-CoV-2 viral copies this assay can detect is 138 copies/mL. A negative result does not preclude SARS-Cov-2 infection and should not be used as the sole basis for treatment or other patient management decisions. A negative result may occur with  improper specimen collection/handling, submission of specimen other than nasopharyngeal swab, presence of viral mutation(s) within the areas targeted by this assay, and inadequate number of viral copies(<138 copies/mL). A negative result must be combined with clinical observations, patient history, and epidemiological information. The expected result is Negative.  Fact Sheet for Patients:  BloggerCourse.com  Fact Sheet for Healthcare  Providers:  SeriousBroker.it  This test is no t yet approved or cleared by the [place]  FDA and  has been authorized for detection and/or diagnosis of SARS-CoV-2 by FDA under an Emergency Use Authorization (EUA). This EUA will remain  in effect (meaning this test can be used) for the duration of the COVID-19 declaration under Section 564(b)(1) of the Act, 21 U.S.C.section 360bbb-3(b)(1), unless the authorization is terminated  or revoked sooner.       Influenza A by PCR NEGATIVE NEGATIVE Final   Influenza B by PCR NEGATIVE NEGATIVE Final    Comment: (NOTE) The Xpert Xpress SARS-CoV-2/FLU/RSV plus assay is intended as an aid in the diagnosis of  influenza from Nasopharyngeal swab specimens and should not be used as a sole basis for treatment. Nasal washings and aspirates are unacceptable for Xpert Xpress SARS-CoV-2/FLU/RSV testing.  Fact Sheet for Patients: BloggerCourse.com  Fact Sheet for Healthcare Providers: SeriousBroker.it  This test is not yet approved or cleared by the [place]  FDA and has been authorized for detection and/or diagnosis of SARS-CoV-2 by FDA under an Emergency Use Authorization (EUA). This EUA will remain in effect (meaning this test can be used) for the duration of the COVID-19 declaration under Section 564(b)(1) of the Act, 21 U.S.C. section 360bbb-3(b)(1), unless the authorization is terminated or revoked.     Resp Syncytial Virus by PCR NEGATIVE NEGATIVE Final    Comment: (NOTE) Fact Sheet for Patients: BloggerCourse.com  Fact Sheet for Healthcare Providers: SeriousBroker.it  This test is not yet approved or cleared by the [place]  FDA and has been authorized for detection and/or diagnosis of SARS-CoV-2 by FDA under an Emergency Use Authorization (EUA). This EUA will remain in effect (meaning this test can be  used) for the duration of the COVID-19 declaration under Section 564(b)(1) of the Act, 21 U.S.C. section 360bbb-3(b)(1), unless the authorization is terminated or revoked.  Performed at [facility], [address] [place], [place] [address]   Respiratory (~20 pathogens) panel by PCR     Status: None   Collection Time: 12/04/23  4:00 PM   Specimen: Nasopharyngeal Swab; Respiratory  Result Value Ref Range Status   Adenovirus NOT DETECTED NOT DETECTED Final   Coronavirus 229E NOT DETECTED NOT DETECTED Final    Comment: (NOTE) The Coronavirus on the Respiratory Panel, DOES NOT test for the novel  Coronavirus (2019 nCoV)    Coronavirus HKU1 NOT DETECTED NOT DETECTED Final   Coronavirus NL63 NOT DETECTED NOT DETECTED Final   Coronavirus OC43 NOT DETECTED NOT DETECTED Final   Metapneumovirus NOT DETECTED NOT DETECTED Final   Rhinovirus / Enterovirus NOT DETECTED NOT DETECTED Final   Influenza A NOT DETECTED NOT DETECTED Final   Influenza B NOT DETECTED NOT DETECTED Final   Parainfluenza Virus 1 NOT DETECTED NOT DETECTED Final   Parainfluenza Virus 2 NOT DETECTED NOT DETECTED Final   Parainfluenza Virus 3 NOT DETECTED NOT DETECTED Final   Parainfluenza Virus 4 NOT DETECTED NOT DETECTED Final   Respiratory Syncytial Virus NOT DETECTED NOT DETECTED Final   Bordetella pertussis NOT DETECTED NOT DETECTED Final   Bordetella Parapertussis NOT DETECTED NOT DETECTED Final   Chlamydophila pneumoniae NOT DETECTED NOT DETECTED Final   Mycoplasma pneumoniae NOT DETECTED NOT DETECTED Final    Comment: Performed at [facility], [address], [place], [place] [address]    Labs: CBC: Recent Labs  Lab 12/03/23 1701 12/04/23 0234 12/04/23 1016 12/04/23 1932 12/05/23 0450  WBC 7.3 6.7 6.3  --  6.0  NEUTROABS 5.9  --   --   --   --   HGB 7.2* 6.8* 6.7* 7.5* 7.2*  HCT 23.7* 22.3* 22.2* 24.6* 24.1*  MCV 85.6 86.8 86.7  --  85.5  PLT 255 258 247  --  240   Basic Metabolic  Panel: Recent Labs  Lab 12/03/23 1701 12/04/23 0234 12/05/23 0450  NA 139 139 137  K 4.7 4.7 4.7  CL 109 111 106  CO2 19* 20* 19*  GLUCOSE 186* 91 89  BUN 61* 65* 65*  CREATININE 6.96* 7.00* 7.15*  CALCIUM  8.7* 8.5* 8.4*  MG 2.2  --  2.3  PHOS 5.7*  --   --  Liver Function Tests: No results for input(s): "AST", "ALT", "ALKPHOS", "BILITOT", "PROT", "ALBUMIN" in the last 168 hours. CBG: No results for input(s): "GLUCAP" in the last 168 hours.  Discharge time spent: greater than 40 minutes.  Signed: Bobbetta Burnet, MD [facility] 12/05/2023

## 2023-12-05 NOTE — Consult Note (Signed)
 Emmagene Ortner Wojtaszek Admit Date: 12/03/2023 12/05/2023 Charletta Cons Requesting Physician:  Eilene Grater MD  Reason for Consult:  CKD5, Anemia, SOB  HPI:  82F PMH as below including CKD 5 with maturing right upper arm fistula due for revision later this month, hypertension, atrial fibrillation, secondary hyperparathyroidism hyperlipidemia, obesity who presented to the ED 6/7 with progressive dyspnea after running out of her diuretics with inability to get the prescription refilled.  She does use 2 L of nasal cannula oxygen at home but has no history of COPD.    At presentation creatinine was 6.96 which is similar to values from 2 months ago and is stable here today.  K4.7, bicarbonate 19.  Presenting hemoglobin is 6.8 and she has received 1 unit of packed red cells with a hemoglobin increased to 7.2.    Presenting chest x-ray, independently reviewed reviewed with findings of pulmonary edema and vascular congestion.  The patient was not having much edema prior to presentation, dyspnea was more pronounced.  She currently is receiving furosemide  1 year and 60 mg twice daily and weights have been variable may be down 1 kg total.  Yesterday she achieved 1 L net negative fluid status if accurate.  She remains dyspneic when she gets up and ambulates especially.  Iron  studies on 6/9, after transfusion had a TSAT of 13% and previous ferritin was less than 100.  Previous efforts have been made to schedule outpatient ESA therapy but this has not been effective, I am not sure why.  Will need to look into this.  AV fistula for revision/superficialization 6/18 with [facility].  Today she has a good appetite with no nausea/vomiting.  No significant edema.  She does not see any reason for dialysis initiation and she would like to wait until her fistula is ready if possible.  She does have close follow-up with [facility] on 12/19/2023.   Creatinine, Ser (mg/dL)  Date Value  16/03/9603 7.15 (H)  12/04/2023 7.00 (H)  12/03/2023 6.96  (H)  10/02/2023 6.92 (H)  10/01/2023 6.93 (H)  09/30/2023 6.66 (H)  09/30/2023 6.60 (H)  09/29/2023 6.48 (H)  09/29/2023 6.43 (H)  09/28/2023 6.23 (H)  ] I/Os: I/O last 3 completed shifts: In: 1506.2 [P.O.:1440; IV Piggyback:66.2] Out: 2550 [Urine:2550]   ROS NSAIDS: No use IV Contrast no exposure TMP/SMX no exposure Hypotension not present Balance of 12 systems is negative w/ exceptions as above  PMH  Past Medical History:  Diagnosis Date   Anemia    Anxiety    Chronic kidney disease    Complication of anesthesia    Diabetes mellitus without complication (HCC)    Dysrhythmia 01/2019   PAF   GERD (gastroesophageal reflux disease)    History of hiatal hernia    Hypertension    PONV (postoperative nausea and vomiting)    PSH  Past Surgical History:  Procedure Laterality Date   AV FISTULA PLACEMENT Right 09/21/2023   Procedure: Right Brachiocephalic ARTERIOVENOUS (AV) FISTULA CREATION;  Surgeon: Carlene Che, MD;  Location: [facility] OR;  Service: Vascular;  Laterality: Right;   BIOPSY  02/02/2022   Procedure: BIOPSY;  Surgeon: Urban Garden, MD;  Location: [facility] SUITE;  Service: Gastroenterology;;   ESOPHAGOGASTRODUODENOSCOPY (EGD) WITH PROPOFOL  N/A 02/02/2022   Procedure: ESOPHAGOGASTRODUODENOSCOPY (EGD) WITH PROPOFOL ;  Surgeon: Urban Garden, MD;  Location: [facility];  Service: Gastroenterology;  Laterality: N/A;  240   NO PAST SURGERIES     FH History reviewed. No pertinent family history. SH  reports that she has quit smoking. Her smoking use included cigarettes. She has been exposed to tobacco smoke. She has never used smokeless tobacco. She reports that she does not currently use alcohol. She reports that she does not use drugs. Allergies  Allergies  Allergen Reactions   Ozempic (0.25 Or 0.5 Mg-Dose) [Semaglutide(0.25 Or 0.5mg -Dos)] Nausea And Vomiting   Home medications Prior to Admission medications   Medication Sig Start Date End  Date Taking? Authorizing Provider  amLODipine  (NORVASC ) 10 MG tablet Take 10 mg by mouth daily. 12/16/21  Yes [provider]  atorvastatin  (LIPITOR) 80 MG tablet Take 80 mg by mouth daily. 07/08/23  Yes [provider]  calcitRIOL  (ROCALTROL ) 0.5 MCG capsule Take 0.5 mcg by mouth daily. 09/16/23  Yes [provider]  citalopram  (CELEXA ) 20 MG tablet Take 20 mg by mouth daily. 05/07/19  Yes [provider]  ELIQUIS  5 MG TABS tablet Take 1 tablet (5 mg total) by mouth 2 (two) times daily. Resume from 04/30/2023 09/23/23  Yes Cordie Deters, PA-C  ferrous sulfate  325 (65 FE) MG tablet Take 325 mg by mouth daily.   Yes [provider]  fluticasone (FLONASE) 50 MCG/ACT nasal spray Place 1 spray into both nostrils once as needed for allergies or rhinitis.   Yes [provider]  hydrALAZINE  (APRESOLINE ) 100 MG tablet Take 1 tablet (100 mg total) by mouth every 8 (eight) hours. 04/29/23  Yes Audria Leather, MD  Hypertonic Nasal Wash (NASAFLO PORCELAIN NASAL RINSE NA) Place 1 each into the nose once as needed (for congestion).   Yes [provider]  loratadine  (CLARITIN ) 10 MG tablet Take 10 mg by mouth daily as needed for allergies.   Yes [provider]  metoprolol  succinate (TOPROL -XL) 100 MG 24 hr tablet Take 1 tablet (100 mg total) by mouth daily. Take with or immediately following a meal. 04/30/23  Yes Audria Leather, MD  OXYGEN Inhale 2 L into the lungs daily.   Yes [provider]  pantoprazole  (PROTONIX ) 40 MG tablet Take 40 mg by mouth daily.   Yes [provider]  potassium chloride  SA (KLOR-CON  M) 20 MEQ tablet Take 1 tablet (20 mEq total) by mouth daily. 07/26/23  Yes Shah, Pratik D, DO  sodium bicarbonate  650 MG tablet Take 650 mg by mouth 2 (two) times daily. 07/08/23  Yes [provider]  torsemide  (DEMADEX ) 20 MG tablet Take 4 tablets (80 mg total) by mouth 2 (two) times daily. increase to 100 BID if  patient has wt gain/edema/orthopnea. 10/02/23 12/03/23 Yes Sheikh, Omair Latif, DO  TRADJENTA 5 MG TABS tablet Take 5 mg by mouth daily. 07/08/23  Yes [provider]  acetaminophen  (TYLENOL ) 500 MG tablet Take 1,000 mg by mouth every 6 (six) hours as needed for moderate pain (pain score 4-6).    [provider]    Current Medications Scheduled Meds:  amLODipine   10 mg Oral Daily   apixaban   5 mg Oral BID   atorvastatin   80 mg Oral Daily   calcitRIOL   0.5 mcg Oral Daily   ferrous sulfate   325 mg Oral Daily   hydrALAZINE   100 mg Oral Q8H   linagliptin  5 mg Oral Daily   metoprolol  succinate  100 mg Oral Daily   pantoprazole   40 mg Oral Daily   polyethylene glycol  17 g Oral Daily   sodium bicarbonate   650 mg Oral BID   Continuous Infusions:  furosemide  160 mg (12/05/23 0922)   PRN  Meds:.acetaminophen , albuterol , guaiFENesin-dextromethorphan, hydrALAZINE   CBC Recent Labs  Lab 12/03/23 1701 12/04/23 0234 12/04/23 1016 12/04/23 1932 12/05/23 0450  WBC 7.3 6.7 6.3  --  6.0  NEUTROABS 5.9  --   --   --   --   HGB 7.2* 6.8* 6.7* 7.5* 7.2*  HCT 23.7* 22.3* 22.2* 24.6* 24.1*  MCV 85.6 86.8 86.7  --  85.5  PLT 255 258 247  --  240   Basic Metabolic Panel Recent Labs  Lab 12/03/23 1701 12/04/23 0234 12/05/23 0450  NA 139 139 137  K 4.7 4.7 4.7  CL 109 111 106  CO2 19* 20* 19*  GLUCOSE 186* 91 89  BUN 61* 65* 65*  CREATININE 6.96* 7.00* 7.15*  CALCIUM  8.7* 8.5* 8.4*  PHOS 5.7*  --   --     Physical Exam  Blood pressure 136/70, pulse 78, temperature 98.4 F (36.9 C), temperature source Oral, resp. rate 16, height 5\' 5"  (1.651 m), weight (!) 139.2 kg, SpO2 97%, unknown if currently breastfeeding. GEN: NAD, obese, comfortable appearing ENT: NCAT, nasal cannula in place EYES: EOMI CV: Regular rate and rhythm, normal S1 and S2, no rub PULM: Diminished in the bases but otherwise clear throughout without crackles or wheezing ABD: Limited because of adiposity,  soft SKIN: No rashes or lesions EXT: No significant lower extremity edema RUE AVF with bruit  Assessment 38F CKD5 presented with worsened dyspnea likely multifactorial including symptomatic anemia and AoC HFpEF.  No other significant symptoms of uremia.  CKD 5: No strong indication for dialysis initiation and would be great if we can wait until her fistula is ready. Acute dyspnea multifactorial: Worsening anemia, symptomatic: Has received 1 unit of packed red cells, agree with a second unit.  Needs iron , will order IV.  Also needs ESA both before discharge and on an ongoing manner as an outpatient, will look into difficulties arranging from our office. Acute on chronic HFpEF exacerbation: Pulmonary edema on imaging and ran out of diuretics.  It seems that she was doing relatively well on torsemide  80 mg twice daily.  After receiving blood products we will transition back to outpatient dosing and make sure that she has a prescription. Hypertension: Largely as above, blood pressures acceptable AV fistula not ready for use, for superficialization 6/18 with Dr. Edgardo Goodwill Mild metabolic acidosis: Would not use sodium bicarbonate , continue to monitor  Plan Daily weights, Daily Renal Panel, Strict I/Os, Avoid nephrotoxins (NSAIDs, judicious IV Contrast) As above  Charletta Cons  12/05/2023, 9:43 AM

## 2023-12-06 ENCOUNTER — Telehealth: Payer: Self-pay

## 2023-12-06 ENCOUNTER — Encounter (HOSPITAL_COMMUNITY): Admitting: Physical Therapy

## 2023-12-06 ENCOUNTER — Other Ambulatory Visit: Payer: Self-pay

## 2023-12-06 ENCOUNTER — Other Ambulatory Visit: Payer: Self-pay | Admitting: Nephrology

## 2023-12-06 LAB — TYPE AND SCREEN
ABO/RH(D): A POS
Antibody Screen: NEGATIVE
Unit division: 0

## 2023-12-06 LAB — BPAM RBC
Blood Product Expiration Date: 202507062359
ISSUE DATE / TIME: 202506091235
Unit Type and Rh: 6200

## 2023-12-06 LAB — ERYTHROPOIETIN: Erythropoietin: 29.7 m[IU]/mL — ABNORMAL HIGH (ref 2.6–18.5)

## 2023-12-06 NOTE — Telephone Encounter (Signed)
 Auth Submission: NO AUTH NEEDED Site of care: Site of care: [facility] Payer: [employer]etna/meritain Medication & CPT/J Code(s) submitted: Feraheme (ferumoxytol) [id number] Route of submission (phone, fax, portal): phone Phone # Fax # Auth type: Buy/Bill PB Units/visits requested: 510mg  x 2doses Reference number: [id number] Approval from: 12/06/23 to 06/06/24

## 2023-12-12 ENCOUNTER — Encounter (HOSPITAL_COMMUNITY): Payer: Self-pay | Admitting: Vascular Surgery

## 2023-12-12 ENCOUNTER — Other Ambulatory Visit: Payer: Self-pay

## 2023-12-12 NOTE — Progress Notes (Addendum)
 PCP - Deno Flair, FNP Cardiologist - denies- pt does have a referral appt to see Dr. Mallipeddi on 02/02/24  PPM/ICD - denies   Chest x-ray - 12/03/23 EKG - 09/26/23 Stress Test - denies ECHO - 09/27/23 Cardiac Cath - denies  CPAP - denies  Fasting Blood Sugar - 130-140 Checks Blood Sugar once/day  Blood Thinner Instructions: Hold Eliquis  3 days. Last dose should be 6/14, but pt was instructed 6/15 per letter, so last dose was 6/15. Dany Dyke, RN, is aware).  Aspirin  Instructions: n/a  ERAS Protcol - NPO  COVID TEST- n/a  Anesthesia review: yes, eliquis  last dose 6/15. Pt has upcoming cardiology appt 8/7. Recent ARI. Pt states that she is continuously SOB, even with 2L O2. Pt has PCP appt with Deno Flair, FNP, on 12/13/23.  Patient verbally denies any shortness of breath, fever, cough and chest pain during phone call     Questions were answered. Patient verbalized understanding of instructions.

## 2023-12-12 NOTE — Pre-Procedure Instructions (Signed)
 -------------  SDW INSTRUCTIONS given:  Your procedure is scheduled on 6/18.  Report to [facility] Main Entrance A at 0630 A.M., and check in at the Admitting office.  Any questions or running late day of surgery: call [phone]    Remember:  Do not eat or drink after midnight the night before your surgery    Take these medicines the morning of surgery with A SIP OF WATER  norvasc , lipitor, celexa , flonase, hydralazine , metoprolol , protonix                 May take these medicines IF NEEDED: Tylenol , claritin    Per letter, hold Eliquis  3 days. Letter states last dose 6/15.  As of today, STOP taking any Aspirin  (unless otherwise instructed by your surgeon) Aleve, Naproxen, Ibuprofen, Motrin, Advil, Goody's, BC's, all herbal medications, fish oil, and all vitamins.  WHAT DO I DO ABOUT MY DIABETES MEDICATION?   Do not take TRADJENTA  the morning of surgery.    HOW TO MANAGE YOUR DIABETES BEFORE AND AFTER SURGERY  Why is it important to control my blood sugar before and after surgery? Improving blood sugar levels before and after surgery helps healing and can limit problems. A way of improving blood sugar control is eating a healthy diet by:  Eating less sugar and carbohydrates  Increasing activity/exercise  Talking with your doctor about reaching your blood sugar goals High blood sugars (greater than 180 mg/dL) can raise your risk of infections and slow your recovery, so you will need to focus on controlling your diabetes during the weeks before surgery. Make sure that the doctor who takes care of your diabetes knows about your planned surgery including the date and location.  How do I manage my blood sugar before surgery? Check your blood sugar at least 4 times a day, starting 2 days before surgery, to make sure that the level is not too high or low.  Check your blood sugar the morning of your surgery when you wake up and every 2 hours until you get to the Short Stay  unit.  If your blood sugar is less than 70 mg/dL, you will need to treat for low blood sugar: Do not take insulin . Treat a low blood sugar (less than 70 mg/dL) with  cup of clear juice (cranberry or apple), 4 glucose tablets, OR glucose gel. Recheck blood sugar in 15 minutes after treatment (to make sure it is greater than 70 mg/dL). If your blood sugar is not greater than 70 mg/dL on recheck, call [phone] for further instructions. Report your blood sugar to the short stay nurse when you get to Short Stay.  If you are admitted to the hospital after surgery: Your blood sugar will be checked by the staff and you will probably be given insulin  after surgery (instead of oral diabetes medicines) to make sure you have good blood sugar levels. The goal for blood sugar control after surgery is 80-180 mg/dL.   Do NOT Smoke (Tobacco/Vaping) 24 hours prior to your procedure  If you use a CPAP at night, you may bring all equipment for your overnight stay.     You will be asked to remove any contacts, glasses, piercing's, hearing aid's, dentures/partials prior to surgery. Please bring cases for these items if needed.     Patients discharged the day of surgery will not be allowed to drive home, and someone needs to stay with them for 24 hours.  SURGICAL WAITING ROOM VISITATION Patients may have no more than 2  support people in the waiting area - these visitors may rotate.   Pre-op  nurse will coordinate an appropriate time for 1 ADULT support person, who may not rotate, to accompany patient in pre-op .  Children under the age of 59 must have an adult with them who is not the patient and must remain in the main waiting area with an adult.  If the patient needs to stay at the hospital during part of their recovery, the visitor guidelines for inpatient rooms apply.  Please refer to the [facility] website for the visitor guidelines for any additional information.   Special instructions:   [facility]- Preparing For Surgery   Please follow these instructions carefully.   Shower the NIGHT BEFORE SURGERY and the MORNING OF SURGERY with DIAL Soap.   Pat yourself dry with a CLEAN TOWEL.  Wear CLEAN PAJAMAS to bed the night before surgery  Place CLEAN SHEETS on your bed the night of your first shower and DO NOT SLEEP WITH PETS.   Additional instructions for the day of surgery: DO NOT APPLY any lotions, deodorants, cologne, or perfumes.   Do not wear jewelry or makeup Do not wear nail polish, gel polish, artificial nails, or any other type of covering on natural nails (fingers and toes) Do not bring valuables to the hospital. [facility] is not responsible for valuables/personal belongings. Put on clean/comfortable clothes.  Please brush your teeth.  Ask your nurse before applying any prescription medications to the skin.

## 2023-12-13 ENCOUNTER — Encounter (HOSPITAL_COMMUNITY): Admitting: Physical Therapy

## 2023-12-13 NOTE — Progress Notes (Signed)
 Anesthesia Chart Review: Same day workup  48 year old female with pertinent history including CKD stage V not yet on HD s/p right arm fistula placement, anemia of chronic disease, HTN, HLD, A-fib on Eliquis , class III obesity BMI 51, hypoxic respiratory failure on 2 L O2 continuous.  She has had multiple recent admissions for fluid overload and medication noncompliance.  Most recently admitted 6/7 through 12/05/2023 after presenting with worsening dyspnea.  At that time she reported she had run out of her torsemide  80 mg twice daily and had not taken any for the last 2 days.  ED workup significant for volume overload on imaging, significantly elevated BNP.  She was treated for acute on chronic diastolic CHF exacerbation and diuresed with IV Lasix .  Nephrology consulted and recommended resuming torsemide  80 mg twice daily at discharge.  Discussed that she would need to start HD soon and had upcoming fistula superficialization scheduled with vascular surgery.  Labs during admission were otherwise notable for severe anemia with hemoglobin 7.2 at discharge.  She received 2 units PRBC during admission.  Limited echo 09/27/2023 showed EF 70 to 75%, grade 2 DD, RVSP 38.8 mmHg, no significant valvular abnormalities.  Patient reports last dose of Eliquis  12/11/2023.  She will need day of surgery labs and evaluation.  EKG 09/26/2023: Sinus rhythm.  Rate 90. Low voltage, precordial leads. Nonspecific T abnormalities, lateral leads  Limited echo 09/27/2023: 1. Left ventricular ejection fraction, by estimation, is 70 to 75%. The  left ventricle has hyperdynamic function. There is moderate concentric  left ventricular hypertrophy. Left ventricular diastolic parameters are  consistent with Grade II diastolic  dysfunction (pseudonormalization). The average left ventricular global  longitudinal strain is -18.6 %. The global longitudinal strain is normal.   2. Left atrial size was mildly dilated.   3. Trivial mitral  valve regurgitation.   4. The aortic valve is tricuspid. There is mild calcification of the  aortic valve.   5. There is mildly elevated pulmonary artery systolic pressure. The  estimated right ventricular systolic pressure is 38.8 mmHg.   6. The inferior vena cava is normal in size with greater than 50%  respiratory variability, suggesting right atrial pressure of 3 mmHg.   TTE 07/23/2023:  1. Left ventricular ejection fraction, by estimation, is 55 to 60%. Left  ventricular ejection fraction by 3D volume is 57 %. The left ventricle has  normal function. The left ventricle has no regional wall motion  abnormalities. There is severe concentric   left ventricular hypertrophy. Left ventricular diastolic parameters are  consistent with Grade I diastolic dysfunction (impaired relaxation).   2. Right ventricular systolic function is normal. The right ventricular  size is mildly enlarged. There is normal pulmonary artery systolic  pressure.   3. Left atrial size was mild to moderately dilated.   4. The mitral valve is normal in structure. Mild mitral valve  regurgitation. No evidence of mitral stenosis.   5. The aortic valve is normal in structure. Aortic valve regurgitation is  not visualized. No aortic stenosis is present.   6. The inferior vena cava is normal in size with greater than 50%  respiratory variability, suggesting right atrial pressure of 3 mmHg.     Edilia Gordon [facility]/Anesthesiology Phone [phone] 12/13/2023 4:01 PM

## 2023-12-13 NOTE — Anesthesia Preprocedure Evaluation (Signed)
 Anesthesia Evaluation    Airway        Dental   Pulmonary former smoker          Cardiovascular hypertension,      Neuro/Psych    GI/Hepatic   Endo/Other  diabetes    Renal/GU      Musculoskeletal   Abdominal   Peds  Hematology   Anesthesia Other Findings   Reproductive/Obstetrics                              Anesthesia Physical Anesthesia Plan  ASA:   Anesthesia Plan:    Post-op Pain Management:    Induction:   PONV Risk Score and Plan:   Airway Management Planned:   Additional Equipment:   Intra-op Plan:   Post-operative Plan:   Informed Consent:   Plan Discussed with:   Anesthesia Plan Comments: (PAT note by Rudy Costain, PA-C62 year old female with pertinent history including CKD stage V not yet on HD s/p right arm fistula placement, anemia of chronic disease, HTN, HLD, A-fib on Eliquis , class III obesity BMI 51, hypoxic respiratory failure on 2 L O2 continuous.  She has had multiple recent admissions for fluid overload and medication noncompliance.  Most recently admitted 6/7 through 12/05/2023 after presenting with worsening dyspnea.  At that time she reported she had run out of her torsemide  80 mg twice daily and had not taken any for the last 2 days.  ED workup significant for volume overload on imaging, significantly elevated BNP.  She was treated for acute on chronic diastolic CHF exacerbation and diuresed with IV Lasix .  Nephrology consulted and recommended resuming torsemide  80 mg twice daily at discharge.  Discussed that she would need to start HD soon and had upcoming fistula superficialization scheduled with vascular surgery.  Labs during admission were otherwise notable for severe anemia with hemoglobin 7.2 at discharge.  She received 2 units PRBC during admission.  Limited echo 09/27/2023 showed EF 70 to 75%, grade 2 DD, RVSP 38.8 mmHg, no significant valvular  abnormalities.  Patient reports last dose of Eliquis  12/11/2023.  She will need day of surgery labs and evaluation.  EKG 09/26/2023: Sinus rhythm.  Rate 90. Low voltage, precordial leads. Nonspecific T abnormalities, lateral leads  Limited echo 09/27/2023: 1. Left ventricular ejection fraction, by estimation, is 70 to 75%. The  left ventricle has hyperdynamic function. There is moderate concentric  left ventricular hypertrophy. Left ventricular diastolic parameters are  consistent with Grade II diastolic  dysfunction (pseudonormalization). The average left ventricular global  longitudinal strain is -18.6 %. The global longitudinal strain is normal.   2. Left atrial size was mildly dilated.   3. Trivial mitral valve regurgitation.   4. The aortic valve is tricuspid. There is mild calcification of the  aortic valve.   5. There is mildly elevated pulmonary artery systolic pressure. The  estimated right ventricular systolic pressure is 38.8 mmHg.   6. The inferior vena cava is normal in size with greater than 50%  respiratory variability, suggesting right atrial pressure of 3 mmHg.   TTE 07/23/2023:  1. Left ventricular ejection fraction, by estimation, is 55 to 60%. Left  ventricular ejection fraction by 3D volume is 57 %. The left ventricle has  normal function. The left ventricle has no regional wall motion  abnormalities. There is severe concentric   left ventricular hypertrophy. Left ventricular diastolic parameters are  consistent with Grade I diastolic dysfunction (impaired relaxation).  2. Right ventricular systolic function is normal. The right ventricular  size is mildly enlarged. There is normal pulmonary artery systolic  pressure.   3. Left atrial size was mild to moderately dilated.   4. The mitral valve is normal in structure. Mild mitral valve  regurgitation. No evidence of mitral stenosis.   5. The aortic valve is normal in structure. Aortic valve regurgitation is  not  visualized. No aortic stenosis is present.   6. The inferior vena cava is normal in size with greater than 50%  respiratory variability, suggesting right atrial pressure of 3 mmHg.   )         Anesthesia Quick Evaluation

## 2023-12-14 MED ORDER — PROPOFOL 10 MG/ML IV BOLUS
INTRAVENOUS | Status: AC
  Filled 2023-12-14: qty 20

## 2023-12-14 MED ORDER — FENTANYL CITRATE (PF) 250 MCG/5ML IJ SOLN
INTRAMUSCULAR | Status: AC
  Filled 2023-12-14: qty 5

## 2023-12-14 MED ORDER — MIDAZOLAM HCL 2 MG/2ML IJ SOLN
INTRAMUSCULAR | Status: AC
  Filled 2023-12-14: qty 2

## 2023-12-20 ENCOUNTER — Encounter (HOSPITAL_COMMUNITY): Payer: Self-pay | Admitting: Vascular Surgery

## 2023-12-20 ENCOUNTER — Ambulatory Visit (HOSPITAL_COMMUNITY)

## 2023-12-20 ENCOUNTER — Other Ambulatory Visit (HOSPITAL_COMMUNITY): Payer: Self-pay | Admitting: Nephrology

## 2023-12-20 ENCOUNTER — Other Ambulatory Visit: Payer: Self-pay

## 2023-12-20 DIAGNOSIS — R0600 Dyspnea, unspecified: Secondary | ICD-10-CM

## 2023-12-20 NOTE — Progress Notes (Addendum)
 PCP - Silvio Ramp, FNP Cardiologist - none - pt does have a referral appt to see Dr. Mallipeddi on 02/02/24   Chest x-ray - 12/03/23 EKG - 09/26/23 Stress Test - n/a ECHO LTD - 09/27/23 Cardiac Cath - n/a  ICD Pacemaker/Loop - n/a  Sleep Study -  n/a CPAP - none  Diabetes Type 2 Do not take Tradjenta  on the morning of surgery. Does not check blood sugar.  Aspirin  - n/a  Blood Thinner Instructions:  Hold Eliquis  3 days prior procedure.  Last dose was on 12/18/23 per patient on 12/20/23.SABRA  NPO  Patient is on home oxygen at 2L via [place].  Anesthesia review: Yes   STOP now taking any Aspirin  (unless otherwise instructed by your surgeon), Aleve, Naproxen, Ibuprofen, Motrin, Advil, Goody's, BC's, all herbal medications, fish oil, and all vitamins.   Coronavirus Screening Do you have any of the following symptoms:  Cough yes/no: No Fever (>100.85F)  yes/no: No Runny nose yes/no: No Sore throat yes/no: No Difficulty breathing/shortness of breath  Yes  Have you traveled in the last 14 days and where? yes/no: No  Patient verbalized understanding of instructions that were given via phone.

## 2023-12-21 ENCOUNTER — Ambulatory Visit (HOSPITAL_COMMUNITY): Payer: Self-pay | Admitting: Physician Assistant

## 2023-12-21 ENCOUNTER — Other Ambulatory Visit: Payer: Self-pay

## 2023-12-21 ENCOUNTER — Encounter (HOSPITAL_COMMUNITY): Payer: Self-pay | Admitting: Vascular Surgery

## 2023-12-21 ENCOUNTER — Other Ambulatory Visit (HOSPITAL_COMMUNITY): Payer: Self-pay

## 2023-12-21 ENCOUNTER — Encounter (HOSPITAL_COMMUNITY)
Admission: RE | Disposition: A | Discharge status: Home / Self Care | Payer: Self-pay | Source: Home / Self Care | Attending: Vascular Surgery

## 2023-12-21 ENCOUNTER — Ambulatory Visit (HOSPITAL_COMMUNITY)
Admission: RE | Admit: 2023-12-21 | Discharge: 2023-12-21 | Disposition: A | Discharge status: Home / Self Care | Attending: Vascular Surgery | Admitting: Vascular Surgery

## 2023-12-21 DIAGNOSIS — K219 Gastro-esophageal reflux disease without esophagitis: Secondary | ICD-10-CM | POA: Diagnosis not present

## 2023-12-21 DIAGNOSIS — I5033 Acute on chronic diastolic (congestive) heart failure: Secondary | ICD-10-CM | POA: Diagnosis not present

## 2023-12-21 DIAGNOSIS — N186 End stage renal disease: Secondary | ICD-10-CM

## 2023-12-21 DIAGNOSIS — D631 Anemia in chronic kidney disease: Secondary | ICD-10-CM | POA: Diagnosis not present

## 2023-12-21 DIAGNOSIS — E1122 Type 2 diabetes mellitus with diabetic chronic kidney disease: Secondary | ICD-10-CM | POA: Insufficient documentation

## 2023-12-21 DIAGNOSIS — T82590A Other mechanical complication of surgically created arteriovenous fistula, initial encounter: Secondary | ICD-10-CM

## 2023-12-21 DIAGNOSIS — Z9981 Dependence on supplemental oxygen: Secondary | ICD-10-CM

## 2023-12-21 DIAGNOSIS — N185 Chronic kidney disease, stage 5: Secondary | ICD-10-CM | POA: Diagnosis not present

## 2023-12-21 DIAGNOSIS — Z6841 Body Mass Index (BMI) 40.0 and over, adult: Secondary | ICD-10-CM | POA: Insufficient documentation

## 2023-12-21 DIAGNOSIS — Z91148 Patient's other noncompliance with medication regimen for other reason: Secondary | ICD-10-CM | POA: Insufficient documentation

## 2023-12-21 DIAGNOSIS — I480 Paroxysmal atrial fibrillation: Secondary | ICD-10-CM | POA: Diagnosis not present

## 2023-12-21 DIAGNOSIS — J9691 Respiratory failure, unspecified with hypoxia: Secondary | ICD-10-CM | POA: Insufficient documentation

## 2023-12-21 DIAGNOSIS — Z87891 Personal history of nicotine dependence: Secondary | ICD-10-CM | POA: Diagnosis not present

## 2023-12-21 DIAGNOSIS — K449 Diaphragmatic hernia without obstruction or gangrene: Secondary | ICD-10-CM | POA: Diagnosis not present

## 2023-12-21 DIAGNOSIS — E785 Hyperlipidemia, unspecified: Secondary | ICD-10-CM | POA: Diagnosis not present

## 2023-12-21 DIAGNOSIS — I509 Heart failure, unspecified: Secondary | ICD-10-CM | POA: Diagnosis not present

## 2023-12-21 DIAGNOSIS — E66813 Obesity, class 3: Secondary | ICD-10-CM | POA: Diagnosis not present

## 2023-12-21 DIAGNOSIS — I132 Hypertensive heart and chronic kidney disease with heart failure and with stage 5 chronic kidney disease, or end stage renal disease: Secondary | ICD-10-CM | POA: Diagnosis not present

## 2023-12-21 DIAGNOSIS — J9601 Acute respiratory failure with hypoxia: Secondary | ICD-10-CM

## 2023-12-21 DIAGNOSIS — Z79899 Other long term (current) drug therapy: Secondary | ICD-10-CM | POA: Diagnosis not present

## 2023-12-21 DIAGNOSIS — J449 Chronic obstructive pulmonary disease, unspecified: Secondary | ICD-10-CM | POA: Insufficient documentation

## 2023-12-21 DIAGNOSIS — Z7901 Long term (current) use of anticoagulants: Secondary | ICD-10-CM | POA: Insufficient documentation

## 2023-12-21 HISTORY — DX: Dyspnea, unspecified: R06.00

## 2023-12-21 HISTORY — PX: FISTULA SUPERFICIALIZATION: SHX6341

## 2023-12-21 LAB — POCT I-STAT, CHEM 8
BUN: 62 mg/dL — ABNORMAL HIGH (ref 6–20)
Calcium, Ion: 1.09 mmol/L — ABNORMAL LOW (ref 1.15–1.40)
Chloride: 104 mmol/L (ref 98–111)
Creatinine, Ser: 6.7 mg/dL — ABNORMAL HIGH (ref 0.44–1.00)
Glucose, Bld: 100 mg/dL — ABNORMAL HIGH (ref 70–99)
HCT: 24 % — ABNORMAL LOW (ref 36.0–46.0)
Hemoglobin: 8.2 g/dL — ABNORMAL LOW (ref 12.0–15.0)
Potassium: 3.9 mmol/L (ref 3.5–5.1)
Sodium: 139 mmol/L (ref 135–145)
TCO2: 22 mmol/L (ref 22–32)

## 2023-12-21 LAB — GLUCOSE, CAPILLARY
Glucose-Capillary: 99 mg/dL (ref 70–99)
Glucose-Capillary: 116 mg/dL — ABNORMAL HIGH (ref 70–99)
Glucose-Capillary: 136 mg/dL — ABNORMAL HIGH (ref 70–99)

## 2023-12-21 LAB — POCT PREGNANCY, URINE: Preg Test, Ur: NEGATIVE

## 2023-12-21 SURGERY — FISTULA SUPERFICIALIZATION
Anesthesia: Monitor Anesthesia Care | Site: Arm Upper | Laterality: Right

## 2023-12-21 MED ORDER — PROPOFOL 500 MG/50ML IV EMUL
INTRAVENOUS | Status: DC | PRN
  Administered 2023-12-21: 30 ug/kg/min via INTRAVENOUS

## 2023-12-21 MED ORDER — HEPARIN SODIUM (PORCINE) 1000 UNIT/ML IJ SOLN
INTRAMUSCULAR | Status: AC
  Filled 2023-12-21: qty 20

## 2023-12-21 MED ORDER — DEXAMETHASONE SODIUM PHOSPHATE 10 MG/ML IJ SOLN
INTRAMUSCULAR | Status: AC
  Filled 2023-12-21: qty 1

## 2023-12-21 MED ORDER — CHLORHEXIDINE GLUCONATE 4 % EX SOLN: 60.0000 mL | Freq: Once | CUTANEOUS | Status: DC

## 2023-12-21 MED ORDER — CHLORHEXIDINE GLUCONATE 0.12 % MT SOLN: 15.0000 mL | Freq: Once | OROMUCOSAL | Status: AC

## 2023-12-21 MED ORDER — ONDANSETRON HCL 4 MG/2ML IJ SOLN
INTRAMUSCULAR | Status: DC | PRN
  Administered 2023-12-21 (×2): 4 mg via INTRAVENOUS

## 2023-12-21 MED ORDER — FENTANYL CITRATE (PF) 250 MCG/5ML IJ SOLN
INTRAMUSCULAR | Status: AC
  Filled 2023-12-21: qty 5

## 2023-12-21 MED ORDER — PHENYLEPHRINE 80 MCG/ML (10ML) SYRINGE FOR IV PUSH (FOR BLOOD PRESSURE SUPPORT)
PREFILLED_SYRINGE | INTRAVENOUS | Status: AC
  Filled 2023-12-21: qty 10

## 2023-12-21 MED ORDER — 0.9 % SODIUM CHLORIDE (POUR BTL) OPTIME
TOPICAL | Status: DC | PRN
  Administered 2023-12-21: 1000 mL

## 2023-12-21 MED ORDER — LIDOCAINE-EPINEPHRINE (PF) 1 %-1:200000 IJ SOLN
INTRAMUSCULAR | Status: AC
Start: 2023-12-21 — End: 2023-12-21
  Filled 2023-12-21: qty 30

## 2023-12-21 MED ORDER — INSULIN ASPART 100 UNIT/ML IJ SOLN: 0.0000 [IU] | INTRAMUSCULAR | Status: DC | PRN

## 2023-12-21 MED ORDER — FENTANYL CITRATE (PF) 100 MCG/2ML IJ SOLN: 50.0000 ug | Freq: Once | INTRAMUSCULAR | Status: AC

## 2023-12-21 MED ORDER — ONDANSETRON HCL 4 MG/2ML IJ SOLN
INTRAMUSCULAR | Status: AC
  Filled 2023-12-21: qty 4

## 2023-12-21 MED ORDER — FENTANYL CITRATE (PF) 100 MCG/2ML IJ SOLN
INTRAMUSCULAR | Status: AC
  Administered 2023-12-21: 50 ug via INTRAVENOUS
  Filled 2023-12-21: qty 2

## 2023-12-21 MED ORDER — ACETAMINOPHEN 10 MG/ML IV SOLN: 1000.0000 mg | Freq: Once | INTRAVENOUS | Status: DC | PRN

## 2023-12-21 MED ORDER — ORAL CARE MOUTH RINSE
15.0000 mL | Freq: Once | OROMUCOSAL | Status: AC
Start: 2023-12-21 — End: 2023-12-21

## 2023-12-21 MED ORDER — SODIUM CHLORIDE 0.9% FLUSH: 3.0000 mL | INTRAVENOUS | Status: DC | PRN

## 2023-12-21 MED ORDER — PHENYLEPHRINE HCL-NACL 20-0.9 MG/250ML-% IV SOLN
INTRAVENOUS | Status: DC | PRN
  Administered 2023-12-21: 50 ug/min via INTRAVENOUS

## 2023-12-21 MED ORDER — MEPIVACAINE HCL (PF) 2 % IJ SOLN
INTRAMUSCULAR | Status: DC | PRN
  Administered 2023-12-21: 20 mL

## 2023-12-21 MED ORDER — HEPARIN 6000 UNIT IRRIGATION SOLUTION
Status: DC | PRN
  Administered 2023-12-21: 1

## 2023-12-21 MED ORDER — SODIUM CHLORIDE 0.9 % IV SOLN: INTRAVENOUS | Status: DC

## 2023-12-21 MED ORDER — MIDAZOLAM HCL 2 MG/2ML IJ SOLN
INTRAMUSCULAR | Status: AC
  Administered 2023-12-21: 1 mg via INTRAVENOUS
  Filled 2023-12-21: qty 2

## 2023-12-21 MED ORDER — CEFAZOLIN SODIUM-DEXTROSE 3-4 GM/150ML-% IV SOLN
INTRAVENOUS | Status: AC
  Filled 2023-12-21: qty 150

## 2023-12-21 MED ORDER — LIDOCAINE-EPINEPHRINE (PF) 1 %-1:200000 IJ SOLN: INTRAMUSCULAR | Status: DC | PRN

## 2023-12-21 MED ORDER — HYDROCODONE-ACETAMINOPHEN 5-325 MG PO TABS
1.0000 | ORAL_TABLET | Freq: Four times a day (QID) | ORAL | 0 refills | Status: DC | PRN
  Filled 2023-12-21: qty 15, 4d supply, fill #0

## 2023-12-21 MED ORDER — PROPOFOL 10 MG/ML IV BOLUS
INTRAVENOUS | Status: DC | PRN
  Administered 2023-12-21 (×2): 10 mg via INTRAVENOUS

## 2023-12-21 MED ORDER — MIDAZOLAM HCL 2 MG/2ML IJ SOLN: 1.0000 mg | Freq: Once | INTRAMUSCULAR | Status: AC

## 2023-12-21 MED ORDER — CHLORHEXIDINE GLUCONATE 0.12 % MT SOLN
OROMUCOSAL | Status: AC
  Administered 2023-12-21: 15 mL via OROMUCOSAL
  Filled 2023-12-21: qty 15

## 2023-12-21 MED ORDER — FENTANYL CITRATE (PF) 100 MCG/2ML IJ SOLN: 25.0000 ug | INTRAMUSCULAR | Status: DC | PRN

## 2023-12-21 MED ORDER — CEFAZOLIN SODIUM-DEXTROSE 3-4 GM/150ML-% IV SOLN
3.0000 g | INTRAVENOUS | Status: AC
  Administered 2023-12-21: 3 g via INTRAVENOUS

## 2023-12-21 MED ORDER — ROCURONIUM BROMIDE 10 MG/ML (PF) SYRINGE
PREFILLED_SYRINGE | INTRAVENOUS | Status: AC
  Filled 2023-12-21: qty 20

## 2023-12-21 MED ORDER — HEPARIN 6000 UNIT IRRIGATION SOLUTION
Status: AC
  Filled 2023-12-21: qty 500

## 2023-12-21 SURGICAL SUPPLY — 53 items
ARMBAND PINK RESTRICT EXTREMIT (MISCELLANEOUS) ×2 IMPLANT
BAG COUNTER SPONGE SURGICOUNT (BAG) ×2 IMPLANT
BAG DECANTER FOR FLEXI CONT (MISCELLANEOUS) ×2 IMPLANT
BENZOIN TINCTURE PRP APPL 2/3 (GAUZE/BANDAGES/DRESSINGS) ×2 IMPLANT
BIOPATCH RED 1 DISK 7.0 (GAUZE/BANDAGES/DRESSINGS) ×2 IMPLANT
CANISTER SUCTION 3000ML PPV (SUCTIONS) ×2 IMPLANT
CANNULA VESSEL 3MM 2 BLNT TIP (CANNULA) ×2 IMPLANT
CATH PALINDROME-P 19CM W/VT (CATHETERS) IMPLANT
CATH PALINDROME-P 28CM W/VT (CATHETERS) IMPLANT
CHLORAPREP W/TINT 26 (MISCELLANEOUS) ×2 IMPLANT
CLIP LIGATING EXTRA MED SLVR (CLIP) ×1 IMPLANT
CLIP LIGATING EXTRA SM BLUE (MISCELLANEOUS) ×1 IMPLANT
COVER PROBE W GEL 5X96 (DRAPES) ×2 IMPLANT
COVER SURGICAL LIGHT HANDLE (MISCELLANEOUS) ×2 IMPLANT
DRAPE C-ARM 42X72 X-RAY (DRAPES) ×2 IMPLANT
DRAPE CHEST BREAST 15X10 FENES (DRAPES) ×2 IMPLANT
ELECTRODE REM PT RTRN 9FT ADLT (ELECTROSURGICAL) ×2 IMPLANT
GAUZE 4X4 16PLY ~~LOC~~+RFID DBL (SPONGE) ×2 IMPLANT
GAUZE SPONGE 4X4 12PLY STRL (GAUZE/BANDAGES/DRESSINGS) ×2 IMPLANT
GLOVE BIO SURGEON STRL SZ8 (GLOVE) ×2 IMPLANT
GOWN STRL REUS W/ TWL LRG LVL3 (GOWN DISPOSABLE) ×4 IMPLANT
GOWN STRL REUS W/ TWL XL LVL3 (GOWN DISPOSABLE) ×2 IMPLANT
KIT BASIN OR (CUSTOM PROCEDURE TRAY) ×2 IMPLANT
KIT PALINDROME-P 55CM (CATHETERS) IMPLANT
KIT TURNOVER KIT B (KITS) ×2 IMPLANT
LOOP VESSEL MINI RED (MISCELLANEOUS) ×1 IMPLANT
NDL 18GX1X1/2 (RX/OR ONLY) (NEEDLE) ×1 IMPLANT
NDL HYPO 25GX1X1/2 BEV (NEEDLE) ×1 IMPLANT
NEEDLE 18GX1X1/2 (RX/OR ONLY) (NEEDLE) ×2 IMPLANT
NEEDLE HYPO 25GX1X1/2 BEV (NEEDLE) ×2 IMPLANT
NS IRRIG 1000ML POUR BTL (IV SOLUTION) ×2 IMPLANT
PACK CV ACCESS (CUSTOM PROCEDURE TRAY) ×2 IMPLANT
PACK SRG BSC III STRL LF ECLPS (CUSTOM PROCEDURE TRAY) ×2 IMPLANT
PAD ARMBOARD POSITIONER FOAM (MISCELLANEOUS) ×4 IMPLANT
SET MICROPUNCTURE 5F STIFF (MISCELLANEOUS) ×2 IMPLANT
SLING ARM FOAM STRAP LRG (SOFTGOODS) IMPLANT
SOAP 2 % CHG 4 OZ (WOUND CARE) ×2 IMPLANT
STRIP CLOSURE SKIN 1/2X4 (GAUZE/BANDAGES/DRESSINGS) ×3 IMPLANT
SUT ETHILON 3 0 PS 1 (SUTURE) ×2 IMPLANT
SUT MNCRL AB 4-0 PS2 18 (SUTURE) ×3 IMPLANT
SUT PROLENE 5 0 C 1 24 (SUTURE) ×2 IMPLANT
SUT PROLENE 6 0 BV (SUTURE) ×2 IMPLANT
SUT SILK 3 0SH CR/8 30 (SUTURE) ×1 IMPLANT
SUT VIC AB 3-0 SH 27X BRD (SUTURE) ×3 IMPLANT
SYR 10ML LL (SYRINGE) ×2 IMPLANT
SYR 20ML LL LF (SYRINGE) ×4 IMPLANT
SYR 5ML LL (SYRINGE) ×2 IMPLANT
SYR CONTROL 10ML LL (SYRINGE) ×2 IMPLANT
TAPE CLOTH SURG 4X10 WHT LF (GAUZE/BANDAGES/DRESSINGS) ×1 IMPLANT
TOWEL GREEN STERILE (TOWEL DISPOSABLE) ×2 IMPLANT
TOWEL GREEN STERILE FF (TOWEL DISPOSABLE) ×4 IMPLANT
UNDERPAD 30X36 HEAVY ABSORB (UNDERPADS AND DIAPERS) ×2 IMPLANT
WATER STERILE IRR 1000ML POUR (IV SOLUTION) ×2 IMPLANT

## 2023-12-21 NOTE — Anesthesia Procedure Notes (Signed)
 Procedure Name: MAC Date/Time: 12/21/2023 10:20 AM  Performed by: Boyce Shilling, CRNAPre-anesthesia Checklist: Patient identified, Emergency Drugs available, Suction available, Timeout performed and Patient being monitored Patient Re-evaluated:Patient Re-evaluated prior to induction Oxygen Delivery Method: Supernova nasal CPAP Induction Type: IV induction Dental Injury: Teeth and Oropharynx as per pre-operative assessment

## 2023-12-21 NOTE — Op Note (Signed)
 DATE OF SERVICE: 12/21/2023  PATIENT:  Melissa [name]  48 y.o. female  PRE-OPERATIVE DIAGNOSIS: CKD 5  POST-OPERATIVE DIAGNOSIS:    PROCEDURE:   Second stage transposition of right brachiocephalic arteriovenous fistula (CPT [phone])  SURGEON:  Surgeons and Role:    * Magda Debby SAILOR, MD - Primary  ASSISTANT: Luke Stacks, RNFA  ANESTHESIA:   regional and MAC  EBL: minimal  BLOOD ADMINISTERED:none  DRAINS: none   LOCAL MEDICATIONS USED:  NONE  SPECIMEN:  none  COUNTS: confirmed correct.  TOURNIQUET:  none  PATIENT DISPOSITION:  PACU - hemodynamically stable.   Delay start of Pharmacological VTE agent (>24hrs) due to surgical blood loss or risk of bleeding: no  INDICATION FOR PROCEDURE: Melissa [name] is a 48 y.o. female with CKD 5.  I previously created a right arm brachiocephalic arteriovenous fistula for her, which is too deep to cannulate.  The patient has developed hypoxemic respiratory failure over the past several weeks and is now on home oxygen.  I had a discussion with the patient's nephrologist prior to procedure and inquired if a tunneled dialysis catheter would be helpful.  He informed me that he would like to forego catheter placement at this time.  After careful discussion of risks, benefits, and alternatives the patient was offered transposition. The patient understood and wished to proceed.  OPERATIVE FINDINGS: Healthy fistula from anastomosis to shoulder.  3 skip incisions made to expose the fistula.  New tunnel created.  Fistula reanastomosed.  Good technical result achieved.  DESCRIPTION OF PROCEDURE: After identification of the patient in the pre-operative holding area, the patient was transferred to the operating room. The patient was positioned supine in an upright position to accommodate her dyspnea on the operating room table. Anesthesia was induced. The right arm was prepped and draped in standard fashion. A surgical pause was performed confirming correct  patient, procedure, and operative location.  Intraoperative ultrasound was used to map the course of the brachiocephalic fistula in the upper arm.  Give incisions were planned on the arm with a indelible marker.  The incisions were made with a 10 blade.  The fistula was exposed from near the anastomosis to the shoulder using careful exposure with electrocautery and Metzenbaum scissor dissection.  Several branches were identified and suture-ligated.  The patient had significant venous hypertension in the arm with multiple engorged superficial veins.  After skeletonizing the fistula, a curved Kelly-Wick tunneler was used to create a new tunnel in the subcutaneous plane just lateral to the skip incisions to deliver the fistula into a usable position.  Clamps were applied of the fistula proximally and distally.  The fistula was divided.  The fistula was marked to ensure we tunneled the fistula without twisting or kinking the fistula.  The fistula was delivered through the tunnel with a uterine dressing clamp.  The fistula was then reanastomosed to itself using continuous running suture of 5-0 Prolene with end-to-end anastomosis.  After completion the anastomosis was flushed and de-aired.  Clamps were released.  The anastomosis was found to be hemostatic.  The fistula was evaluated Doppler.  Brisk Doppler bruit was heard.  Satisfied during the case here.  The wounds were closed in layers using 3-0 Vicryl and 4-0 Monocryl.  Benzoin and Steri-Strips were applied.  The patient tolerated this well.  Upon completion of the case instrument and sharps counts were confirmed correct. The patient was transferred to the PACU in good condition. I was present for all portions of the  procedure.  FOLLOW UP PLAN: Assuming a normal postoperative course, VVS PA will see the patient in 6 weeks with AV fistula duplex.   [name] Magda, MD [facility] Phone Number: [phone] 12/21/2023 3:14 PM

## 2023-12-21 NOTE — Progress Notes (Signed)
 Discharge instructions reviewed with patient and friend who is transporting and staying with patient. All questions answered. IV d/c'd without difficulty. When standing to chair, patient complained of shortness of breath. Oxygen saturations noted to be in low 80s. Dr. Gregory Stoltzfus, Anesthesia, notified. Stated as long as patient recovered quickly, patient was okay to go. This RN reiterated return instructions to patient. Off unit with tech via wheelchair to discharge pharmacy.

## 2023-12-21 NOTE — Discharge Instructions (Signed)

## 2023-12-21 NOTE — Transfer of Care (Signed)
 Immediate Anesthesia Transfer of Care Note  Patient: Melissa [name]  Procedure(s) Performed: SECOND STAGE  BRACHIOCEPHALIC FISTULA SUPERFICIALIZATION (Right: Arm Upper)  Patient Location: PACU  Anesthesia Type:MAC and Regional  Level of Consciousness: awake, alert , and oriented  Airway & Oxygen Therapy: Patient connected to face mask oxygen  Post-op Assessment: Report given to RN and Post -op Vital signs reviewed and stable  Post vital signs: Reviewed and stable  Last Vitals:  Vitals Value Taken Time  BP 121/74 12/21/23 12:25  Temp 36.5 C 12/21/23 12:25  Pulse 73 12/21/23 12:29  Resp 18 12/21/23 12:29  SpO2 96 % 12/21/23 12:29  Vitals shown include unfiled device data.  Last Pain:  Vitals:   12/21/23 1225  TempSrc:   PainSc: 0-No pain         Complications: No notable events documented.

## 2023-12-21 NOTE — H&P (Signed)
 See below for H&P details. Plan fistula revision and TDC placement in OR today.  [name] Magda, MD [facility] Phone Number: [phone] 12/21/2023 9:48 AM   [facility] [place]  ASSESSMENT / PLAN: Melissa [name] is a 48 y.o. CKD 4 status post right brachiocephalic AV fistula creation.  The fistula has matured nicely since creation.  Unfortunately the fistula is too deep to cannulate.  She will need superficialization.  Will plan to do this as the OR schedule allows.  CHIEF COMPLAINT: Worsening renal function  HISTORY OF PRESENT ILLNESS: Melissa [name] is a 48 y.o. female referred to clinic for discussion of permanent dialysis access.  The patient has had deteriorating renal function for some time.  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  She is right-handed.  She has never had dialysis access before.  She has never had a pacemaker.  11/17/2023.  Patient returns to clinic for follow-up after a brachiocephalic fistula creation on the right side.  She is doing well.  She has no hand related complaints.  She has a palpable radial pulse.  She is healed nicely.  We reviewed her duplex which shows the fistula is too deep to cannulate.  I reviewed the rationale for fistula superficialization.  She is understanding and willing to proceed.  Past Medical History:  Diagnosis Date   Anemia    hx iron  infusions   Anxiety    CHF (congestive heart failure) (HCC) 09/26/2023   chronic diastolic - ER Visit   Chronic kidney disease    Complication of anesthesia    Diabetes mellitus without complication (HCC)    type 2, does not check blood sugar   Dyspnea    Dysrhythmia 01/2019   PAF   GERD (gastroesophageal reflux disease)    History of hiatal hernia    Hypertension    PONV (postoperative nausea and vomiting)     Past Surgical History:  Procedure Laterality Date   AV FISTULA PLACEMENT Right 09/21/2023   Procedure: Right  Brachiocephalic ARTERIOVENOUS (AV) FISTULA CREATION;  Surgeon: Magda Debby SAILOR, MD;  Location: [facility] OR;  Service: Vascular;  Laterality: Right;   BIOPSY  02/02/2022   Procedure: BIOPSY;  Surgeon: Eartha Angelia Sieving, MD;  Location: [facility];  Service: Gastroenterology;;   ESOPHAGOGASTRODUODENOSCOPY (EGD) WITH PROPOFOL  N/A 02/02/2022   Procedure: ESOPHAGOGASTRODUODENOSCOPY (EGD) WITH PROPOFOL ;  Surgeon: Eartha Angelia Sieving, MD;  Location: [facility];  Service: Gastroenterology;  Laterality: N/A;  240    History reviewed. No pertinent family history.  Social History   Socioeconomic History   Marital status: Married    Spouse name: Not on file   Number of children: Not on file   Years of education: Not on file   Highest education level: Not on file  Occupational History   Not on file  Tobacco Use   Smoking status: Former    Current packs/day: 0.00    Types: Cigarettes    Quit date: 26    Years since quitting: 32.5    Passive exposure: Past   Smokeless tobacco: Never  Vaping Use   Vaping status: Never Used  Substance and Sexual Activity   Alcohol use: Not Currently   Drug use: No   Sexual activity: Not Currently    Birth control/protection: None  Other Topics Concern   Not on file  Social History Narrative   Not on file   Social Drivers of Health  Financial Resource Strain: Low Risk  (11/10/2021)   Received from [facility]   Overall Financial Resource Strain (CARDIA)    Difficulty of Paying Living Expenses: Not very hard  Food Insecurity: No Food Insecurity (12/03/2023)   Hunger Vital Sign    Worried About Running Out of Food in the Last Year: Never true    Ran Out of Food in the Last Year: Never true  Transportation Needs: No Transportation Needs (12/03/2023)   PRAPARE - Administrator, Civil Service (Medical): No    Lack of Transportation (Non-Medical): No  Physical Activity: Not on file  Stress: Not on file  Social Connections:  Not on file  Intimate Partner Violence: Not At Risk (12/03/2023)   Humiliation, Afraid, Rape, and Kick questionnaire    Fear of Current or Ex-Partner: No    Emotionally Abused: No    Physically Abused: No    Sexually Abused: No    Allergies  Allergen Reactions   Ozempic (0.25 Or 0.5 Mg-Dose) [Semaglutide(0.25 Or 0.5mg -Dos)] Nausea And Vomiting    Current Facility-Administered Medications  Medication Dose Route Frequency Provider Last Rate Last Admin   0.9 %  sodium chloride  infusion   Intravenous Continuous Stoltzfus, Gregory P, DO 10 mL/hr at 12/21/23 0851 Continued from Pre-op  at 12/21/23 0851   ceFAZolin  (ANCEF ) 3-4 GM/150ML-% IVPB            ceFAZolin  (ANCEF ) IVPB 3g/150 mL premix  3 g Intravenous 30 min Pre-Op  Magda Debby SAILOR, MD       chlorhexidine  (HIBICLENS ) 4 % liquid 4 Application  60 mL Topical Once Julann Mcgilvray N, MD       And   chlorhexidine  (HIBICLENS ) 4 % liquid 4 Application  60 mL Topical Once [place] Pizzimenti N, MD       fentaNYL  (SUBLIMAZE ) 100 MCG/2ML injection            insulin  aspart (novoLOG ) injection 0-7 Units  0-7 Units Subcutaneous Q2H PRN Stoltzfus, Gregory P, DO       midazolam  (VERSED ) 2 MG/2ML injection            sodium chloride  flush (NS) 0.9 % injection 3-10 mL  3-10 mL Intravenous PRN Magda Debby SAILOR, MD        PHYSICAL EXAM Vitals:   12/12/23 1350 12/20/23 1320 12/21/23 0743  BP:   (!) 151/71  Pulse:   82  Resp:   20  Temp:   97.9 F (36.6 C)  TempSrc:   Oral  SpO2:   94%  Weight: (!) 139.2 kg (!) 140.6 kg (!) 136.5 kg  Height: 5' 5 (1.651 m) 5' 5 (1.651 m) 5' 5 (1.651 m)    Middle-age woman in no distress Regular rate and rhythm Unlabored breathing 2+ radial pulses bilaterally Right brachiocephalic incision is healed Strong thrill in the fistula palpable near the incision.  The fistula is now palpable above this.  PERTINENT LABORATORY AND RADIOLOGIC DATA  Most recent CBC    Latest Ref Rng & Units 12/21/2023    8:13 AM  12/05/2023    4:50 AM 12/04/2023    7:32 PM  CBC  WBC 4.0 - 10.5 K/uL  6.0    Hemoglobin 12.0 - 15.0 g/dL 8.2  7.2  7.5   Hematocrit 36.0 - 46.0 % 24.0  24.1  24.6   Platelets 150 - 400 K/uL  240       Most recent CMP    Latest Ref Rng & Units 12/21/2023  8:13 AM 12/05/2023    4:50 AM 12/04/2023    2:34 AM  CMP  Glucose 70 - 99 mg/dL 899  89  91   BUN 6 - 20 mg/dL 62  65  65   Creatinine 0.44 - 1.00 mg/dL 3.29  2.84  2.99   Sodium 135 - 145 mmol/L 139  137  139   Potassium 3.5 - 5.1 mmol/L 3.9  4.7  4.7   Chloride 98 - 111 mmol/L 104  106  111   CO2 22 - 32 mmol/L  19  20   Calcium  8.9 - 10.3 mg/dL  8.4  8.5     Renal function Estimated Creatinine Clearance: 14.6 mL/min (A) (by C-G formula based on SCr of 6.7 mg/dL (H)).  Hgb A1c MFr Bld (%)  Date Value  04/17/2023 8.5 (H)   Fistula duplex shows a strong fistula with 2.1 L of flow per minute.  There is some branches off of the fistula visualized.  The fistula is far too deep to cannulate.   [name] Magda, MD FACS [facility] Phone Number: [phone] 12/21/2023 9:48 AM   Total time spent on preparing this encounter including chart review, data review, collecting history, examining the patient, coordinating care for this established patient, 20 minutes  Portions of this report may have been transcribed using voice recognition software.  Every effort has been made to ensure accuracy; however, inadvertent computerized transcription errors may still be present.

## 2023-12-21 NOTE — Anesthesia Procedure Notes (Signed)
 Anesthesia Regional Block: Supraclavicular block   Pre-Anesthetic Checklist: , timeout performed,  Correct Patient, Correct Site, Correct Laterality,  Correct Procedure, Correct Position, site marked,  Risks and benefits discussed,  Surgical consent,  Pre-op  evaluation,  At surgeon's request and post-op pain management  Laterality: Right  Prep: Dura Prep       Needles:  Injection technique: Single-shot  Needle Type: Echogenic Stimulator Needle     Needle Length: 5cm  Needle Gauge: 20     Additional Needles:   Procedures:,,,, ultrasound used (permanent image in chart),,    Narrative:  Start time: 12/21/2023 9:57 AM End time: 12/21/2023 10:02 AM Injection made incrementally with aspirations every 5 mL.  Performed by: Personally  Anesthesiologist: Dorethea Cordella SQUIBB, DO  Additional Notes: Patient identified. Risks/Benefits/Options discussed with patient including but not limited to bleeding, infection, nerve damage, failed block, incomplete pain control. Patient expressed understanding and wished to proceed. All questions were answered. Sterile technique was used throughout the entire procedure. Please see nursing notes for vital signs. Aspirated in 5cc intervals with injection for negative confirmation. Patient was given instructions on fall risk and not to get out of bed. All questions and concerns addressed with instructions to call with any issues or inadequate analgesia.

## 2023-12-22 ENCOUNTER — Encounter: Payer: Self-pay | Admitting: Nephrology

## 2023-12-22 ENCOUNTER — Encounter (HOSPITAL_COMMUNITY): Payer: Self-pay | Admitting: Vascular Surgery

## 2023-12-22 NOTE — Anesthesia Postprocedure Evaluation (Signed)
 Anesthesia Post Note  Patient: Melissa [name]  Procedure(s) Performed: SECOND STAGE  BRACHIOCEPHALIC FISTULA SUPERFICIALIZATION (Right: Arm Upper)     Patient location during evaluation: PACU Anesthesia Type: Regional and MAC Level of consciousness: awake and alert Pain management: pain level controlled Vital Signs Assessment: post-procedure vital signs reviewed and stable Respiratory status: spontaneous breathing, nonlabored ventilation, respiratory function stable and patient connected to nasal cannula oxygen Cardiovascular status: stable and blood pressure returned to baseline Postop Assessment: no apparent nausea or vomiting Anesthetic complications: no   No notable events documented.  Last Vitals:  Vitals:   12/21/23 1515 12/21/23 1538  BP: 138/80 (!) 143/84  Pulse: 70 71  Resp: 12 17  Temp:  36.7 C  SpO2: (!) 89% (!) 89%    Last Pain:  Vitals:   12/21/23 1530  TempSrc:   PainSc: 0-No pain                 Cordella P Lavora Brisbon

## 2023-12-28 ENCOUNTER — Telehealth: Payer: Self-pay | Admitting: Pharmacy Technician

## 2023-12-28 NOTE — Telephone Encounter (Addendum)
 Auth Submission: PENDING [employer] Site of care: Site of care: [facility] Auth submitted for Feraheme and Retacrit Payer: [employer] Medication & CPT/J Code(s) submitted: Feraheme (ferumoxytol) [id number] And Retacrit Diagnosis Code:  Route of submission (phone, fax, portal):  Phone #[phone] Fax #[phone] Auth type: Buy/Bill HB Units/visits requested:  Reference number: [id number] Approval from: 12/28/23 to 06/27/24

## 2024-01-02 ENCOUNTER — Other Ambulatory Visit (HOSPITAL_COMMUNITY): Payer: Self-pay | Admitting: *Deleted

## 2024-01-03 ENCOUNTER — Encounter: Payer: Self-pay | Admitting: Nephrology

## 2024-01-03 ENCOUNTER — Encounter (HOSPITAL_COMMUNITY): Payer: Self-pay

## 2024-01-04 ENCOUNTER — Inpatient Hospital Stay (HOSPITAL_COMMUNITY)
Admission: RE | Admit: 2024-01-04 | Discharge: 2024-01-04 | Disposition: A | Discharge status: Home / Self Care | Source: Ambulatory Visit | Attending: Nephrology | Admitting: Nephrology

## 2024-01-04 ENCOUNTER — Encounter (HOSPITAL_COMMUNITY): Payer: Self-pay

## 2024-01-04 ENCOUNTER — Telehealth: Payer: Self-pay

## 2024-01-04 NOTE — Telephone Encounter (Signed)
 ATC x2 for precharting

## 2024-01-04 NOTE — Telephone Encounter (Signed)
 ATC for precharting LMTCB

## 2024-01-05 ENCOUNTER — Ambulatory Visit

## 2024-01-05 ENCOUNTER — Telehealth: Payer: Self-pay

## 2024-01-05 VITALS — BP 115/70 | HR 83 | Ht 65.0 in | Wt 305.0 lb

## 2024-01-05 DIAGNOSIS — N185 Chronic kidney disease, stage 5: Secondary | ICD-10-CM

## 2024-01-05 DIAGNOSIS — G4719 Other hypersomnia: Secondary | ICD-10-CM | POA: Diagnosis not present

## 2024-01-05 DIAGNOSIS — D631 Anemia in chronic kidney disease: Secondary | ICD-10-CM

## 2024-01-05 DIAGNOSIS — R0609 Other forms of dyspnea: Secondary | ICD-10-CM

## 2024-01-05 DIAGNOSIS — R0683 Snoring: Secondary | ICD-10-CM | POA: Diagnosis not present

## 2024-01-05 NOTE — Progress Notes (Signed)
 @Patient  ID: Melissa [name], female    DOB: 26-Nov-1975, 48 y.o.   MRN: [id number]  Chief Complaint  Patient presents with   Establish Care    Referring provider: Alston Silvio BROCKS, FNP  HPI: Patient is a 48 year old female history of hypertension, atrial fibrillation, CKD V s/p fistula placement and congestive heart failure who presents today as a new patient consultation for sleep.  Today the patient reports that while she does have issues with sleep she is also concerned about shortness of breath with exertion.   Regarding her sleep, patient reports that she snores, wakes up gasping at times, and wakes up frequently.  She feels as though she does not get restful sleep and is quite sleepy throughout the day.  This is resulted in her falling asleep at work, as well as taking naps in the afternoon to compensate.  Epworth of 12 noted for today's appointment.  She has not had a sleep test or overnight oximetry in the past. She reports that she had a recent admission about 1 month ago for volume overload.  Her medical history includes congestive heart failure as well as end-stage renal disease with a recent AV fistula placement.  During her recent admission she had significant IV diuresis with Lasix  and received transfusion of a couple units of PRBCs due to a hemoglobin below 7.  Her hemoglobin at discharge was 7.2.  She was discharged home on oxygen at 2 L via nasal cannula around-the-clock.  Currently, she denies any chest pain, peripheral edema or swelling, worsening dyspnea, headache, dizziness, lightheadedness, fever, chills, productive cough, or other complaints.  Her main complaint is dyspnea that is now limiting her ADLs.  TEST/EVENTS : Recent hospitalization (June 2025) with IV diuresis with Lasix  and transfusion of packed red blood cells.  Allergies  Allergen Reactions   Ozempic (0.25 Or 0.5 Mg-Dose) [Semaglutide(0.25 Or 0.5mg -Dos)] Nausea And Vomiting    Immunization History  Administered  Date(s) Administered   Influenza, Mdck, Trivalent,PF 6+ MOS(egg free) 03/26/2019   Influenza, Seasonal, Injecte, Preservative Fre 04/20/2023   Influenza,inj,Quad PF,6+ Mos 05/11/2022   PNEUMOCOCCAL CONJUGATE-20 04/20/2023   Pneumococcal Conjugate-13 03/26/2019    Past Medical History:  Diagnosis Date   Anemia    hx iron  infusions   Anxiety    CHF (congestive heart failure) (HCC) 09/26/2023   chronic diastolic - ER Visit   Chronic kidney disease    Complication of anesthesia    Diabetes mellitus without complication (HCC)    type 2, does not check blood sugar   Dyspnea    Dysrhythmia 01/2019   PAF   GERD (gastroesophageal reflux disease)    History of hiatal hernia    Hypertension    PONV (postoperative nausea and vomiting)     Tobacco History: Social History   Tobacco Use  Smoking Status Former   Current packs/day: 0.00   Types: Cigarettes   Quit date: 1993   Years since quitting: 32.5   Passive exposure: Past  Smokeless Tobacco Never   Counseling given: Not Answered   Outpatient Medications Prior to Visit  Medication Sig Dispense Refill   acetaminophen  (TYLENOL ) 500 MG tablet Take 1,000 mg by mouth every 6 (six) hours as needed for moderate pain (pain score 4-6).     amLODipine  (NORVASC ) 10 MG tablet Take 10 mg by mouth daily.     atorvastatin  (LIPITOR) 80 MG tablet Take 80 mg by mouth daily.     calcitRIOL  (ROCALTROL ) 0.5 MCG capsule Take 0.5 mcg  by mouth daily.     citalopram  (CELEXA ) 20 MG tablet Take 20 mg by mouth daily.     ELIQUIS  5 MG TABS tablet Take 1 tablet (5 mg total) by mouth 2 (two) times daily. Resume from 04/30/2023     ferrous sulfate  325 (65 FE) MG tablet Take 325 mg by mouth daily.     fluticasone (FLONASE) 50 MCG/ACT nasal spray Place 1 spray into both nostrils once as needed for allergies or rhinitis.     hydrALAZINE  (APRESOLINE ) 100 MG tablet Take 1 tablet (100 mg total) by mouth every 8 (eight) hours. 90 tablet 0    HYDROcodone -acetaminophen  (NORCO/VICODIN) 5-325 MG tablet Take 1 tablet by mouth every 6 (six) hours as needed for moderate pain (pain score 4-6). 15 tablet 0   Hypertonic Nasal Wash (NASAFLO PORCELAIN NASAL RINSE NA) Place 1 each into the nose once as needed (for congestion).     loratadine  (CLARITIN ) 10 MG tablet Take 10 mg by mouth daily as needed for allergies.     metoprolol  succinate (TOPROL -XL) 100 MG 24 hr tablet Take 1 tablet (100 mg total) by mouth daily. Take with or immediately following a meal. 30 tablet 0   OXYGEN Inhale 2 L into the lungs daily.     pantoprazole  (PROTONIX ) 40 MG tablet Take 40 mg by mouth daily.     potassium chloride  SA (KLOR-CON  M) 20 MEQ tablet Take 1 tablet (20 mEq total) by mouth daily. 60 tablet 1   sodium bicarbonate  650 MG tablet Take 650 mg by mouth 2 (two) times daily.     torsemide  (DEMADEX ) 20 MG tablet Take 4 tablets (80 mg total) by mouth 2 (two) times daily. increase to 100 BID if patient has wt gain/edema/orthopnea. 120 tablet 1   TRADJENTA  5 MG TABS tablet Take 5 mg by mouth daily.     No facility-administered medications prior to visit.     Review of Systems:   Constitutional:   No  weight loss, night sweats,  Fevers, chills, fatigue, or  lassitude.  Positive for: Daytime sleepiness, snoring, frequent naps, falling asleep at work.  HEENT:   No headaches,  Difficulty swallowing,  Tooth/dental problems, or  Sore throat,                No sneezing, itching, ear ache, nasal congestion, post nasal drip,   CV:  No chest pain,  Orthopnea, swelling in lower extremities, anasarca, dizziness, palpitations, syncope.  Positive for PND  GI  No heartburn, indigestion, abdominal pain, nausea, vomiting, diarrhea, change in bowel habits, loss of appetite, bloody stools.   Resp: Positive for shortness of breath with exertion.  No excess mucus, no productive cough,  No non-productive cough,  No coughing up of blood.  No change in color of mucus.  No wheezing.   No chest wall deformity.  Skin: no rash or lesions.  GU: no dysuria, change in color of urine, no urgency or frequency.  No flank pain, no hematuria   MS:  No joint pain or swelling.  No decreased range of motion.  No back pain.    Physical Exam  BP 115/70   Pulse 83   Ht 5' 5 (1.651 m)   Wt (!) 305 lb (138.3 kg)   SpO2 97%   BMI 50.75 kg/m   GEN: A/Ox3; pleasant , NAD, well nourished    HEENT:  /AT,  EACs-clear, TMs-wnl, NOSE-clear, THROAT-clear, no lesions, no postnasal drip or exudate noted.  Mallampati 2  NECK:  Supple w/ fair ROM; no JVD; normal carotid impulses w/o bruits; no thyromegaly or nodules palpated; no lymphadenopathy.    RESP  Clear  P & A; w/o, wheezes/ rales/ or rhonchi. no accessory muscle use, no dullness to percussion  CARD:  RRR, no m/r/g, no peripheral edema, pulses intact, no cyanosis or clubbing.  GI:   Soft & nt; obese, nml bowel sounds; no organomegaly or masses detected.   Musco: Warm bil, no deformities or joint swelling noted.   Neuro: alert, no focal deficits noted.    Skin: Warm, no lesions or rashes.  Well-healed incisions right upper arm from fistula placement.    Lab Results:  CBC    Component Value Date/Time   WBC 6.0 12/05/2023 0450   RBC 2.82 (L) 12/05/2023 0450   HGB 8.2 (L) 12/21/2023 0813   HCT 24.0 (L) 12/21/2023 0813   PLT 240 12/05/2023 0450   MCV 85.5 12/05/2023 0450   MCH 25.5 (L) 12/05/2023 0450   MCHC 29.9 (L) 12/05/2023 0450   RDW 16.7 (H) 12/05/2023 0450   LYMPHSABS 0.6 (L) 12/03/2023 1701   MONOABS 0.5 12/03/2023 1701   EOSABS 0.3 12/03/2023 1701   BASOSABS 0.0 12/03/2023 1701    BMET    Component Value Date/Time   NA 139 12/21/2023 0813   K 3.9 12/21/2023 0813   CL 104 12/21/2023 0813   CO2 19 (L) 12/05/2023 0450   GLUCOSE 100 (H) 12/21/2023 0813   BUN 62 (H) 12/21/2023 0813   CREATININE 6.70 (H) 12/21/2023 0813   CALCIUM  8.4 (L) 12/05/2023 0450   GFRNONAA 7 (L) 12/05/2023 0450    BNP     Component Value Date/Time   BNP 572.0 (H) 12/03/2023 1701    ProBNP No results found for: PROBNP  Imaging: No results found.  Administration History     None           No data to display          No results found for: NITRICOXIDE      Assessment & Plan:   Encounter Diagnoses  Name Primary?   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 -  Plan for split-night study in-house as she is currently on oxygen around-the-clock. -  Plan for ollow-up in the office after this is completed to discuss results and potentially institute CPAP therapy  Dyspnea on exertion: -  Multifactorial in the setting of normocytic anemia secondary to CKD stage V, morbid obesity, heart failure, and multiple comorbidities. -  Cannot rule out a pulmonary source for dyspnea however: Will check pulmonary function testing -  Continue oxygen as prescribed  Morbid obesity: -  Discussed activity as tolerated and diet as tolerated -  Discussed the potential of introducing Zepbound pending current workup  Anemia in the setting of CKD stage V: -  Noted; likely large contributor to her dyspnea -  Patient self reports that she has not been started on EPO as of yet   Candis Dandy, PA-C 01/05/2024

## 2024-01-05 NOTE — Telephone Encounter (Signed)
 LVM for patient regarding the 02/09/24 2:00pm PFT appointment at [facility] time is 1:45 pm at the 1st floor registration desk for check in--will mail appointment information and instructions and requested return call with questions or concerns

## 2024-01-05 NOTE — Patient Instructions (Signed)
 Split night study to evaluate for sleep apnea.  RTC in 6-8 weeks for follow up with APP.  Pulmonary function testing to evaluate shortness of breath.  Continue oxygen as prescribed.

## 2024-01-09 ENCOUNTER — Inpatient Hospital Stay (HOSPITAL_COMMUNITY)
Admission: EM | Admit: 2024-01-09 | Discharge: 2024-01-13 | DRG: 291 | Disposition: A | Discharge status: Home / Self Care | Attending: Family Medicine | Admitting: Family Medicine

## 2024-01-09 ENCOUNTER — Other Ambulatory Visit: Payer: Self-pay

## 2024-01-09 ENCOUNTER — Emergency Department (HOSPITAL_COMMUNITY)

## 2024-01-09 DIAGNOSIS — J9601 Acute respiratory failure with hypoxia: Secondary | ICD-10-CM

## 2024-01-09 DIAGNOSIS — I482 Chronic atrial fibrillation, unspecified: Secondary | ICD-10-CM | POA: Diagnosis present

## 2024-01-09 DIAGNOSIS — D509 Iron deficiency anemia, unspecified: Secondary | ICD-10-CM | POA: Diagnosis present

## 2024-01-09 DIAGNOSIS — E441 Mild protein-calorie malnutrition: Secondary | ICD-10-CM | POA: Diagnosis present

## 2024-01-09 DIAGNOSIS — Z87891 Personal history of nicotine dependence: Secondary | ICD-10-CM

## 2024-01-09 DIAGNOSIS — I5033 Acute on chronic diastolic (congestive) heart failure: Secondary | ICD-10-CM | POA: Diagnosis present

## 2024-01-09 DIAGNOSIS — N2581 Secondary hyperparathyroidism of renal origin: Secondary | ICD-10-CM | POA: Diagnosis present

## 2024-01-09 DIAGNOSIS — K219 Gastro-esophageal reflux disease without esophagitis: Secondary | ICD-10-CM | POA: Diagnosis present

## 2024-01-09 DIAGNOSIS — E8809 Other disorders of plasma-protein metabolism, not elsewhere classified: Secondary | ICD-10-CM | POA: Diagnosis present

## 2024-01-09 DIAGNOSIS — Z7984 Long term (current) use of oral hypoglycemic drugs: Secondary | ICD-10-CM

## 2024-01-09 DIAGNOSIS — F419 Anxiety disorder, unspecified: Secondary | ICD-10-CM | POA: Diagnosis present

## 2024-01-09 DIAGNOSIS — Z79899 Other long term (current) drug therapy: Secondary | ICD-10-CM

## 2024-01-09 DIAGNOSIS — D631 Anemia in chronic kidney disease: Secondary | ICD-10-CM | POA: Diagnosis present

## 2024-01-09 DIAGNOSIS — E46 Unspecified protein-calorie malnutrition: Secondary | ICD-10-CM | POA: Insufficient documentation

## 2024-01-09 DIAGNOSIS — E782 Mixed hyperlipidemia: Secondary | ICD-10-CM | POA: Diagnosis present

## 2024-01-09 DIAGNOSIS — I2489 Other forms of acute ischemic heart disease: Secondary | ICD-10-CM | POA: Diagnosis present

## 2024-01-09 DIAGNOSIS — R7989 Other specified abnormal findings of blood chemistry: Secondary | ICD-10-CM | POA: Insufficient documentation

## 2024-01-09 DIAGNOSIS — Z888 Allergy status to other drugs, medicaments and biological substances status: Secondary | ICD-10-CM

## 2024-01-09 DIAGNOSIS — N185 Chronic kidney disease, stage 5: Secondary | ICD-10-CM | POA: Diagnosis present

## 2024-01-09 DIAGNOSIS — I509 Heart failure, unspecified: Secondary | ICD-10-CM | POA: Diagnosis not present

## 2024-01-09 DIAGNOSIS — I132 Hypertensive heart and chronic kidney disease with heart failure and with stage 5 chronic kidney disease, or end stage renal disease: Principal | ICD-10-CM | POA: Diagnosis present

## 2024-01-09 DIAGNOSIS — F32A Depression, unspecified: Secondary | ICD-10-CM | POA: Diagnosis present

## 2024-01-09 DIAGNOSIS — I10 Essential (primary) hypertension: Secondary | ICD-10-CM | POA: Diagnosis present

## 2024-01-09 DIAGNOSIS — E1165 Type 2 diabetes mellitus with hyperglycemia: Secondary | ICD-10-CM | POA: Diagnosis present

## 2024-01-09 DIAGNOSIS — R059 Cough, unspecified: Secondary | ICD-10-CM | POA: Diagnosis not present

## 2024-01-09 DIAGNOSIS — J9621 Acute and chronic respiratory failure with hypoxia: Secondary | ICD-10-CM | POA: Diagnosis present

## 2024-01-09 DIAGNOSIS — Z9981 Dependence on supplemental oxygen: Secondary | ICD-10-CM

## 2024-01-09 DIAGNOSIS — Z6841 Body Mass Index (BMI) 40.0 and over, adult: Secondary | ICD-10-CM

## 2024-01-09 DIAGNOSIS — E1122 Type 2 diabetes mellitus with diabetic chronic kidney disease: Secondary | ICD-10-CM | POA: Diagnosis present

## 2024-01-09 DIAGNOSIS — Z7901 Long term (current) use of anticoagulants: Secondary | ICD-10-CM

## 2024-01-09 NOTE — ED Triage Notes (Signed)
 Pt was at home today and became increasingly more shob as the day went on. Right before calling ems pt has gotten up to go to the bathroom and o2 dropped to 70s and she was not able to get it back up . Ems arrived increased 02 to 6lnc and pt sats 93. Denies cp. Pt recently admitted for anemia. Does not have signs of bleeding.

## 2024-01-09 NOTE — ED Provider Notes (Signed)
 [facility]-EMERGENCY DEPT [facility] Emergency Department Provider Note MRN:  [id number]  Arrival date & time: 01/10/24     Chief Complaint   Shortness of Breath   History of Present Illness   Melissa [name] is a 48 y.o. year-old female with a history of CHF, CKD presenting to the ED with chief complaint of shortness of breath.  Slowly worsening shortness of breath throughout the day today.  Has had 2 decrease her mobility due to her chronic conditions lately.  Today tried to ambulate just a few steps and became much more short of breath than normal.  Hypoxic upon EMS arrival.  Denies chest pain, no recent cough or fever.  No abdominal pain.  Some increase in lower extremity edema today.  Review of Systems  A thorough review of systems was obtained and all systems are negative except as noted in the HPI and PMH.   Patient's Health History    Past Medical History:  Diagnosis Date   Anemia    hx iron  infusions   Anxiety    CHF (congestive heart failure) (HCC) 09/26/2023   chronic diastolic - ER Visit   Chronic kidney disease    Complication of anesthesia    Diabetes mellitus without complication (HCC)    type 2, does not check blood sugar   Dyspnea    Dysrhythmia 01/2019   PAF   GERD (gastroesophageal reflux disease)    History of hiatal hernia    Hypertension    PONV (postoperative nausea and vomiting)     Past Surgical History:  Procedure Laterality Date   AV FISTULA PLACEMENT Right 09/21/2023   Procedure: Right Brachiocephalic ARTERIOVENOUS (AV) FISTULA CREATION;  Surgeon: Magda Debby SAILOR, MD;  Location: [facility] OR;  Service: Vascular;  Laterality: Right;   BIOPSY  02/02/2022   Procedure: BIOPSY;  Surgeon: Eartha Angelia Sieving, MD;  Location: [facility] ENDO SUITE;  Service: Gastroenterology;;   ESOPHAGOGASTRODUODENOSCOPY (EGD) WITH PROPOFOL  N/A 02/02/2022   Procedure: ESOPHAGOGASTRODUODENOSCOPY (EGD) WITH PROPOFOL ;  Surgeon: Eartha Angelia Sieving, MD;  Location: [facility] ENDO  SUITE;  Service: Gastroenterology;  Laterality: N/A;  240   FISTULA SUPERFICIALIZATION Right 12/21/2023   Procedure: SECOND STAGE  BRACHIOCEPHALIC FISTULA SUPERFICIALIZATION;  Surgeon: Magda Debby SAILOR, MD;  Location: [facility] OR;  Service: Vascular;  Laterality: Right;  Peripheral block    No family history on file.  Social History   Socioeconomic History   Marital status: Married    Spouse name: Not on file   Number of children: Not on file   Years of education: Not on file   Highest education level: Not on file  Occupational History   Not on file  Tobacco Use   Smoking status: Former    Current packs/day: 0.00    Types: Cigarettes    Quit date: 94    Years since quitting: 32.5    Passive exposure: Past   Smokeless tobacco: Never  Vaping Use   Vaping status: Never Used  Substance and Sexual Activity   Alcohol use: Not Currently   Drug use: No   Sexual activity: Not Currently    Birth control/protection: None  Other Topics Concern   Not on file  Social History Narrative   Not on file   Social Drivers of Health   Financial Resource Strain: Low Risk  (11/10/2021)   Received from [facility]   Overall Financial Resource Strain (CARDIA)    Difficulty of Paying Living Expenses: Not very hard  Food Insecurity: No Food Insecurity (  12/03/2023)   Hunger Vital Sign    Worried About Running Out of Food in the Last Year: Never true    Ran Out of Food in the Last Year: Never true  Transportation Needs: No Transportation Needs (12/03/2023)   PRAPARE - Administrator, Civil Service (Medical): No    Lack of Transportation (Non-Medical): No  Physical Activity: Not on file  Stress: Not on file  Social Connections: Not on file  Intimate Partner Violence: Not At Risk (12/03/2023)   Humiliation, Afraid, Rape, and Kick questionnaire    Fear of Current or Ex-Partner: No    Emotionally Abused: No    Physically Abused: No    Sexually Abused: No     Physical Exam   Vitals:    01/09/24 2312  BP: (!) 193/82  Pulse: (!) 105  Resp: 20  Temp: 99.2 F (37.3 C)  SpO2: 91%    CONSTITUTIONAL: Well-appearing, NAD NEURO/PSYCH:  Alert and oriented x 3, no focal deficits EYES:  eyes equal and reactive ENT/NECK:  no LAD, no JVD CARDIO: Regular rate, well-perfused, normal S1 and S2 PULM:  CTAB no wheezing or rhonchi GI/GU:  non-distended, non-tender MSK/SPINE:  No gross deformities, no edema SKIN:  no rash, atraumatic   *Additional and/or pertinent findings included in MDM below  Diagnostic and Interventional Summary    EKG Interpretation Date/Time:  Monday January 09 2024 23:11:45 EDT Ventricular Rate:  104 PR Interval:  178 QRS Duration:  81 QT Interval:  334 QTC Calculation: 440 R Axis:   70  Text Interpretation: Sinus tachycardia Probable left atrial enlargement Nonspecific T abnormalities, lateral leads Confirmed by Theadore Sharper [phone]) on 01/09/2024 11:45:26 PM       Labs Reviewed  CBC - Abnormal; Notable for the following components:      Result Value   RBC 2.80 (*)    Hemoglobin 7.2 (*)    HCT 24.0 (*)    MCH 25.7 (*)    RDW 16.7 (*)    All other components within normal limits  COMPREHENSIVE METABOLIC PANEL WITH GFR - Abnormal; Notable for the following components:   CO2 20 (*)    Glucose, Bld 188 (*)    BUN 67 (*)    Creatinine, Ser 6.56 (*)    Calcium  8.5 (*)    Albumin 2.6 (*)    GFR, Estimated 7 (*)    Anion gap 16 (*)    All other components within normal limits  BRAIN NATRIURETIC PEPTIDE - Abnormal; Notable for the following components:   B Natriuretic Peptide 454.0 (*)    All other components within normal limits  TROPONIN I (HIGH SENSITIVITY) - Abnormal; Notable for the following components:   Troponin I (High Sensitivity) 25 (*)    All other components within normal limits  PROTIME-INR  TYPE AND SCREEN  TROPONIN I (HIGH SENSITIVITY)    DG Chest Port 1 View  Final Result      Medications - No data to display    Procedures  /  Critical Care .Ultrasound ED Peripheral IV (Provider)  Date/Time: 01/10/2024 12:13 AM  Performed by: Theadore Sharper HERO, MD Authorized by: Theadore Sharper HERO, MD   Procedure details:    Indications: multiple failed IV attempts     Skin Prep: chlorhexidine  gluconate     Location:  Left anterior forearm   Angiocath:  20 G   Bedside Ultrasound Guided: Yes     Patient tolerated procedure without complications: Yes  Dressing applied: Yes   .Critical Care  Performed by: Theadore Ozell HERO, MD Authorized by: Theadore Ozell HERO, MD   Critical care provider statement:    Critical care time (minutes):  35   Critical care was necessary to treat or prevent imminent or life-threatening deterioration of the following conditions:  Respiratory failure   Critical care was time spent personally by me on the following activities:  Development of treatment plan with patient or surrogate, discussions with consultants, evaluation of patient's response to treatment, examination of patient, ordering and review of laboratory studies, ordering and review of radiographic studies, ordering and performing treatments and interventions, pulse oximetry, re-evaluation of patient's condition and review of old charts   ED Course and Medical Decision Making  Initial Impression and Ddx Suspect CHF exacerbation complicated by patient's advanced CKD.  She has a new AV fistula developing in her right upper extremity with plan to transition her to dialysis soon.  Per chart review has also been struggling with anemia, likely due to chronic disease or CKD.  Denies any blood loss or black stool recently.  All of these could be contributing to her symptoms today.  Not having any chest pain.  Doubt ACS, doubt PE.  Past medical/surgical history that increases complexity of ED encounter: CHF, CKD, anemia  Interpretation of Diagnostics I personally reviewed the EKG and my interpretation is as follows: Sinus rhythm without  significant change from prior  No significant blood count or electrolyte disturbance.  Hemoglobin near recent baseline.  Troponin with minimal elevation, BNP elevated.  Chest x-ray showing evidence of edema.  Patient Reassessment and Ultimate Disposition/Management     Patient continues to require 4 to 5 L nasal cannula above her baseline of 3, will request hospitalist admission.  Patient management required discussion with the following services or consulting groups:  Hospitalist Service  Complexity of Problems Addressed Acute illness or injury that poses threat of life of bodily function  Additional Data Reviewed and Analyzed Further history obtained from: Recent discharge summary and Prior labs/imaging results  Additional Factors Impacting ED Encounter Risk Consideration of hospitalization  Ozell HERO. Theadore, MD [facility] [email or web address]  Final Clinical Impressions(s) / ED Diagnoses     ICD-10-CM   1. Acute congestive heart failure, unspecified heart failure type (HCC)  I50.9     2. Acute respiratory failure with hypoxia (HCC)  J96.01       ED Discharge Orders     None        Discharge Instructions Discussed with and Provided to Patient:   Discharge Instructions   None      Theadore Ozell HERO, MD 01/10/24 0120

## 2024-01-10 ENCOUNTER — Inpatient Hospital Stay (HOSPITAL_COMMUNITY)

## 2024-01-10 ENCOUNTER — Other Ambulatory Visit (HOSPITAL_COMMUNITY): Payer: Self-pay | Admitting: *Deleted

## 2024-01-10 DIAGNOSIS — Z7984 Long term (current) use of oral hypoglycemic drugs: Secondary | ICD-10-CM | POA: Diagnosis not present

## 2024-01-10 DIAGNOSIS — I10 Essential (primary) hypertension: Secondary | ICD-10-CM | POA: Diagnosis not present

## 2024-01-10 DIAGNOSIS — Z9981 Dependence on supplemental oxygen: Secondary | ICD-10-CM | POA: Diagnosis not present

## 2024-01-10 DIAGNOSIS — Z79899 Other long term (current) drug therapy: Secondary | ICD-10-CM | POA: Diagnosis not present

## 2024-01-10 DIAGNOSIS — F419 Anxiety disorder, unspecified: Secondary | ICD-10-CM | POA: Diagnosis present

## 2024-01-10 DIAGNOSIS — Z7901 Long term (current) use of anticoagulants: Secondary | ICD-10-CM | POA: Diagnosis not present

## 2024-01-10 DIAGNOSIS — E782 Mixed hyperlipidemia: Secondary | ICD-10-CM | POA: Insufficient documentation

## 2024-01-10 DIAGNOSIS — E441 Mild protein-calorie malnutrition: Secondary | ICD-10-CM | POA: Diagnosis present

## 2024-01-10 DIAGNOSIS — Z87891 Personal history of nicotine dependence: Secondary | ICD-10-CM | POA: Diagnosis not present

## 2024-01-10 DIAGNOSIS — E1165 Type 2 diabetes mellitus with hyperglycemia: Secondary | ICD-10-CM | POA: Insufficient documentation

## 2024-01-10 DIAGNOSIS — I509 Heart failure, unspecified: Secondary | ICD-10-CM | POA: Diagnosis present

## 2024-01-10 DIAGNOSIS — I482 Chronic atrial fibrillation, unspecified: Secondary | ICD-10-CM | POA: Diagnosis present

## 2024-01-10 DIAGNOSIS — E1122 Type 2 diabetes mellitus with diabetic chronic kidney disease: Secondary | ICD-10-CM | POA: Diagnosis present

## 2024-01-10 DIAGNOSIS — F32A Depression, unspecified: Secondary | ICD-10-CM | POA: Diagnosis present

## 2024-01-10 DIAGNOSIS — K219 Gastro-esophageal reflux disease without esophagitis: Secondary | ICD-10-CM | POA: Diagnosis present

## 2024-01-10 DIAGNOSIS — J9621 Acute and chronic respiratory failure with hypoxia: Secondary | ICD-10-CM | POA: Diagnosis present

## 2024-01-10 DIAGNOSIS — E8809 Other disorders of plasma-protein metabolism, not elsewhere classified: Secondary | ICD-10-CM | POA: Diagnosis present

## 2024-01-10 DIAGNOSIS — N185 Chronic kidney disease, stage 5: Secondary | ICD-10-CM | POA: Diagnosis present

## 2024-01-10 DIAGNOSIS — R7989 Other specified abnormal findings of blood chemistry: Secondary | ICD-10-CM | POA: Insufficient documentation

## 2024-01-10 DIAGNOSIS — N2581 Secondary hyperparathyroidism of renal origin: Secondary | ICD-10-CM | POA: Diagnosis present

## 2024-01-10 DIAGNOSIS — I5033 Acute on chronic diastolic (congestive) heart failure: Secondary | ICD-10-CM | POA: Diagnosis present

## 2024-01-10 DIAGNOSIS — D509 Iron deficiency anemia, unspecified: Secondary | ICD-10-CM | POA: Diagnosis present

## 2024-01-10 DIAGNOSIS — I2489 Other forms of acute ischemic heart disease: Secondary | ICD-10-CM | POA: Diagnosis present

## 2024-01-10 DIAGNOSIS — I132 Hypertensive heart and chronic kidney disease with heart failure and with stage 5 chronic kidney disease, or end stage renal disease: Secondary | ICD-10-CM | POA: Diagnosis present

## 2024-01-10 DIAGNOSIS — I5031 Acute diastolic (congestive) heart failure: Secondary | ICD-10-CM | POA: Diagnosis not present

## 2024-01-10 DIAGNOSIS — Z6841 Body Mass Index (BMI) 40.0 and over, adult: Secondary | ICD-10-CM | POA: Diagnosis not present

## 2024-01-10 DIAGNOSIS — D631 Anemia in chronic kidney disease: Secondary | ICD-10-CM | POA: Diagnosis present

## 2024-01-10 DIAGNOSIS — E46 Unspecified protein-calorie malnutrition: Secondary | ICD-10-CM | POA: Insufficient documentation

## 2024-01-10 LAB — COMPREHENSIVE METABOLIC PANEL WITH GFR
ALT: 12 U/L (ref 0–44)
AST: 18 U/L (ref 15–41)
Albumin: 2.6 g/dL — ABNORMAL LOW (ref 3.5–5.0)
Alkaline Phosphatase: 47 U/L (ref 38–126)
Anion gap: 16 — ABNORMAL HIGH (ref 5–15)
BUN: 67 mg/dL — ABNORMAL HIGH (ref 6–20)
CO2: 20 mmol/L — ABNORMAL LOW (ref 22–32)
Calcium: 8.5 mg/dL — ABNORMAL LOW (ref 8.9–10.3)
Chloride: 103 mmol/L (ref 98–111)
Creatinine, Ser: 6.56 mg/dL — ABNORMAL HIGH (ref 0.44–1.00)
GFR, Estimated: 7 mL/min — ABNORMAL LOW (ref >60)
Glucose, Bld: 188 mg/dL — ABNORMAL HIGH (ref 70–99)
Potassium: 4.2 mmol/L (ref 3.5–5.1)
Sodium: 139 mmol/L (ref 135–145)
Total Bilirubin: 0.6 mg/dL (ref 0.0–1.2)
Total Protein: 6.9 g/dL (ref 6.5–8.1)

## 2024-01-10 LAB — PROTIME-INR
INR: 1.1 (ref 0.8–1.2)
Prothrombin Time: 14.5 s (ref 11.4–15.2)

## 2024-01-10 LAB — IRON AND TIBC
Iron: 24 ug/dL — ABNORMAL LOW (ref 28–170)
Saturation Ratios: 8 % — ABNORMAL LOW (ref 10.4–31.8)
TIBC: 294 ug/dL (ref 250–450)
UIBC: 270 ug/dL

## 2024-01-10 LAB — ECHOCARDIOGRAM LIMITED
Calc EF: 59.8 %
Est EF: >75
Height: 65 in
S' Lateral: 2.1 cm
Single Plane A2C EF: 58.7 %
Single Plane A4C EF: 58.4 %
Weight: 4839.54 [oz_av]

## 2024-01-10 LAB — CBC
HCT: 24 % — ABNORMAL LOW (ref 36.0–46.0)
Hemoglobin: 7.2 g/dL — ABNORMAL LOW (ref 12.0–15.0)
MCH: 25.7 pg — ABNORMAL LOW (ref 26.0–34.0)
MCHC: 30 g/dL (ref 30.0–36.0)
MCV: 85.7 fL (ref 80.0–100.0)
Platelets: 267 K/uL (ref 150–400)
RBC: 2.8 MIL/uL — ABNORMAL LOW (ref 3.87–5.11)
RDW: 16.7 % — ABNORMAL HIGH (ref 11.5–15.5)
WBC: 8.9 K/uL (ref 4.0–10.5)
nRBC: 0 % (ref 0.0–0.2)

## 2024-01-10 LAB — TROPONIN I (HIGH SENSITIVITY)
Troponin I (High Sensitivity): 25 ng/L — ABNORMAL HIGH (ref <18)
Troponin I (High Sensitivity): 33 ng/L — ABNORMAL HIGH (ref <18)
Troponin I (High Sensitivity): 39 ng/L — ABNORMAL HIGH (ref <18)
Troponin I (High Sensitivity): 41 ng/L — ABNORMAL HIGH (ref <18)

## 2024-01-10 LAB — PHOSPHORUS: Phosphorus: 4.9 mg/dL — ABNORMAL HIGH (ref 2.5–4.6)

## 2024-01-10 LAB — GLUCOSE, CAPILLARY
Glucose-Capillary: 98 mg/dL (ref 70–99)
Glucose-Capillary: 122 mg/dL — ABNORMAL HIGH (ref 70–99)
Glucose-Capillary: 165 mg/dL — ABNORMAL HIGH (ref 70–99)

## 2024-01-10 LAB — FERRITIN: Ferritin: 109 ng/mL (ref 11–307)

## 2024-01-10 LAB — CBG MONITORING, ED: Glucose-Capillary: 96 mg/dL (ref 70–99)

## 2024-01-10 LAB — BRAIN NATRIURETIC PEPTIDE: B Natriuretic Peptide: 454 pg/mL — ABNORMAL HIGH (ref 0.0–100.0)

## 2024-01-10 MED ORDER — ATORVASTATIN CALCIUM 40 MG PO TABS
80.0000 mg | ORAL_TABLET | Freq: Every day | ORAL | Status: DC
  Administered 2024-01-10 – 2024-01-13 (×4): 80 mg via ORAL
  Filled 2024-01-10 (×4): qty 2

## 2024-01-10 MED ORDER — METOPROLOL SUCCINATE ER 50 MG PO TB24
100.0000 mg | ORAL_TABLET | Freq: Every day | ORAL | Status: DC
Start: 2024-01-10 — End: 2024-01-13
  Administered 2024-01-10 – 2024-01-13 (×4): 100 mg via ORAL
  Filled 2024-01-10 (×5): qty 2

## 2024-01-10 MED ORDER — METOLAZONE 2.5 MG PO TABS
5.0000 mg | ORAL_TABLET | Freq: Once | ORAL | Status: AC
  Administered 2024-01-10: 5 mg via ORAL
  Filled 2024-01-10: qty 2

## 2024-01-10 MED ORDER — FUROSEMIDE 10 MG/ML IJ SOLN
40.0000 mg | Freq: Once | INTRAMUSCULAR | Status: AC
  Administered 2024-01-10: 40 mg via INTRAVENOUS
  Filled 2024-01-10: qty 4

## 2024-01-10 MED ORDER — FUROSEMIDE 10 MG/ML IJ SOLN
120.0000 mg | Freq: Three times a day (TID) | INTRAVENOUS | Status: DC
  Administered 2024-01-10 – 2024-01-11 (×5): 120 mg via INTRAVENOUS
  Filled 2024-01-10 (×10): qty 12

## 2024-01-10 MED ORDER — INSULIN ASPART 100 UNIT/ML IJ SOLN
0.0000 [IU] | Freq: Three times a day (TID) | INTRAMUSCULAR | Status: DC
  Administered 2024-01-11 (×2): 1 [IU] via SUBCUTANEOUS
  Administered 2024-01-12: 2 [IU] via SUBCUTANEOUS
  Administered 2024-01-13: 1 [IU] via SUBCUTANEOUS

## 2024-01-10 MED ORDER — FUROSEMIDE 10 MG/ML IJ SOLN
80.0000 mg | Freq: Once | INTRAMUSCULAR | Status: AC
  Administered 2024-01-10: 80 mg via INTRAVENOUS
  Filled 2024-01-10: qty 8

## 2024-01-10 MED ORDER — FUROSEMIDE 10 MG/ML IJ SOLN
120.0000 mg | Freq: Two times a day (BID) | INTRAVENOUS | Status: DC
  Filled 2024-01-10 (×2): qty 12

## 2024-01-10 MED ORDER — PANTOPRAZOLE SODIUM 40 MG PO TBEC
40.0000 mg | DELAYED_RELEASE_TABLET | Freq: Every day | ORAL | Status: DC
  Administered 2024-01-10 – 2024-01-13 (×4): 40 mg via ORAL
  Filled 2024-01-10 (×4): qty 1

## 2024-01-10 MED ORDER — SODIUM CHLORIDE 0.9 % IV SOLN
500.0000 mg | Freq: Every day | INTRAVENOUS | Status: AC
  Administered 2024-01-10 – 2024-01-11 (×2): 500 mg via INTRAVENOUS
  Filled 2024-01-10 (×2): qty 20

## 2024-01-10 MED ORDER — FERROUS SULFATE 325 (65 FE) MG PO TABS
325.0000 mg | ORAL_TABLET | Freq: Every day | ORAL | Status: DC
  Administered 2024-01-10 – 2024-01-13 (×4): 325 mg via ORAL
  Filled 2024-01-10 (×4): qty 1

## 2024-01-10 MED ORDER — GLUCERNA SHAKE PO LIQD
237.0000 mL | Freq: Three times a day (TID) | ORAL | Status: DC
  Filled 2024-01-10 (×5): qty 237

## 2024-01-10 MED ORDER — APIXABAN 5 MG PO TABS
5.0000 mg | ORAL_TABLET | Freq: Two times a day (BID) | ORAL | Status: DC
  Administered 2024-01-10 – 2024-01-13 (×7): 5 mg via ORAL
  Filled 2024-01-10 (×7): qty 1

## 2024-01-10 MED ORDER — FUROSEMIDE 10 MG/ML IJ SOLN: 120.0000 mg | Freq: Two times a day (BID) | INTRAVENOUS | Status: DC

## 2024-01-10 MED ORDER — FUROSEMIDE 10 MG/ML IJ SOLN
INTRAMUSCULAR | Status: AC
  Filled 2024-01-10: qty 4

## 2024-01-10 MED ORDER — CALCITRIOL 0.25 MCG PO CAPS
0.5000 ug | ORAL_CAPSULE | Freq: Every day | ORAL | Status: DC
  Administered 2024-01-10 – 2024-01-13 (×4): 0.5 ug via ORAL
  Filled 2024-01-10 (×4): qty 2

## 2024-01-10 MED ORDER — CITALOPRAM HYDROBROMIDE 20 MG PO TABS
20.0000 mg | ORAL_TABLET | Freq: Every day | ORAL | Status: DC
  Administered 2024-01-10 – 2024-01-13 (×4): 20 mg via ORAL
  Filled 2024-01-10 (×4): qty 1

## 2024-01-10 MED ORDER — IRON SUCROSE 500 MG IVPB - SIMPLE MED
500.0000 mg | Freq: Every day | INTRAVENOUS | Status: DC
  Filled 2024-01-10: qty 275

## 2024-01-10 MED ORDER — FUROSEMIDE 10 MG/ML IJ SOLN
80.0000 mg | Freq: Two times a day (BID) | INTRAMUSCULAR | Status: DC
  Administered 2024-01-10: 80 mg via INTRAVENOUS
  Filled 2024-01-10: qty 8

## 2024-01-10 NOTE — H&P (Signed)
 History and Physical    Patient: Melissa [name] FMW:[id number] DOB: [birth year] DOA: 01/09/2024 DOS: the patient was seen and examined on 01/10/2024 PCP: BucioSilvio BROCKS, FNP  Patient coming from: Home  Chief Complaint:  Chief Complaint  Patient presents with   Shortness of Breath   HPI: Melissa [name] is a 48 y.o. 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  Patient complained that shortness of breath progressively worsened throughout the day yesterday and this worsened even when she takes a few steps.  EMS was activated and on arrival of EMS team O2 sat was in the 70s on 2 LPM of oxygen.  This was increased to 6 LPM with O2 sats improving to 93%.  She denied chest pain.  Patient was taken to the ED for further evaluation and management.  She was recently admitted from 6/7 to 6/9 due to acute on chronic diastolic heart failure in the setting of CKD stage V and she was treated with IV Lasix  from 20 mg every 8 hours.  Nephrologist Dr. Marlee was consulted at that time.  ED Course:  In the emergency department, she was tachycardic and intermittently tachypneic.  BP on arrival was 193/82, other vital signs were within normal range. Workup in the ED showed normocytic anemia, BMP was normal except for bicarb of 120, blood glucose 188.  BUN/creatinine 67/6.56, albumin 2.6.  BNP 454 (this was 572 on 12/03/2023), troponin 25 > 33.. Chest x-ray showed cardiomegaly with central pulmonary vascular congestion. Mild strandy and patchy opacities in the left mid and lower lung, which may represent edema or atelectasis. IV Lasix  80 mg x 1 was given.  TRH was asked to admit patient  Review of Systems: Review of systems as noted in the HPI. All other  systems reviewed and are negative.   Past Medical History:  Diagnosis Date   Anemia    hx iron  infusions   Anxiety    CHF (congestive heart failure) (HCC) 09/26/2023   chronic diastolic - ER Visit   Chronic kidney disease    Complication of anesthesia    Diabetes mellitus without complication (HCC)    type 2, does not check blood sugar   Dyspnea    Dysrhythmia 01/2019   PAF   GERD (gastroesophageal reflux disease)    History of hiatal hernia    Hypertension    PONV (postoperative nausea and vomiting)    Past Surgical History:  Procedure Laterality Date   AV FISTULA PLACEMENT Right 09/21/2023   Procedure: Right Brachiocephalic ARTERIOVENOUS (AV) FISTULA CREATION;  Surgeon: Magda Debby SAILOR, MD;  Location: [facility] OR;  Service: Vascular;  Laterality: Right;   BIOPSY  02/02/2022   Procedure: BIOPSY;  Surgeon: Eartha Angelia Sieving, MD;  Location: [facility] SUITE;  Service: Gastroenterology;;   ESOPHAGOGASTRODUODENOSCOPY (EGD) WITH PROPOFOL  N/A 02/02/2022   Procedure: ESOPHAGOGASTRODUODENOSCOPY (EGD) WITH PROPOFOL ;  Surgeon: Eartha Angelia Sieving, MD;  Location: [facility];  Service: Gastroenterology;  Laterality: N/A;  240   FISTULA SUPERFICIALIZATION Right 12/21/2023   Procedure: SECOND STAGE  BRACHIOCEPHALIC FISTULA SUPERFICIALIZATION;  Surgeon: Magda Debby SAILOR, MD;  Location: [facility] OR;  Service: Vascular;  Laterality: Right;  Peripheral block    Social History:  reports that she quit smoking about 32 years  ago. Her smoking use included cigarettes. She has been exposed to tobacco smoke. She has never used smokeless tobacco. She reports that she does not currently use alcohol. She reports that she does not use drugs.   Allergies  Allergen Reactions   Ozempic (0.25 Or 0.5 Mg-Dose) [Semaglutide(0.25 Or 0.5mg -Dos)] Nausea And Vomiting    No family history on file.    Prior to Admission medications   Medication Sig Start Date End Date Taking? Authorizing Provider   acetaminophen  (TYLENOL ) 500 MG tablet Take 1,000 mg by mouth every 6 (six) hours as needed for moderate pain (pain score 4-6).    [provider]  amLODipine  (NORVASC ) 10 MG tablet Take 10 mg by mouth daily. 12/16/21   [provider]  atorvastatin  (LIPITOR) 80 MG tablet Take 80 mg by mouth daily. 07/08/23   [provider]  calcitRIOL  (ROCALTROL ) 0.5 MCG capsule Take 0.5 mcg by mouth daily. 09/16/23   [provider]  citalopram  (CELEXA ) 20 MG tablet Take 20 mg by mouth daily. 05/07/19   [provider]  ELIQUIS  5 MG TABS tablet Take 1 tablet (5 mg total) by mouth 2 (two) times daily. Resume from 04/30/2023 09/23/23   Bethanie Cough, PA-C  ferrous sulfate  325 (65 FE) MG tablet Take 325 mg by mouth daily.    [provider]  fluticasone (FLONASE) 50 MCG/ACT nasal spray Place 1 spray into both nostrils once as needed for allergies or rhinitis.    [provider]  hydrALAZINE  (APRESOLINE ) 100 MG tablet Take 1 tablet (100 mg total) by mouth every 8 (eight) hours. 04/29/23   Cheryle Page, MD  HYDROcodone -acetaminophen  (NORCO/VICODIN) 5-325 MG tablet Take 1 tablet by mouth every 6 (six) hours as needed for moderate pain (pain score 4-6). 12/21/23   Bethanie Cough, PA-C  Hypertonic Nasal Wash (NASAFLO PORCELAIN NASAL RINSE NA) Place 1 each into the nose once as needed (for congestion).    [provider]  loratadine  (CLARITIN ) 10 MG tablet Take 10 mg by mouth daily as needed for allergies.    [provider]  metoprolol  succinate (TOPROL -XL) 100 MG 24 hr tablet Take 1 tablet (100 mg total) by mouth daily. Take with or immediately following a meal. 04/30/23   Cheryle Page, MD  OXYGEN Inhale 2 L into the lungs daily.    [provider]  pantoprazole  (PROTONIX ) 40 MG tablet Take 40 mg by mouth daily.    [provider]  potassium chloride  SA (KLOR-CON  M) 20 MEQ tablet Take 1 tablet (20 mEq total) by mouth daily.  07/26/23   Maree, Pratik D, DO  sodium bicarbonate  650 MG tablet Take 650 mg by mouth 2 (two) times daily. 07/08/23   [provider]  torsemide  (DEMADEX ) 20 MG tablet Take 4 tablets (80 mg total) by mouth 2 (two) times daily. increase to 100 BID if patient has wt gain/edema/orthopnea. 12/05/23 03/04/24  ShahmehdiAdriana LABOR, MD  TRADJENTA  5 MG TABS tablet Take 5 mg by mouth daily. 07/08/23   [provider]    Physical Exam: BP (!) 155/80   Pulse 88   Temp 99 F (37.2 C)   Resp 16   Ht 5' 5 (1.651 m)   Wt (!) 137.9 kg   SpO2 95%   BMI 50.59 kg/m   General: 48 y.o. year-old female well developed well nourished in no acute distress.  Alert and oriented x3. HEENT: NCAT, EOMI Neck: Supple, trachea medial Cardiovascular: Regular rate and rhythm with no rubs  or gallops.  No thyromegaly or JVD noted.  No lower extremity edema. 2/4 pulses in all 4 extremities. Respiratory: Clear to auscultation with no wheezes or rales. Good inspiratory effort. Abdomen: Soft, nontender nondistended with normal bowel sounds x4 quadrants. Muskuloskeletal: No cyanosis, clubbing or edema noted bilaterally Neuro: CN II-XII intact, strength 5/5 x 4, sensation, reflexes intact Skin: No ulcerative lesions noted or rashes Psychiatry: Judgement and insight appear normal. Mood is appropriate for condition and setting          Labs on Admission:  Basic Metabolic Panel: Recent Labs  Lab 01/09/24 2355  NA 139  K 4.2  CL 103  CO2 20*  GLUCOSE 188*  BUN 67*  CREATININE 6.56*  CALCIUM  8.5*   Liver Function Tests: Recent Labs  Lab 01/09/24 2355  AST 18  ALT 12  ALKPHOS 47  BILITOT 0.6  PROT 6.9  ALBUMIN 2.6*   No results for input(s): LIPASE, AMYLASE in the last 168 hours. No results for input(s): AMMONIA in the last 168 hours. CBC: Recent Labs  Lab 01/09/24 2355  WBC 8.9  HGB 7.2*  HCT 24.0*  MCV 85.7  PLT 267   Cardiac Enzymes: No results for input(s): CKTOTAL, CKMB,  CKMBINDEX, TROPONINI in the last 168 hours.  BNP (last 3 results) Recent Labs    10/01/23 0454 12/03/23 1701 01/09/24 2355  BNP 364.2* 572.0* 454.0*    ProBNP (last 3 results) No results for input(s): PROBNP in the last 8760 hours.  CBG: No results for input(s): GLUCAP in the last 168 hours.  Radiological Exams on Admission: DG Chest Port 1 View Result Date: 01/09/2024 CLINICAL DATA:  Shortness of breath EXAM: PORTABLE CHEST 1 VIEW COMPARISON:  chest x-ray 12/03/2023 FINDINGS: The heart is enlarged. There central pulmonary vascular congestion. There are mild strandy and patchy opacities in the left mid and lower lung. There is no pleural effusion or pneumothorax. The heart is enlarged, unchanged. No pneumothorax or acute fracture. IMPRESSION: 1. Cardiomegaly with central pulmonary vascular congestion. 2. Mild strandy and patchy opacities in the left mid and lower lung, which may represent edema or atelectasis. Electronically Signed   By: Greig Pique M.D.   On: 01/09/2024 23:42    EKG: I independently viewed the EKG done and my findings are as followed: Sinus tachycardia at a rate of 104 bpm  Assessment/Plan Present on Admission:  Acute on chronic diastolic CHF (congestive heart failure) (HCC)  Acute on chronic respiratory failure with hypoxia (HCC)  Atrial fibrillation, chronic (HCC)  Essential hypertension  Principal Problem:   Acute on chronic diastolic CHF (congestive heart failure) (HCC) Active Problems:   Atrial fibrillation, chronic (HCC)   Morbid obesity with BMI of 50.0-59.9, adult (HCC)   Acute on chronic respiratory failure with hypoxia (HCC)   Essential hypertension   Elevated troponin   Type 2 diabetes mellitus with hyperglycemia (HCC)   Hypoalbuminemia due to protein-calorie malnutrition (HCC)   Mixed hyperlipidemia   Anxiety and depression   GERD (gastroesophageal reflux disease)  Acute on chronic diastolic CHF Elevated BNP BNP 545(uypd was 572  on 12/03/2023) Continue total input/output, daily weights and fluid restriction Continue IV Lasix  80 mg twice daily Continue heart healthy/modified carb diet Echocardiogram done 10/16/2023 showed LVEF of 75%.  Moderate concentric LVH.  G2 DD.  Echocardiogram will be done in the morning   Elevated troponin possibly secondary to type II demand ischemia Troponin 25 > 33, she denies chest pain EKG showed no acute changes Continue to  trend troponin  Acute on chronic respiratory failure with hypoxia Patient supplemental oxygen neurowise to the ED was 6 LPM, this was weaned down to 2.5 LPM at bedside  Type 2 diabetes mellitus with hyperglycemia Continue ISS and hypoglycemia Hemoglobin A1c will be checked  Hypoalbuminemia Albumin 2.6; protein supplement will be provided  Atrial Fibrillation, chronic Continue Metoprolol  Succinate 100 mg po daily  Continue with Eliquis  for anticoagulation .    Essential hypertension Continue with home meds   Mixed hyperlipidemia Continue with Lipitor   Depression and Anxiety Continue with citalopram    GERD -Continue with Protonix    Morbid Obesity Body mass index is 50.59 kg/m. Diet and lifestyle modification Patient may need to follow-up with outpatient PCP for weight loss program   DVT prophylaxis: Eliquis   Code Status: Full code  Family Communication: None at bedside  Consults: Nephrology  Severity of Illness: The appropriate patient status for this patient is INPATIENT. Inpatient status is judged to be reasonable and necessary in order to provide the required intensity of service to ensure the patient's safety. The patient's presenting symptoms, physical exam findings, and initial radiographic and laboratory data in the context of their chronic comorbidities is felt to place them at high risk for further clinical deterioration. Furthermore, it is not anticipated that the patient will be medically stable for discharge from the hospital  within 2 midnights of admission.   * I certify that at the point of admission it is my clinical judgment that the patient will require inpatient hospital care spanning beyond 2 midnights from the point of admission due to high intensity of service, high risk for further deterioration and high frequency of surveillance required.*  Author: Coleen Cardiff, DO 01/10/2024 8:00 AM  For on call review [email or web address].

## 2024-01-10 NOTE — Consult Note (Signed)
 Nephrology Consult   Requesting provider: Harlene Bowl Service requesting consult: Hospitalist Reason for consult: volume overload, CKD V   Assessment/Recommendations: Melissa [name] is a/an 48 y.o. female with a past medical history of CKD 5, HTN, atrial fibrillation, HLD, obesity who presents with volume overload and acute hypoxic respiratory failure  CKD V: Mild uremic symptoms.  CKD complicated by recurrent volume overload.  She is very near starting dialysis.  If she fails to respond significantly to diuresis may start this hospitalization with dialysis catheter.  Discussed with Dr. Magda and would like to wait at least 6 weeks from most recent surgery before accessing her current fistula -Diuresis as below -Continue to monitor for worsening uremic symptoms -Consider dialysis catheter placement tomorrow if she does not improve -Continue to monitor daily Cr, Dose meds for GFR -Monitor Daily I/Os, Daily weight  -Maintain MAP>65 for optimal renal perfusion.  -Avoid nephrotoxic medications including NSAIDs -Use synthetic opioids (Fentanyl /Dilaudid ) if needed  Acute on chronic hypoxic respiratory failure: Secondary to volume overload despite torsemide  80 mg twice daily at home.  Will give IV Lasix  120 (already has received 80 mg so we will give an additional 40) now and continue this dose every 8 hours.  Will also give metolazone  5 mg.  If she fails to improve by tomorrow likely start dialysis at that time  Anemia: Likely related to CKD.  Possible iron  deficiency.  Obtain iron  studies today.  Transfuse as needed for hemoglobin less than 7  Secondary hyperparathyroidism: Continue home calcitriol .  Obtain phosphorus  Uncontrolled Diabetes Mellitus Type 2 with Hyperglycemia: Management per primary team  Hypertension: Continue home medications and diuresis as above   Recommendations conveyed to primary service.    Jayson JINNY Player [facility] 01/10/2024 8:27  AM   _____________________________________________________________________________________ CC: Shortness of breath  History of Present Illness: Melissa [name] is a/an 48 y.o. female with a past medical history of CKD 5, HTN, atrial fibrillation, HLD, obesity who presents with dyspnea on exertion.  Patient states that yesterday she developed more significant shortness of breath particularly with activity.  She has had some shortness of breath for months but feels like it got worse yesterday.  She has not noticed as much edema as usual.  She did have a coughing spell as well as some nausea yesterday.  Appetite is okay.  Overall feels tired and unwell but denies fevers, chills, diarrhea, dysuria, hematuria.  Compliant with her diuretics but feels like she is not peeing as much as she used to.  In the emergency department her creatinine appeared to be around her baseline is 6.6, electrolytes are reassuring, minimal elevation in troponin, hemoglobin low at 7.2.  Chest x-ray consistent with pulmonary edema.  She was recently admitted in June for similar issues.  She underwent second stage brachiocephalic fistula on 6/23 with Dr. Magda.  She follows with Dr. Dennise mostly in the [place] clinic.  Medications:  Current Facility-Administered Medications  Medication Dose Route Frequency Provider Last Rate Last Admin   apixaban  (ELIQUIS ) tablet 5 mg  5 mg Oral BID Adefeso, Oladapo, DO       atorvastatin  (LIPITOR) tablet 80 mg  80 mg Oral Daily Adefeso, Oladapo, DO       citalopram  (CELEXA ) tablet 20 mg  20 mg Oral Daily Adefeso, Oladapo, DO       feeding supplement (GLUCERNA SHAKE) (GLUCERNA SHAKE) liquid 237 mL  237 mL Oral TID BM Adefeso, Oladapo, DO       ferrous  sulfate tablet 325 mg  325 mg Oral Daily Adefeso, Oladapo, DO       furosemide  (LASIX ) 120 mg in dextrose  5 % 50 mL IVPB  120 mg Intravenous TID Michaeline Eckersley J, MD       furosemide  (LASIX ) injection 40 mg  40 mg Intravenous Once Suzana Sohail,  Everton Bertha J, MD       insulin  aspart (novoLOG ) injection 0-6 Units  0-6 Units Subcutaneous TID WC Adefeso, Oladapo, DO       metolazone  (ZAROXOLYN ) tablet 5 mg  5 mg Oral Once Posey Petrik J, MD       metoprolol  succinate (TOPROL -XL) 24 hr tablet 100 mg  100 mg Oral Daily Adefeso, Oladapo, DO       pantoprazole  (PROTONIX ) EC tablet 40 mg  40 mg Oral Daily Adefeso, Oladapo, DO       Current Outpatient Medications  Medication Sig Dispense Refill   acetaminophen  (TYLENOL ) 500 MG tablet Take 1,000 mg by mouth every 6 (six) hours as needed for moderate pain (pain score 4-6).     amLODipine  (NORVASC ) 10 MG tablet Take 10 mg by mouth daily.     atorvastatin  (LIPITOR) 80 MG tablet Take 80 mg by mouth daily.     calcitRIOL  (ROCALTROL ) 0.5 MCG capsule Take 0.5 mcg by mouth daily.     citalopram  (CELEXA ) 20 MG tablet Take 20 mg by mouth daily.     ELIQUIS  5 MG TABS tablet Take 1 tablet (5 mg total) by mouth 2 (two) times daily. Resume from 04/30/2023     ferrous sulfate  325 (65 FE) MG tablet Take 325 mg by mouth daily.     fluticasone (FLONASE) 50 MCG/ACT nasal spray Place 1 spray into both nostrils once as needed for allergies or rhinitis.     hydrALAZINE  (APRESOLINE ) 100 MG tablet Take 1 tablet (100 mg total) by mouth every 8 (eight) hours. 90 tablet 0   HYDROcodone -acetaminophen  (NORCO/VICODIN) 5-325 MG tablet Take 1 tablet by mouth every 6 (six) hours as needed for moderate pain (pain score 4-6). 15 tablet 0   Hypertonic Nasal Wash (NASAFLO PORCELAIN NASAL RINSE NA) Place 1 each into the nose once as needed (for congestion).     loratadine  (CLARITIN ) 10 MG tablet Take 10 mg by mouth daily as needed for allergies.     metoprolol  succinate (TOPROL -XL) 100 MG 24 hr tablet Take 1 tablet (100 mg total) by mouth daily. Take with or immediately following a meal. 30 tablet 0   OXYGEN Inhale 2 L into the lungs daily.     pantoprazole  (PROTONIX ) 40 MG tablet Take 40 mg by mouth daily.     potassium chloride  SA  (KLOR-CON  M) 20 MEQ tablet Take 1 tablet (20 mEq total) by mouth daily. 60 tablet 1   sodium bicarbonate  650 MG tablet Take 650 mg by mouth 2 (two) times daily.     torsemide  (DEMADEX ) 20 MG tablet Take 4 tablets (80 mg total) by mouth 2 (two) times daily. increase to 100 BID if patient has wt gain/edema/orthopnea. 120 tablet 1   TRADJENTA  5 MG TABS tablet Take 5 mg by mouth daily.       ALLERGIES Ozempic (0.25 or 0.5 mg-dose) [semaglutide(0.25 or 0.5mg -dos)]  MEDICAL HISTORY Past Medical History:  Diagnosis Date   Anemia    hx iron  infusions   Anxiety    CHF (congestive heart failure) (HCC) 09/26/2023   chronic diastolic - ER Visit   Chronic kidney disease    Complication of  anesthesia    Diabetes mellitus without complication (HCC)    type 2, does not check blood sugar   Dyspnea    Dysrhythmia 01/2019   PAF   GERD (gastroesophageal reflux disease)    History of hiatal hernia    Hypertension    PONV (postoperative nausea and vomiting)      SOCIAL HISTORY Social History   Socioeconomic History   Marital status: Married    Spouse name: Not on file   Number of children: Not on file   Years of education: Not on file   Highest education level: Not on file  Occupational History   Not on file  Tobacco Use   Smoking status: Former    Current packs/day: 0.00    Types: Cigarettes    Quit date: 41    Years since quitting: 32.5    Passive exposure: Past   Smokeless tobacco: Never  Vaping Use   Vaping status: Never Used  Substance and Sexual Activity   Alcohol use: Not Currently   Drug use: No   Sexual activity: Not Currently    Birth control/protection: None  Other Topics Concern   Not on file  Social History Narrative   Not on file   Social Drivers of Health   Financial Resource Strain: Low Risk  (11/10/2021)   Received from [facility]   Overall Financial Resource Strain (CARDIA)    Difficulty of Paying Living Expenses: Not very hard  Food Insecurity: No  Food Insecurity (12/03/2023)   Hunger Vital Sign    Worried About Running Out of Food in the Last Year: Never true    Ran Out of Food in the Last Year: Never true  Transportation Needs: No Transportation Needs (12/03/2023)   PRAPARE - Administrator, Civil Service (Medical): No    Lack of Transportation (Non-Medical): No  Physical Activity: Not on file  Stress: Not on file  Social Connections: Not on file  Intimate Partner Violence: Not At Risk (12/03/2023)   Humiliation, Afraid, Rape, and Kick questionnaire    Fear of Current or Ex-Partner: No    Emotionally Abused: No    Physically Abused: No    Sexually Abused: No     FAMILY HISTORY No family history on file.    Review of Systems: 12 systems reviewed Otherwise as per HPI, all other systems reviewed and negative  Physical Exam: Vitals:   01/10/24 0621 01/10/24 0622  BP: (!) 155/80   Pulse: 88 88  Resp: 19 16  Temp: 99 F (37.2 C)   SpO2: 95% 95%   No intake/output data recorded. No intake or output data in the 24 hours ending 01/10/24 0827 General: Tired-appearing, no acute distress HEENT: anicteric sclera, oropharynx clear without lesions CV: Normal rate, no rub, trace pitting edema Lungs: Crackles present at the bases bilaterally, normal work of breathing Abd: soft, non-tender, non-distended Skin: no visible lesions or rashes, well-healed surgical scar on right arm Psych: alert, engaged, appropriate mood and affect Musculoskeletal: no obvious deformities, right upper extremity AV fistula with good bruit and thrill Neuro: normal speech, no gross focal deficits   Test Results Reviewed Lab Results  Component Value Date   NA 139 01/09/2024   K 4.2 01/09/2024   CL 103 01/09/2024   CO2 20 (L) 01/09/2024   BUN 67 (H) 01/09/2024   CREATININE 6.56 (H) 01/09/2024   CALCIUM  8.5 (L) 01/09/2024   ALBUMIN 2.6 (L) 01/09/2024   PHOS 5.7 (H) 12/03/2023  CBC Recent Labs  Lab 01/09/24 2355  WBC 8.9  HGB  7.2*  HCT 24.0*  MCV 85.7  PLT 267    I have reviewed all relevant outside healthcare records related to the patient's current hospitalization

## 2024-01-10 NOTE — Progress Notes (Signed)
 Patient admitted after midnight, please see H&P.  Here with SOB from suspected volume overload.   Acute on chronic diastolic CHF Continue total input/output, daily weights and fluid restriction Continue IV Lasix  120 mg twice daily (discussed with nephrology) Echocardiogram done 10/16/2023 showed LVEF of 75%.  Moderate concentric LVH.  G2 DD -repeat limited echo    CKD stage V -renal consulted and will see -if unable to diurese with IV Lasix  or Cr worsens, will need to start HD (fistula is not  ready yet)  Harlene Bowl DO

## 2024-01-10 NOTE — Plan of Care (Signed)

## 2024-01-10 NOTE — Progress Notes (Signed)
*  PRELIMINARY RESULTS* Echocardiogram Limited 2-D Echocardiogram has been performed.  Teresa Aida PARAS 01/10/2024, 1:15 PM

## 2024-01-10 NOTE — Progress Notes (Signed)
   01/10/24 1315  TOC Brief Assessment  Insurance and Status Reviewed  Patient has primary care physician Yes  Home environment has been reviewed Single family home  Prior level of function: Independent  Prior/Current Home Services No current home services (On 2L O2 at baseline provided by [employer])  Social Drivers of Health Review SDOH reviewed no interventions necessary  Readmission risk has been reviewed Yes  Transition of care needs transition of care needs identified, TOC will continue to follow

## 2024-01-11 ENCOUNTER — Encounter (HOSPITAL_COMMUNITY): Payer: Self-pay | Admitting: Internal Medicine

## 2024-01-11 DIAGNOSIS — I482 Chronic atrial fibrillation, unspecified: Secondary | ICD-10-CM | POA: Diagnosis not present

## 2024-01-11 DIAGNOSIS — F419 Anxiety disorder, unspecified: Secondary | ICD-10-CM | POA: Diagnosis not present

## 2024-01-11 DIAGNOSIS — I5033 Acute on chronic diastolic (congestive) heart failure: Secondary | ICD-10-CM | POA: Diagnosis not present

## 2024-01-11 DIAGNOSIS — J9621 Acute and chronic respiratory failure with hypoxia: Secondary | ICD-10-CM | POA: Diagnosis not present

## 2024-01-11 LAB — GLUCOSE, CAPILLARY
Glucose-Capillary: 91 mg/dL (ref 70–99)
Glucose-Capillary: 171 mg/dL — ABNORMAL HIGH (ref 70–99)
Glucose-Capillary: 151 mg/dL — ABNORMAL HIGH (ref 70–99)
Glucose-Capillary: 126 mg/dL — ABNORMAL HIGH (ref 70–99)

## 2024-01-11 LAB — BASIC METABOLIC PANEL WITH GFR
Anion gap: 12 (ref 5–15)
BUN: 72 mg/dL — ABNORMAL HIGH (ref 6–20)
CO2: 23 mmol/L (ref 22–32)
Calcium: 8.4 mg/dL — ABNORMAL LOW (ref 8.9–10.3)
Chloride: 103 mmol/L (ref 98–111)
Creatinine, Ser: 6.75 mg/dL — ABNORMAL HIGH (ref 0.44–1.00)
GFR, Estimated: 7 mL/min — ABNORMAL LOW (ref >60)
Glucose, Bld: 86 mg/dL (ref 70–99)
Potassium: 4 mmol/L (ref 3.5–5.1)
Sodium: 138 mmol/L (ref 135–145)

## 2024-01-11 LAB — CBC
HCT: 22.2 % — ABNORMAL LOW (ref 36.0–46.0)
Hemoglobin: 6.7 g/dL — CL (ref 12.0–15.0)
MCH: 26 pg (ref 26.0–34.0)
MCHC: 30.2 g/dL (ref 30.0–36.0)
MCV: 86 fL (ref 80.0–100.0)
Platelets: 259 K/uL (ref 150–400)
RBC: 2.58 MIL/uL — ABNORMAL LOW (ref 3.87–5.11)
RDW: 16.6 % — ABNORMAL HIGH (ref 11.5–15.5)
WBC: 6.6 K/uL (ref 4.0–10.5)
nRBC: 0 % (ref 0.0–0.2)

## 2024-01-11 LAB — PREPARE RBC (CROSSMATCH)

## 2024-01-11 LAB — HEMOGLOBIN A1C
Hgb A1c MFr Bld: 5.8 % — ABNORMAL HIGH (ref 4.8–5.6)
Mean Plasma Glucose: 119.76 mg/dL

## 2024-01-11 LAB — HEMOGLOBIN AND HEMATOCRIT, BLOOD
HCT: 22.2 % — ABNORMAL LOW (ref 36.0–46.0)
Hemoglobin: 6.7 g/dL — CL (ref 12.0–15.0)

## 2024-01-11 MED ORDER — METOLAZONE 5 MG PO TABS
5.0000 mg | ORAL_TABLET | Freq: Once | ORAL | Status: AC
  Administered 2024-01-11: 5 mg via ORAL
  Filled 2024-01-11: qty 1

## 2024-01-11 MED ORDER — SODIUM CHLORIDE 0.9% IV SOLUTION: Freq: Once | INTRAVENOUS | Status: AC

## 2024-01-11 NOTE — Progress Notes (Addendum)
 PROGRESS NOTE   Melissa [name]  FMW:[id number] DOB: [birth year] DOA: 01/09/2024 PCP: Melissa Silvio BROCKS, FNP   Chief Complaint  Patient presents with   Shortness of Breath   Level of care: Telemetry  Brief Admission History:  48 y.o. 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  Patient complained that shortness of breath progressively worsened throughout the day yesterday and this worsened even when she takes a few steps.  EMS was activated and on arrival of EMS team O2 sat was in the 70s on 2 LPM of oxygen.  This was increased to 6 LPM with O2 sats improving to 93%.  She denied chest pain.  Patient was taken to the ED for further evaluation and management.   She was recently admitted from 6/7 to 6/9 due to acute on chronic diastolic heart failure in the setting of CKD stage V and she was treated with IV Lasix  from 20 mg every 8 hours.  Nephrologist Dr. Marlee was consulted at that time.  She is again admitted with volume overload, stage V CKD, Acute HFpEF, and acute respiratory distress.     Assessment and Plan:  Acute on chronic HFpEF Massive Volume Overload  Elevated BNP Continue total input/output, daily weights and fluid restriction Continue IV Lasix  120 mg TID as this is working to help her diurese without HD Weight coming down nicely with diuresis Continue heart healthy/modified carb diet Echocardiogram done 10/16/2023 showed LVEF of 75%.  Moderate concentric LVH.  G2 DD.     Intake/Output Summary (Last 24 hours) at 01/11/2024 1654 Last data filed at 01/11/2024 1511 Gross per 24 hour  Intake 954.32 ml  Output 2200 ml  Net -1245.68 ml   Filed Weights   01/09/24 2312 01/10/24 1126 01/11/24 0422  Weight: (!) 137.9 kg (!)  137.2 kg 132.4 kg   Elevated troponin possibly secondary to type II demand ischemia Troponin 25 > 33, she denies chest pain EKG showed no acute changes Continue to trend troponin  Normocytic Anemia Anemia of CKD  --Hg continue to trend down --checking stool for hemoccult positivity --Hg down to 6.7 --transfuse 2 units PRBC, with goal Hg>8 --continue diuresis with IV lasix  120 mg TID   Acute on chronic respiratory failure with hypoxia Wean oxygen as able back to room air    Type 2 diabetes mellitus with hyperglycemia, controlled with renal complications Continue ISS and hypoglycemia Hemoglobin A1c 5.7% CBG (last 3)  Recent Labs    01/11/24 0723 01/11/24 1108 01/11/24 1608  GLUCAP 91 171* 151*     Hypoalbuminemia Albumin 2.6; protein supplement will be provided   Atrial Fibrillation, chronic Continue Metoprolol  Succinate 100 mg po daily  Continue with apixaban  for anticoagulation    Essential hypertension Continue with home meds   Mixed hyperlipidemia Continue with Lipitor   Depression and Anxiety Continue with citalopram    GERD -Continue with Protonix    Morbid Obesity Body mass index is 50.59 kg/m. Diet and lifestyle modification Patient may need to follow-up with outpatient PCP for weight loss program   DVT prophylaxis: apixaban  Code Status: Full  Family Communication:  Disposition:   Consultants:  Nephrology  Procedures:   Antimicrobials:    Subjective: Pt says she is SOB but breathing better than yesterday.  Objective: Vitals:   01/10/24 2302 01/11/24 0422 01/11/24 0816 01/11/24 1242  BP: (!) 137/91 (!) 151/84 (!) 164/96 (!) 140/75  Pulse: 79 85 89 78  Resp: 20 16  18   Temp: 98.5 F (36.9 C) 98.9 F (37.2 C)  98.3 F (36.8 C)  TempSrc: Oral Oral  Oral  SpO2: 94% 99%  97%  Weight:  132.4 kg    Height:        Intake/Output Summary (Last 24 hours) at 01/11/2024 1649 Last data filed at 01/11/2024 1511 Gross per 24 hour  Intake  954.32 ml  Output 2200 ml  Net -1245.68 ml   Filed Weights   01/09/24 2312 01/10/24 1126 01/11/24 0422  Weight: (!) 137.9 kg (!) 137.2 kg 132.4 kg   Examination:  General exam: Appears calm and comfortable appears massively volume overloaded Respiratory system: Clear to auscultation. Respiratory effort normal. Cardiovascular system: normal S1 & S2 heard. No JVD, murmurs, rubs, gallops or clicks. 1+ pedal edema. Gastrointestinal system: Abdomen is nondistended, soft and nontender. No organomegaly or masses felt. Normal bowel sounds heard. Central nervous system: Alert and oriented. No focal neurological deficits. Extremities: 1-2+ edema.  Skin: No rashes, lesions or ulcers. Psychiatry: Judgement and insight appear normal. Mood & affect appropriate.   Data Reviewed: I have personally reviewed following labs and imaging studies  CBC: Recent Labs  Lab 01/09/24 2355 01/11/24 0411 01/11/24 0633  WBC 8.9 6.6  --   HGB 7.2* 6.7* 6.7*  HCT 24.0* 22.2* 22.2*  MCV 85.7 86.0  --   PLT 267 259  --     Basic Metabolic Panel: Recent Labs  Lab 01/09/24 2355 01/10/24 0754 01/11/24 0411  NA 139  --  138  K 4.2  --  4.0  CL 103  --  103  CO2 20*  --  23  GLUCOSE 188*  --  86  BUN 67*  --  72*  CREATININE 6.56*  --  6.75*  CALCIUM  8.5*  --  8.4*  PHOS  --  4.9*  --     CBG: Recent Labs  Lab 01/10/24 1647 01/10/24 2016 01/11/24 0723 01/11/24 1108 01/11/24 1608  GLUCAP 122* 165* 91 171* 151*    No results found for this or any previous visit (from the past 240 hours).   Radiology Studies: ECHOCARDIOGRAM LIMITED Result Date: 01/10/2024    ECHOCARDIOGRAM LIMITED REPORT   Patient Name:   Melissa [name] Date of Exam: 01/10/2024 Medical Rec #:  [id number]      Height:       65.0 in Accession #:    [id number]     Weight:       302.5 lb Date of Birth:  [birth year]      BSA:          2.358 m Patient Age:    47 years       BP:           155/80 mmHg Patient Gender: F              HR:            88 bpm. Exam Location:  [facility] Procedure: Limited Echo and Strain Analysis (Both Spectral and Color Flow            Doppler were utilized during procedure). Indications:    CHF- Acute Diastolic l50.31  History:        Patient has prior history of Echocardiogram examinations, most  recent 09/27/2023. CHF, Arrythmias:Atrial Fibrillation; Risk                 Factors:Hypertension, Diabetes and Dyslipidemia. Acute on                 chronic diastolic (congestive) heart failure (HCC).  Sonographer:    Aida Pizza RCS Referring Phys: [id number] OLADAPO ADEFESO  Sonographer Comments: Global longitudinal strain was attempted. IMPRESSIONS  1. Left ventricular ejection fraction, by estimation, is >75%. The left ventricle has hyperdynamic function. There is severe left ventricular hypertrophy.  2. Right ventricular systolic function is normal. The right ventricular size is normal.  3. The inferior vena cava is normal in size with greater than 50% respiratory variability, suggesting right atrial pressure of 3 mmHg.  4. Limited echo evaluate LV function FINDINGS  Left Ventricle: Left ventricular ejection fraction, by estimation, is >75%. The left ventricle has hyperdynamic function. Strain was performed and the global longitudinal strain is indeterminate. There is severe left ventricular hypertrophy. Right Ventricle: The right ventricular size is normal. Right vetricular wall thickness was not well visualized. Right ventricular systolic function is normal. Venous: The inferior vena cava is normal in size with greater than 50% respiratory variability, suggesting right atrial pressure of 3 mmHg. LEFT VENTRICLE PLAX 2D LVIDd:         4.50 cm LVIDs:         2.10 cm LV PW:         1.70 cm LV IVS:        1.60 cm LVOT diam:     2.00 cm LVOT Area:     3.14 cm  LV Volumes (MOD) LV vol d, MOD A2C: 177.0 ml LV vol d, MOD A4C: 153.0 ml LV vol s, MOD A2C: 73.1 ml LV vol s, MOD A4C: 63.6 ml LV SV MOD A2C:     103.9 ml  LV SV MOD A4C:     153.0 ml LV SV MOD BP:      101.6 ml RIGHT VENTRICLE TAPSE (M-mode): 2.5 cm LEFT ATRIUM         Index LA diam:    4.80 cm 2.04 cm/m   AORTA Ao Root diam: 3.00 cm  SHUNTS Systemic Diam: 2.00 cm Dorn Ross MD Electronically signed by Dorn Ross MD Signature Date/Time: 01/10/2024/4:36:37 PM    Final    DG Chest Port 1 View Result Date: 01/09/2024 CLINICAL DATA:  Shortness of breath EXAM: PORTABLE CHEST 1 VIEW COMPARISON:  chest x-ray 12/03/2023 FINDINGS: The heart is enlarged. There central pulmonary vascular congestion. There are mild strandy and patchy opacities in the left mid and lower lung. There is no pleural effusion or pneumothorax. The heart is enlarged, unchanged. No pneumothorax or acute fracture. IMPRESSION: 1. Cardiomegaly with central pulmonary vascular congestion. 2. Mild strandy and patchy opacities in the left mid and lower lung, which may represent edema or atelectasis. Electronically Signed   By: Greig Pique M.D.   On: 01/09/2024 23:42    Scheduled Meds:  sodium chloride    Intravenous Once   apixaban   5 mg Oral BID   atorvastatin   80 mg Oral Daily   calcitRIOL   0.5 mcg Oral Daily   citalopram   20 mg Oral Daily   feeding supplement (GLUCERNA SHAKE)  237 mL Oral TID BM   ferrous sulfate   325 mg Oral Daily   insulin  aspart  0-6 Units Subcutaneous TID WC   metoprolol  succinate  100 mg Oral Daily   pantoprazole   40  mg Oral Daily   Continuous Infusions:  furosemide  Stopped (01/11/24 1446)   iron  sucrose 78.6 mL/hr at 01/11/24 1511    LOS: 1 day   Time spent: 58 mins  Kayra Crowell Vicci, MD How to contact the [facility] Attending or Consulting provider 7A - 7P or covering provider during after hours 7P -7A, for this patient?  Check the care team in [facility] and look for a) attending/consulting [facility] provider listed and b) the [facility] team listed Log into [email or web address] to find provider on call.  Locate the [facility] provider you are looking for under [facility] and  page to a number that you can be directly reached. If you still have difficulty reaching the provider, please page the [facility] (Director on Call) for the Hospitalists listed on amion for assistance.  01/11/2024, 4:49 PM

## 2024-01-11 NOTE — Plan of Care (Signed)

## 2024-01-11 NOTE — Progress Notes (Signed)
 Nephrology Follow-Up Consult note   Assessment/Recommendations: Melissa [name] is a/an 48 y.o. female with a past medical history significant for past medical history of CKD 5, HTN, atrial fibrillation, HLD, obesity who presents with volume overload and acute hypoxic respiratory failure   CKD V: Mild uremic symptoms but mostly volume overloaded causing her symptoms.  She is very near starting dialysis. Fortunately responding to diuretics at this time.  Goal is to improve volume overloaded and start dialysis once her fistula is ready to use in about 3-4 weeks -Diuresis as below -Continue to monitor for worsening uremic symptoms -Consider dialysis catheter placement if she fails to improve further -Continue to monitor daily Cr, Dose meds for GFR -Monitor Daily I/Os, Daily weight  -Maintain MAP>65 for optimal renal perfusion.  -Avoid nephrotoxic medications including NSAIDs -Use synthetic opioids (Fentanyl /Dilaudid ) if needed   Acute on chronic hypoxic respiratory failure: Secondary to volume overload despite torsemide  80 mg twice daily at home. Good response to IV Lasix  120 mg 3 times a day as well as metolazone  5 mg.  Repeat dosing today.  Continue diuresis as tolerated   Anemia: CKD and iron  deficiency.  Consider evaluation for blood loss per primary team.  Transfusion today per primary team.  Start ESA tomorrow once iron  is fully replete   Secondary hyperparathyroidism: Continue home calcitriol .  Phos at goal   Uncontrolled Diabetes Mellitus Type 2 with Hyperglycemia: Management per primary team   Hypertension: Continue home medications and diuresis as above   Recommendations conveyed to primary service.    Jayson JINNY Player [facility] 01/11/2024 10:05 AM  ___________________________________________________________  CC: shortness of breath  Interval History/Subjective: patient with excellent urine output over the past 24 hours.  4.6 L made.  Feels much better  today with improved respirations.  Denies significant nausea or changes to her appetite.  Hemoglobin low today.   Medications:  Current Facility-Administered Medications  Medication Dose Route Frequency Provider Last Rate Last Admin   apixaban  (ELIQUIS ) tablet 5 mg  5 mg Oral BID Adefeso, Oladapo, DO   5 mg at 01/11/24 0817   atorvastatin  (LIPITOR) tablet 80 mg  80 mg Oral Daily Adefeso, Oladapo, DO   80 mg at 01/11/24 9182   calcitRIOL  (ROCALTROL ) capsule 0.5 mcg  0.5 mcg Oral Daily Josey Forcier J, MD   0.5 mcg at 01/11/24 9182   citalopram  (CELEXA ) tablet 20 mg  20 mg Oral Daily Adefeso, Oladapo, DO   20 mg at 01/11/24 0817   feeding supplement (GLUCERNA SHAKE) (GLUCERNA SHAKE) liquid 237 mL  237 mL Oral TID BM Adefeso, Oladapo, DO       ferrous sulfate  tablet 325 mg  325 mg Oral Daily Adefeso, Oladapo, DO   325 mg at 01/11/24 0817   furosemide  (LASIX ) 120 mg in dextrose  5 % 50 mL IVPB  120 mg Intravenous TID Kamirah Shugrue J, MD 62 mL/hr at 01/11/24 0826 120 mg at 01/11/24 0826   insulin  aspart (novoLOG ) injection 0-6 Units  0-6 Units Subcutaneous TID WC Adefeso, Oladapo, DO       iron  sucrose (VENOFER ) 500 mg in sodium chloride  0.9 % 250 mL IVPB  500 mg Intravenous Daily Aamina Skiff J, MD   Stopped at 01/10/24 2012   metoprolol  succinate (TOPROL -XL) 24 hr tablet 100 mg  100 mg Oral Daily Adefeso, Oladapo, DO   100 mg at 01/11/24 0817   pantoprazole  (PROTONIX ) EC tablet 40 mg  40 mg Oral Daily Adefeso, Oladapo, DO   40  mg at 01/11/24 0817      Review of Systems: 10 systems reviewed and negative except per interval history/subjective  Physical Exam: Vitals:   01/11/24 0422 01/11/24 0816  BP: (!) 151/84 (!) 164/96  Pulse: 85 89  Resp: 16   Temp: 98.9 F (37.2 C)   SpO2: 99%    No intake/output data recorded.  Intake/Output Summary (Last 24 hours) at 01/11/2024 1005 Last data filed at 01/11/2024 0600 Gross per 24 hour  Intake 847.55 ml  Output 4550 ml  Net -3702.45 ml    Constitutional: obese, well-appearing, no acute distress ENMT: ears and nose without scars or lesions, MMM CV: normal rate, no edema Respiratory: bilateral chest rise, normal work of breathing Gastrointestinal: soft, non-tender, no palpable masses or hernias Skin: no visible lesions or rashes Psych: alert, judgement/insight appropriate, appropriate mood and affect   Test Results I personally reviewed new and old clinical labs and radiology tests Lab Results  Component Value Date   NA 138 01/11/2024   K 4.0 01/11/2024   CL 103 01/11/2024   CO2 23 01/11/2024   BUN 72 (H) 01/11/2024   CREATININE 6.75 (H) 01/11/2024   CALCIUM  8.4 (L) 01/11/2024   ALBUMIN 2.6 (L) 01/09/2024   PHOS 4.9 (H) 01/10/2024    CBC Recent Labs  Lab 01/09/24 2355 01/11/24 0411 01/11/24 0633  WBC 8.9 6.6  --   HGB 7.2* 6.7* 6.7*  HCT 24.0* 22.2* 22.2*  MCV 85.7 86.0  --   PLT 267 259  --

## 2024-01-11 NOTE — Hospital Course (Addendum)
 47 y.o. 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  Patient complained that shortness of breath progressively worsened throughout the day yesterday and this worsened even when she takes a few steps.  EMS was activated and on arrival of EMS team O2 sat was in the 70s on 2 LPM of oxygen.  This was increased to 6 LPM with O2 sats improving to 93%.  She denied chest pain.  Patient was taken to the ED for further evaluation and management.   She was recently admitted from 6/7 to 6/9 due to acute on chronic diastolic heart failure in the setting of CKD stage V and she was treated with IV Lasix  from 20 mg every 8 hours.  Nephrologist Dr. Marlee was consulted at that time.  She is again admitted with volume overload, stage V CKD, Acute HFpEF, and acute respiratory distress.

## 2024-01-12 ENCOUNTER — Encounter (HOSPITAL_COMMUNITY): Payer: Self-pay | Admitting: Internal Medicine

## 2024-01-12 DIAGNOSIS — I5033 Acute on chronic diastolic (congestive) heart failure: Secondary | ICD-10-CM | POA: Diagnosis not present

## 2024-01-12 DIAGNOSIS — J9621 Acute and chronic respiratory failure with hypoxia: Secondary | ICD-10-CM | POA: Diagnosis not present

## 2024-01-12 DIAGNOSIS — F419 Anxiety disorder, unspecified: Secondary | ICD-10-CM | POA: Diagnosis not present

## 2024-01-12 DIAGNOSIS — I482 Chronic atrial fibrillation, unspecified: Secondary | ICD-10-CM | POA: Diagnosis not present

## 2024-01-12 LAB — CBC
HCT: 26.7 % — ABNORMAL LOW (ref 36.0–46.0)
Hemoglobin: 8.5 g/dL — ABNORMAL LOW (ref 12.0–15.0)
MCH: 27.4 pg (ref 26.0–34.0)
MCHC: 31.8 g/dL (ref 30.0–36.0)
MCV: 86.1 fL (ref 80.0–100.0)
Platelets: 250 K/uL (ref 150–400)
RBC: 3.1 MIL/uL — ABNORMAL LOW (ref 3.87–5.11)
RDW: 16.2 % — ABNORMAL HIGH (ref 11.5–15.5)
WBC: 8.5 K/uL (ref 4.0–10.5)
nRBC: 0.2 % (ref 0.0–0.2)

## 2024-01-12 LAB — BPAM RBC
Blood Product Expiration Date: 202508092359
Blood Product Expiration Date: 202508092359
ISSUE DATE / TIME: 202507161831
ISSUE DATE / TIME: 202507162135
Unit Type and Rh: 6200
Unit Type and Rh: 6200

## 2024-01-12 LAB — TYPE AND SCREEN
ABO/RH(D): A POS
Antibody Screen: NEGATIVE
Unit division: 0
Unit division: 0

## 2024-01-12 LAB — RENAL FUNCTION PANEL
Albumin: 2.5 g/dL — ABNORMAL LOW (ref 3.5–5.0)
Anion gap: 9 (ref 5–15)
BUN: 67 mg/dL — ABNORMAL HIGH (ref 6–20)
CO2: 25 mmol/L (ref 22–32)
Calcium: 8.6 mg/dL — ABNORMAL LOW (ref 8.9–10.3)
Chloride: 101 mmol/L (ref 98–111)
Creatinine, Ser: 7.12 mg/dL — ABNORMAL HIGH (ref 0.44–1.00)
GFR, Estimated: 7 mL/min — ABNORMAL LOW (ref >60)
Glucose, Bld: 95 mg/dL (ref 70–99)
Phosphorus: 5.5 mg/dL — ABNORMAL HIGH (ref 2.5–4.6)
Potassium: 4 mmol/L (ref 3.5–5.1)
Sodium: 135 mmol/L (ref 135–145)

## 2024-01-12 LAB — GLUCOSE, CAPILLARY
Glucose-Capillary: 95 mg/dL (ref 70–99)
Glucose-Capillary: 222 mg/dL — ABNORMAL HIGH (ref 70–99)
Glucose-Capillary: 100 mg/dL — ABNORMAL HIGH (ref 70–99)
Glucose-Capillary: 135 mg/dL — ABNORMAL HIGH (ref 70–99)

## 2024-01-12 LAB — MAGNESIUM: Magnesium: 1.9 mg/dL (ref 1.7–2.4)

## 2024-01-12 MED ORDER — TORSEMIDE 20 MG PO TABS
100.0000 mg | ORAL_TABLET | Freq: Two times a day (BID) | ORAL | Status: DC
  Administered 2024-01-12 – 2024-01-13 (×2): 100 mg via ORAL
  Filled 2024-01-12 (×2): qty 5

## 2024-01-12 MED ORDER — METOLAZONE 5 MG PO TABS
5.0000 mg | ORAL_TABLET | Freq: Once | ORAL | Status: AC
  Administered 2024-01-12: 5 mg via ORAL
  Filled 2024-01-12: qty 1

## 2024-01-12 MED ORDER — HYDRALAZINE HCL 25 MG PO TABS: 25.0000 mg | ORAL_TABLET | Freq: Four times a day (QID) | ORAL | Status: DC | PRN

## 2024-01-12 MED ORDER — TORSEMIDE 20 MG PO TABS
100.0000 mg | ORAL_TABLET | Freq: Once | ORAL | Status: AC
  Administered 2024-01-12: 100 mg via ORAL
  Filled 2024-01-12: qty 5

## 2024-01-12 MED ORDER — DARBEPOETIN ALFA 100 MCG/0.5ML IJ SOSY
100.0000 ug | PREFILLED_SYRINGE | Freq: Once | INTRAMUSCULAR | Status: DC
  Filled 2024-01-12 (×2): qty 0.5

## 2024-01-12 NOTE — Plan of Care (Signed)

## 2024-01-12 NOTE — Progress Notes (Signed)
 Nephrology Follow-Up Consult note   Assessment/Recommendations: Melissa [name] is a/an 48 y.o. female with a past medical history significant for past medical history of CKD 5, HTN, atrial fibrillation, HLD, obesity who presents with volume overload and acute hypoxic respiratory failure   CKD V: Mild uremic symptoms but mostly volume overloaded causing her symptoms.  She is very near starting dialysis. Fortunately responding to diuretics at this time.  Goal is to improve volume overloaded and start dialysis once her fistula is ready to use in about 3-4 weeks -Diuresis as below -Continue to monitor for worsening uremic symptoms -Consider dialysis catheter placement if she fails to improve further -Continue to monitor daily Cr, Dose meds for GFR -Monitor Daily I/Os, Daily weight  -Maintain MAP>65 for optimal renal perfusion.  -Avoid nephrotoxic medications including NSAIDs -Use synthetic opioids (Fentanyl /Dilaudid ) if needed   Acute on chronic hypoxic respiratory failure: Secondary to volume overload despite torsemide  80 mg twice daily at home.  Good response to IV Lasix  and metolazone .  Volume status more improved and symptomatically improved.  Will switch to oral torsemide  100 mg twice daily with metolazone  5 mg.  If she continues to respond to this plan would likely be discharged tomorrow on torsemide  100 mg twice daily and metolazone  5 mg 3 times weekly.   Anemia: CKD and iron  deficiency.  Consider evaluation for blood loss per primary team.  Received 1 unit of packed red blood cells on 7/16; also received 1 g of IV iron  this admission.  Give Aranesp  100 mcg today.   Secondary hyperparathyroidism: Continue home calcitriol .  Phos at goal   Uncontrolled Diabetes Mellitus Type 2 with Hyperglycemia: Management per primary team   Hypertension: Continue home medications and diuresis as above   Recommendations conveyed to primary service.    Jayson JINNY Player [facility] 01/12/2024 9:07 AM  ___________________________________________________________  CC: shortness of breath  Interval History/Subjective: Patient states she continues to feel fairly well.  Feels like her breathing is back to where it was 2 weeks ago.  Good urine output yesterday.  Creatinine slightly increased as expected.  No nausea, vomiting.   Medications:  Current Facility-Administered Medications  Medication Dose Route Frequency Provider Last Rate Last Admin   apixaban  (ELIQUIS ) tablet 5 mg  5 mg Oral BID Adefeso, Oladapo, DO   5 mg at 01/11/24 2252   atorvastatin  (LIPITOR) tablet 80 mg  80 mg Oral Daily Adefeso, Oladapo, DO   80 mg at 01/11/24 0817   calcitRIOL  (ROCALTROL ) capsule 0.5 mcg  0.5 mcg Oral Daily Gyanna Jarema J, MD   0.5 mcg at 01/11/24 9182   citalopram  (CELEXA ) tablet 20 mg  20 mg Oral Daily Adefeso, Oladapo, DO   20 mg at 01/11/24 9182   Darbepoetin Alfa  (ARANESP ) injection 100 mcg  100 mcg Subcutaneous Once Malikai Gut J, MD       feeding supplement (GLUCERNA SHAKE) (GLUCERNA SHAKE) liquid 237 mL  237 mL Oral TID BM Adefeso, Oladapo, DO       ferrous sulfate  tablet 325 mg  325 mg Oral Daily Adefeso, Oladapo, DO   325 mg at 01/11/24 9182   insulin  aspart (novoLOG ) injection 0-6 Units  0-6 Units Subcutaneous TID WC Adefeso, Oladapo, DO   1 Units at 01/11/24 1712   metolazone  (ZAROXOLYN ) tablet 5 mg  5 mg Oral Once Wanda Cellucci J, MD       metoprolol  succinate (TOPROL -XL) 24 hr tablet 100 mg  100 mg Oral Daily Adefeso, Oladapo, DO  100 mg at 01/11/24 0817   pantoprazole  (PROTONIX ) EC tablet 40 mg  40 mg Oral Daily Adefeso, Oladapo, DO   40 mg at 01/11/24 0817   torsemide  (DEMADEX ) tablet 100 mg  100 mg Oral BID Cortlynn Hollinsworth J, MD       torsemide  (DEMADEX ) tablet 100 mg  100 mg Oral Once Macel Jayson PARAS, MD          Review of Systems: 10 systems reviewed and negative except per interval history/subjective  Physical Exam: Vitals:   01/11/24  2333 01/12/24 0420  BP: (!) 173/82 (!) 160/81  Pulse: 82 79  Resp: 20 18  Temp: 99.1 F (37.3 C) 99.1 F (37.3 C)  SpO2:  98%   No intake/output data recorded.  Intake/Output Summary (Last 24 hours) at 01/12/2024 9092 Last data filed at 01/12/2024 0500 Gross per 24 hour  Intake 1658.43 ml  Output 3075 ml  Net -1416.57 ml   Constitutional: obese, well-appearing, no acute distress ENMT: ears and nose without scars or lesions, MMM CV: normal rate, no edema Respiratory: bilateral chest rise, normal work of breathing Gastrointestinal: soft, non-tender, no palpable masses or hernias Skin: no visible lesions or rashes Psych: alert, judgement/insight appropriate, appropriate mood and affect   Test Results I personally reviewed new and old clinical labs and radiology tests Lab Results  Component Value Date   NA 135 01/12/2024   K 4.0 01/12/2024   CL 101 01/12/2024   CO2 25 01/12/2024   BUN 67 (H) 01/12/2024   CREATININE 7.12 (H) 01/12/2024   CALCIUM  8.6 (L) 01/12/2024   ALBUMIN 2.5 (L) 01/12/2024   PHOS 5.5 (H) 01/12/2024    CBC Recent Labs  Lab 01/09/24 2355 01/11/24 0411 01/11/24 0633 01/12/24 0412  WBC 8.9 6.6  --  8.5  HGB 7.2* 6.7* 6.7* 8.5*  HCT 24.0* 22.2* 22.2* 26.7*  MCV 85.7 86.0  --  86.1  PLT 267 259  --  250

## 2024-01-12 NOTE — Progress Notes (Signed)
 PROGRESS NOTE   Melissa [name]  FMW:[id number] DOB: 1975/09/17 DOA: 01/09/2024 PCP: Alston Silvio BROCKS, FNP   Chief Complaint  Patient presents with   Shortness of Breath   Level of care: Telemetry  Brief Admission History:  48 y.o. 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  Patient complained that shortness of breath progressively worsened throughout the day yesterday and this worsened even when she takes a few steps.  EMS was activated and on arrival of EMS team O2 sat was in the 70s on 2 LPM of oxygen.  This was increased to 6 LPM with O2 sats improving to 93%.  She denied chest pain.  Patient was taken to the ED for further evaluation and management.   She was recently admitted from 6/7 to 6/9 due to acute on chronic diastolic heart failure in the setting of CKD stage V and she was treated with IV Lasix  from 20 mg every 8 hours.  Nephrologist Dr. Marlee was consulted at that time.  She is again admitted with volume overload, stage V CKD, Acute HFpEF, and acute respiratory distress.     Assessment and Plan:  Acute on chronic HFpEF Massive Volume Overload  Elevated BNP Continue total input/output, daily weights and fluid restriction Treated with IV Lasix  120 mg TID and now transitioned by nephrologist to torsemide  100 mg BID and metolazone  5 mg three times weekly Weight coming down nicely with diuresis Continue heart healthy/modified carb diet Echocardiogram done 10/16/2023 showed LVEF of 75%.  Moderate concentric LVH.  G2 DD.     Intake/Output Summary (Last 24 hours) at 01/12/2024 1724 Last data filed at 01/12/2024 0500 Gross per 24 hour  Intake 1524.57 ml  Output 1375 ml  Net 149.57 ml   Filed Weights   01/09/24 2312  01/10/24 1126 01/11/24 0422  Weight: (!) 137.9 kg (!) 137.2 kg 132.4 kg   Elevated troponin secondary to type II demand ischemia Troponin 25 > 33, she denies chest pain EKG showed no acute changes  Normocytic Anemia Anemia of CKD  --Hg continue to trend down --checking stool for hemoccult positivity --Hg down to 6.7 on 7/16 --transfused 2 units PRBC, Hg up to 8.5 on 7/16   Acute on chronic respiratory failure with hypoxia Wean oxygen as able back to room air    Type 2 diabetes mellitus with hyperglycemia, controlled with renal complications Continue ISS and hypoglycemia Hemoglobin A1c 5.7% CBG (last 3)  Recent Labs    01/12/24 0728 01/12/24 1125 01/12/24 1558  GLUCAP 95 222* 100*     Hypoalbuminemia Albumin 2.6; protein supplement will be provided   Atrial Fibrillation, chronic Continue Metoprolol  Succinate 100 mg po daily  Continue with apixaban  for anticoagulation    Essential hypertension Continue with home meds   Mixed hyperlipidemia Continue with Lipitor   Depression and Anxiety Continue with citalopram    GERD -Continue with Protonix    Morbid Obesity Body mass index is 50.59 kg/m. Diet and lifestyle modification Patient may need to follow-up with outpatient PCP for weight loss program   DVT prophylaxis: apixaban  Code Status: Full  Family Communication:  Disposition: anticipate home tomorrow if cleared by nephrology team   Consultants:  Nephrology  Procedures:   Antimicrobials:    Subjective: Pt says she  is SOB but breathing better than yesterday.   Objective: Vitals:   01/11/24 2200 01/11/24 2333 01/12/24 0420 01/12/24 1241  BP: (!) 163/85 (!) 173/82 (!) 160/81 (!) 146/77  Pulse: 80 82 79 69  Resp: 16 20 18 18   Temp: 98.4 F (36.9 C) 99.1 F (37.3 C) 99.1 F (37.3 C) 98.3 F (36.8 C)  TempSrc: Oral Oral Oral Oral  SpO2: 96%  98% 96%  Weight:      Height:        Intake/Output Summary (Last 24 hours) at 01/12/2024 1724 Last data  filed at 01/12/2024 0500 Gross per 24 hour  Intake 1524.57 ml  Output 1375 ml  Net 149.57 ml   Filed Weights   01/09/24 2312 01/10/24 1126 01/11/24 0422  Weight: (!) 137.9 kg (!) 137.2 kg 132.4 kg   Examination:  General exam: Appears calm and comfortable appears massively volume overloaded Respiratory system: Clear to auscultation. Respiratory effort normal. Cardiovascular system: normal S1 & S2 heard. No JVD, murmurs, rubs, gallops or clicks. 1+ pedal edema. Gastrointestinal system: Abdomen is nondistended, soft and nontender. No organomegaly or masses felt. Normal bowel sounds heard. Central nervous system: Alert and oriented. No focal neurological deficits. Extremities: 1-2+ edema.  Skin: No rashes, lesions or ulcers. Psychiatry: Judgement and insight appear normal. Mood & affect appropriate.   Data Reviewed: I have personally reviewed following labs and imaging studies  CBC: Recent Labs  Lab 01/09/24 2355 01/11/24 0411 01/11/24 0633 01/12/24 0412  WBC 8.9 6.6  --  8.5  HGB 7.2* 6.7* 6.7* 8.5*  HCT 24.0* 22.2* 22.2* 26.7*  MCV 85.7 86.0  --  86.1  PLT 267 259  --  250    Basic Metabolic Panel: Recent Labs  Lab 01/09/24 2355 01/10/24 0754 01/11/24 0411 01/12/24 0412  NA 139  --  138 135  K 4.2  --  4.0 4.0  CL 103  --  103 101  CO2 20*  --  23 25  GLUCOSE 188*  --  86 95  BUN 67*  --  72* 67*  CREATININE 6.56*  --  6.75* 7.12*  CALCIUM  8.5*  --  8.4* 8.6*  MG  --   --   --  1.9  PHOS  --  4.9*  --  5.5*    CBG: Recent Labs  Lab 01/11/24 1608 01/11/24 1948 01/12/24 0728 01/12/24 1125 01/12/24 1558  GLUCAP 151* 126* 95 222* 100*    No results found for this or any previous visit (from the past 240 hours).   Radiology Studies: No results found.   Scheduled Meds:  apixaban   5 mg Oral BID   atorvastatin   80 mg Oral Daily   calcitRIOL   0.5 mcg Oral Daily   citalopram   20 mg Oral Daily   darbepoetin (ARANESP ) injection - DIALYSIS  100 mcg  Subcutaneous Once   feeding supplement (GLUCERNA SHAKE)  237 mL Oral TID BM   ferrous sulfate   325 mg Oral Daily   insulin  aspart  0-6 Units Subcutaneous TID WC   metoprolol  succinate  100 mg Oral Daily   pantoprazole   40 mg Oral Daily   torsemide   100 mg Oral BID   Continuous Infusions:   LOS: 2 days   Time spent: 55 mins  Pina Sirianni Vicci, MD How to contact the [facility] Attending or Consulting provider 7A - 7P or covering provider during after hours 7P -7A, for this patient?  Check the care team in [facility] and look for a)  attending/consulting [facility] provider listed and b) the [facility] team listed Log into [email or web address] to find provider on call.  Locate the [facility] provider you are looking for under [facility] and page to a number that you can be directly reached. If you still have difficulty reaching the provider, please page the [facility] (Director on Call) for the Hospitalists listed on amion for assistance.  01/12/2024, 5:24 PM

## 2024-01-13 DIAGNOSIS — R7989 Other specified abnormal findings of blood chemistry: Secondary | ICD-10-CM | POA: Diagnosis not present

## 2024-01-13 DIAGNOSIS — J9621 Acute and chronic respiratory failure with hypoxia: Secondary | ICD-10-CM | POA: Diagnosis not present

## 2024-01-13 DIAGNOSIS — I482 Chronic atrial fibrillation, unspecified: Secondary | ICD-10-CM | POA: Diagnosis not present

## 2024-01-13 DIAGNOSIS — I5033 Acute on chronic diastolic (congestive) heart failure: Secondary | ICD-10-CM | POA: Diagnosis not present

## 2024-01-13 LAB — RENAL FUNCTION PANEL
Albumin: 2.4 g/dL — ABNORMAL LOW (ref 3.5–5.0)
Anion gap: 13 (ref 5–15)
BUN: 74 mg/dL — ABNORMAL HIGH (ref 6–20)
CO2: 24 mmol/L (ref 22–32)
Calcium: 8.7 mg/dL — ABNORMAL LOW (ref 8.9–10.3)
Chloride: 100 mmol/L (ref 98–111)
Creatinine, Ser: 7.25 mg/dL — ABNORMAL HIGH (ref 0.44–1.00)
GFR, Estimated: 6 mL/min — ABNORMAL LOW (ref >60)
Glucose, Bld: 86 mg/dL (ref 70–99)
Phosphorus: 5.7 mg/dL — ABNORMAL HIGH (ref 2.5–4.6)
Potassium: 3.9 mmol/L (ref 3.5–5.1)
Sodium: 137 mmol/L (ref 135–145)

## 2024-01-13 LAB — CBC
HCT: 26.8 % — ABNORMAL LOW (ref 36.0–46.0)
Hemoglobin: 8.4 g/dL — ABNORMAL LOW (ref 12.0–15.0)
MCH: 26.9 pg (ref 26.0–34.0)
MCHC: 31.3 g/dL (ref 30.0–36.0)
MCV: 85.9 fL (ref 80.0–100.0)
Platelets: 255 K/uL (ref 150–400)
RBC: 3.12 MIL/uL — ABNORMAL LOW (ref 3.87–5.11)
RDW: 16.3 % — ABNORMAL HIGH (ref 11.5–15.5)
WBC: 7.9 K/uL (ref 4.0–10.5)
nRBC: 0.3 % — ABNORMAL HIGH (ref 0.0–0.2)

## 2024-01-13 LAB — GLUCOSE, CAPILLARY
Glucose-Capillary: 90 mg/dL (ref 70–99)
Glucose-Capillary: 188 mg/dL — ABNORMAL HIGH (ref 70–99)

## 2024-01-13 LAB — MAGNESIUM: Magnesium: 2 mg/dL (ref 1.7–2.4)

## 2024-01-13 MED ORDER — METOLAZONE 5 MG PO TABS
5.0000 mg | ORAL_TABLET | Freq: Once | ORAL | Status: AC
  Administered 2024-01-13: 5 mg via ORAL
  Filled 2024-01-13: qty 1

## 2024-01-13 MED ORDER — TORSEMIDE 100 MG PO TABS
100.0000 mg | ORAL_TABLET | Freq: Two times a day (BID) | ORAL | 0 refills | Status: AC
Start: 2024-01-13 — End: ?

## 2024-01-13 MED ORDER — METOLAZONE 5 MG PO TABS: 5.0000 mg | ORAL_TABLET | ORAL | 0 refills | Status: AC

## 2024-01-13 NOTE — Plan of Care (Signed)
  Problem: Education: Goal: Ability to describe self-care measures that may prevent or decrease complications (Diabetes Survival Skills Education) will improve Outcome: Progressing   Problem: Coping: Goal: Ability to adjust to condition or change in health will improve Outcome: Progressing   Problem: Education: Goal: Knowledge of General Education information will improve Description: Including pain rating scale, medication(s)/side effects and non-pharmacologic comfort measures Outcome: Progressing   Problem: Clinical Measurements: Goal: Respiratory complications will improve Outcome: Progressing

## 2024-01-13 NOTE — Discharge Instructions (Signed)
IMPORTANT INFORMATION: PAY CLOSE ATTENTION   PHYSICIAN DISCHARGE INSTRUCTIONS  Follow with Primary care provider  [place], [place] Dragon, FNP  and other consultants as instructed by your Hospitalist Physician  [place] IF SYMPTOMS COME BACK, WORSEN OR NEW PROBLEM DEVELOPS   Please note: You were cared for by a hospitalist during your hospital stay. Every effort will be made to forward records to your primary care provider.  You can request that your primary care provider send for your hospital records if they have not received them.  Once you are discharged, your primary care physician will handle any further medical issues. Please note that NO REFILLS for any discharge medications will be authorized once you are discharged, as it is imperative that you return to your primary care physician (or establish a relationship with a primary care physician if you do not have one) for your post hospital discharge needs so that they can reassess your need for medications and monitor your lab values.  Please get a complete blood count and chemistry panel checked by your Primary MD at your next visit, and again as instructed by your Primary MD.  Get Medicines reviewed and adjusted: Please take all your medications with you for your next visit with your Primary MD  Laboratory/radiological data: Please request your Primary MD to go over all hospital tests and procedure/radiological results at the follow up, please ask your primary care provider to get all Hospital records sent to his/her office.  In some cases, they will be blood work, cultures and biopsy results pending at the time of your discharge. Please request that your primary care provider follow up on these results.  If you are diabetic, please bring your blood sugar readings with you to your follow up appointment with primary care.    Please call and make your follow up appointments as soon as possible.    Also Note  the following: If you experience worsening of your admission symptoms, develop shortness of breath, life threatening emergency, suicidal or homicidal thoughts you must seek medical attention immediately by calling 911 or calling your MD immediately  if symptoms less severe.  You must read complete instructions/literature along with all the possible adverse reactions/side effects for all the Medicines you take and that have been prescribed to you. Take any new Medicines after you have completely understood and accpet all the possible adverse reactions/side effects.   Do not drive when taking Pain medications or sleeping medications (Benzodiazepines)  Do not take more than prescribed Pain, Sleep and Anxiety Medications. It is not advisable to combine anxiety,sleep and pain medications without talking with your primary care practitioner  Special Instructions: If you have smoked or chewed Tobacco  in the last 2 yrs please stop smoking, stop any regular Alcohol  and or any Recreational drug use.  Wear Seat belts while driving.  Do not drive if taking any narcotic, mind altering or controlled substances or recreational drugs or alcohol.

## 2024-01-13 NOTE — Progress Notes (Addendum)
 Nephrology Follow-Up Consult note   Assessment/Recommendations: Melissa [name] is a/an 48 y.o. female with a past medical history significant for past medical history of CKD 5, HTN, atrial fibrillation, HLD, obesity who presents with volume overload and acute hypoxic respiratory failure   CKD V: Mild uremic symptoms but mostly volume overloaded causing her symptoms.  She is very near starting dialysis. Fortunately responding to diuretics at this time.  Goal is to improve volume overloaded and start dialysis once her fistula is ready to use in about 3-4 weeks -Diuresis as below -Continue to monitor for worsening uremic symptoms -Consider dialysis catheter placement if she fails to improve further -Continue to monitor daily Cr, Dose meds for GFR -Monitor Daily I/Os, Daily weight  -Maintain MAP>65 for optimal renal perfusion.  -Avoid nephrotoxic medications including NSAIDs -Use synthetic opioids (Fentanyl /Dilaudid ) if needed   Acute on chronic hypoxic respiratory failure: Secondary to volume overload despite torsemide  80 mg twice daily at home.  Good response to IV Lasix  and metolazone .  Volume status more improved and symptomatically improved.  Continue torsemide  100 mg twice daily and metolazone  5 mg 3 times weekly.  Can take extra dose of metolazone  if experiencing more volume overloaded in the interim   Anemia: CKD and iron  deficiency.  Consider evaluation for blood loss per primary team.  Received 1 unit of packed red blood cells on 7/16; also received 1 g of IV iron  this admission.  Aranesp  100 mcg given on 7/17   Secondary hyperparathyroidism: Continue home calcitriol .  Phos at goal   Uncontrolled Diabetes Mellitus Type 2 with Hyperglycemia: Management per primary team   Hypertension: Continue home medications and diuresis as above  Patient is stable for discharge from nephrology perspective.  We will arrange labs on Tuesday or Wednesday of next week.  If the patient remains in the  hospital we will not plan to round on her over the weekend unless there is an emergent complication   Recommendations conveyed to primary service.    Jayson JINNY Player [facility] 01/13/2024 10:25 AM  ___________________________________________________________  CC: shortness of breath  Interval History/Subjective: Patient does not feel like she urinated quite as much as the day as before but weight continued to improve and shortness of breath overall improved.   Medications:  Current Facility-Administered Medications  Medication Dose Route Frequency Provider Last Rate Last Admin   apixaban  (ELIQUIS ) tablet 5 mg  5 mg Oral BID Adefeso, Oladapo, DO   5 mg at 01/13/24 0944   atorvastatin  (LIPITOR) tablet 80 mg  80 mg Oral Daily Adefeso, Oladapo, DO   80 mg at 01/13/24 0944   calcitRIOL  (ROCALTROL ) capsule 0.5 mcg  0.5 mcg Oral Daily Chapel Silverthorn J, MD   0.5 mcg at 01/13/24 0945   citalopram  (CELEXA ) tablet 20 mg  20 mg Oral Daily Adefeso, Oladapo, DO   20 mg at 01/13/24 0945   Darbepoetin Alfa  (ARANESP ) injection 100 mcg  100 mcg Subcutaneous Once Demetrius Barrell J, MD       feeding supplement (GLUCERNA SHAKE) (GLUCERNA SHAKE) liquid 237 mL  237 mL Oral TID BM Adefeso, Oladapo, DO       ferrous sulfate  tablet 325 mg  325 mg Oral Daily Adefeso, Oladapo, DO   325 mg at 01/13/24 0945   hydrALAZINE  (APRESOLINE ) tablet 25 mg  25 mg Oral Q6H PRN Opyd, Timothy S, MD       insulin  aspart (novoLOG ) injection 0-6 Units  0-6 Units Subcutaneous TID WC Adefeso, Oladapo, DO  2 Units at 01/12/24 1323   metolazone  (ZAROXOLYN ) tablet 5 mg  5 mg Oral Once Guyla Bless J, MD       metoprolol  succinate (TOPROL -XL) 24 hr tablet 100 mg  100 mg Oral Daily Adefeso, Oladapo, DO   100 mg at 01/13/24 0945   pantoprazole  (PROTONIX ) EC tablet 40 mg  40 mg Oral Daily Adefeso, Oladapo, DO   40 mg at 01/13/24 0944   torsemide  (DEMADEX ) tablet 100 mg  100 mg Oral BID Macel Jayson PARAS, MD   100 mg  at 01/13/24 0945      Review of Systems: 10 systems reviewed and negative except per interval history/subjective  Physical Exam: Vitals:   01/12/24 1944 01/13/24 0623  BP: (!) 179/96 (!) 146/82  Pulse: 77 75  Resp: 18 18  Temp: 98.8 F (37.1 C) 98.7 F (37.1 C)  SpO2: 98% 97%   No intake/output data recorded.  Intake/Output Summary (Last 24 hours) at 01/13/2024 1025 Last data filed at 01/13/2024 9361 Gross per 24 hour  Intake 480 ml  Output 900 ml  Net -420 ml   Constitutional: obese, well-appearing, no acute distress ENMT: ears and nose without scars or lesions, MMM CV: normal rate, no edema Respiratory: bilateral chest rise, normal work of breathing Gastrointestinal: soft, non-tender, no palpable masses or hernias Skin: no visible lesions or rashes Psych: alert, judgement/insight appropriate, appropriate mood and affect   Test Results I personally reviewed new and old clinical labs and radiology tests Lab Results  Component Value Date   NA 137 01/13/2024   K 3.9 01/13/2024   CL 100 01/13/2024   CO2 24 01/13/2024   BUN 74 (H) 01/13/2024   CREATININE 7.25 (H) 01/13/2024   CALCIUM  8.7 (L) 01/13/2024   ALBUMIN 2.4 (L) 01/13/2024   PHOS 5.7 (H) 01/13/2024    CBC Recent Labs  Lab 01/11/24 0411 01/11/24 0633 01/12/24 0412 01/13/24 0410  WBC 6.6  --  8.5 7.9  HGB 6.7* 6.7* 8.5* 8.4*  HCT 22.2* 22.2* 26.7* 26.8*  MCV 86.0  --  86.1 85.9  PLT 259  --  250 255

## 2024-01-13 NOTE — Discharge Summary (Signed)
 Physician Discharge Summary  Melissa [name] FMW:[id number] DOB: [birth year] DOA: 01/09/2024  PCP: Alston Silvio BROCKS, FNP Nephrology: [facility] Admit date: 01/09/2024 Discharge date: 01/13/2024  Admitted From:  Home  Disposition:  Home   Recommendations for Outpatient Follow-up:  Follow up with PCP in 1 weeks [facility] arranging labs to be done in next 4-5 days  Discharge Condition: STABLE   CODE STATUS: FULL DIET: renal/carb diet with fluid restriction   Brief Hospitalization Summary: Please see all hospital notes, images, labs for full details of the hospitalization. Admission provider HPI:  48 y.o. 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  Patient complained that shortness of breath progressively worsened throughout the day yesterday and this worsened even when she takes a few steps.  EMS was activated and on arrival of EMS team O2 sat was in the 70s on 2 LPM of oxygen.  This was increased to 6 LPM with O2 sats improving to 93%.  She denied chest pain.  Patient was taken to the ED for further evaluation and management.   She was recently admitted from 6/7 to 6/9 due to acute on chronic diastolic heart failure in the setting of CKD stage V and she was treated with IV Lasix  from 20 mg every 8 hours.  Nephrologist Dr. Marlee was consulted at that time.  She is again admitted with volume overload, stage V CKD, Acute HFpEF, and acute respiratory distress.    Hospital Course by listed problem  Acute on chronic HFpEF Massive Volume Overload  Elevated BNP Continue total input/output, daily weights and fluid restriction Treated with IV Lasix  120 mg TID and now transitioned by nephrologist to torsemide  100 mg BID  and metolazone  5 mg three times weekly - Pt is tolerating well.   Discussed with nephrologist, ok to discharge home today on current regimen, [facility] will arrange for labs to be checked in next 4-5 days and outpatient OV.  Weight coming down nicely with diuresis Continue renal/modified carb diet Echocardiogram done 10/16/2023 showed LVEF of 75%.  Moderate concentric LVH.  G2 DD.     Intake/Output Summary (Last 24 hours) at 01/12/2024 1724 Last data filed at 01/12/2024 0500    Gross per 24 hour  Intake 1524.57 ml  Output 1375 ml  Net 149.57 ml         Filed Weights    01/09/24 2312 01/10/24 1126 01/11/24 0422  Weight: (!) 137.9 kg (!) 137.2 kg 132.4 kg    Elevated troponin secondary to type II demand ischemia Troponin 25 > 33, she denies chest pain EKG showed no acute changes   Normocytic Anemia Anemia of CKD  --Hg continue to trend down --checking stool for hemoccult positivity --Hg down to 6.7 on 7/16 --transfused 2 units PRBC, Hg up to 8.5 on 7/16   Acute on chronic respiratory failure with hypoxia Wean oxygen as able back to room air    Type 2 diabetes mellitus with hyperglycemia, controlled with renal complications Continue ISS and hypoglycemia Hemoglobin A1c 5.7% CBG (last 3)  Recent Labs    01/12/24 1946 01/13/24 0718 01/13/24 1106  GLUCAP 135* 90 188*     Hypoalbuminemia Albumin 2.6; protein supplement will be provided   Atrial Fibrillation, chronic Continue Metoprolol  Succinate 100  mg po daily  Continue with apixaban  for anticoagulation    Essential hypertension Continue with home meds   Mixed hyperlipidemia Continue with Lipitor   Depression and Anxiety Continue with citalopram    GERD -Continue with Protonix    Morbid Obesity Body mass index is 50.59 kg/m. Diet and lifestyle modification Patient may need to follow-up with outpatient PCP for weight loss program  Discharge Diagnoses:  Principal Problem:   Acute on chronic diastolic CHF  (congestive heart failure) (HCC) Active Problems:   Morbid obesity with BMI of 50.0-59.9, adult (HCC)   Atrial fibrillation, chronic (HCC)   Acute on chronic respiratory failure with hypoxia (HCC)   Essential hypertension   Elevated troponin   Type 2 diabetes mellitus with hyperglycemia (HCC)   Hypoalbuminemia due to protein-calorie malnutrition (HCC)   Mixed hyperlipidemia   Anxiety and depression   GERD (gastroesophageal reflux disease)   Discharge Instructions:  Allergies as of 01/13/2024       Reactions   Ozempic (0.25 Or 0.5 Mg-dose) [semaglutide(0.25 Or 0.5mg -dos)] Nausea And Vomiting        Medication List     STOP taking these medications    potassium chloride  SA 20 MEQ tablet Commonly known as: KLOR-CON  M       TAKE these medications    amLODipine  10 MG tablet Commonly known as: NORVASC  Take 10 mg by mouth daily.   atorvastatin  80 MG tablet Commonly known as: LIPITOR Take 80 mg by mouth daily.   calcitRIOL  0.5 MCG capsule Commonly known as: ROCALTROL  Take 0.5 mcg by mouth daily.   citalopram  20 MG tablet Commonly known as: CELEXA  Take 20 mg by mouth daily.   Eliquis  5 MG Tabs tablet Generic drug: apixaban  Take 1 tablet (5 mg total) by mouth 2 (two) times daily. Resume from 04/30/2023   ferrous sulfate  325 (65 FE) MG tablet Take 325 mg by mouth daily.   fluticasone 50 MCG/ACT nasal spray Commonly known as: FLONASE Place 1 spray into both nostrils once as needed for allergies or rhinitis.   hydrALAZINE  100 MG tablet Commonly known as: APRESOLINE  Take 1 tablet (100 mg total) by mouth every 8 (eight) hours.   loratadine  10 MG tablet Commonly known as: CLARITIN  Take 10 mg by mouth daily as needed for allergies.   metolazone  5 MG tablet Commonly known as: ZAROXOLYN  Take 1 tablet (5 mg total) by mouth 3 (three) times a week. Start taking on: January 14, 2024   metoprolol  succinate 100 MG 24 hr tablet Commonly known as: TOPROL -XL Take 1  tablet (100 mg total) by mouth daily. Take with or immediately following a meal.   NASAFLO PORCELAIN NASAL RINSE NA Place 1 each into the nose once as needed (for congestion).   OXYGEN Inhale 2 L into the lungs continuous.   Protonix  40 MG tablet Generic drug: pantoprazole  Take 40 mg by mouth daily.   sodium bicarbonate  650 MG tablet Take 650 mg by mouth 2 (two) times daily.   torsemide  100 MG tablet Commonly known as: DEMADEX  Take 1 tablet (100 mg total) by mouth 2 (two) times daily. What changed:  medication strength how much to take additional instructions   Tradjenta  5 MG Tabs tablet Generic drug: linagliptin  Take 5 mg by mouth daily.        Follow-up Information     Bucio, Silvio BROCKS, FNP Follow up.   Specialty: Family Medicine Why: Hospital Follow Up Contact information: [address] [place] [address] [phone]  [facility] Follow up in 5 day(s).   Why: Hospital Follow Up as arranged, OFFICE WILL CALL YOU               Allergies  Allergen Reactions   Ozempic (0.25 Or 0.5 Mg-Dose) [Semaglutide(0.25 Or 0.5mg -Dos)] Nausea And Vomiting   Allergies as of 01/13/2024       Reactions   Ozempic (0.25 Or 0.5 Mg-dose) [semaglutide(0.25 Or 0.5mg -dos)] Nausea And Vomiting        Medication List     STOP taking these medications    potassium chloride  SA 20 MEQ tablet Commonly known as: KLOR-CON  M       TAKE these medications    amLODipine  10 MG tablet Commonly known as: NORVASC  Take 10 mg by mouth daily.   atorvastatin  80 MG tablet Commonly known as: LIPITOR Take 80 mg by mouth daily.   calcitRIOL  0.5 MCG capsule Commonly known as: ROCALTROL  Take 0.5 mcg by mouth daily.   citalopram  20 MG tablet Commonly known as: CELEXA  Take 20 mg by mouth daily.   Eliquis  5 MG Tabs tablet Generic drug: apixaban  Take 1 tablet (5 mg total) by mouth 2 (two) times daily. Resume from 04/30/2023   ferrous sulfate  325 (65 FE)  MG tablet Take 325 mg by mouth daily.   fluticasone 50 MCG/ACT nasal spray Commonly known as: FLONASE Place 1 spray into both nostrils once as needed for allergies or rhinitis.   hydrALAZINE  100 MG tablet Commonly known as: APRESOLINE  Take 1 tablet (100 mg total) by mouth every 8 (eight) hours.   loratadine  10 MG tablet Commonly known as: CLARITIN  Take 10 mg by mouth daily as needed for allergies.   metolazone  5 MG tablet Commonly known as: ZAROXOLYN  Take 1 tablet (5 mg total) by mouth 3 (three) times a week. Start taking on: January 14, 2024   metoprolol  succinate 100 MG 24 hr tablet Commonly known as: TOPROL -XL Take 1 tablet (100 mg total) by mouth daily. Take with or immediately following a meal.   NASAFLO PORCELAIN NASAL RINSE NA Place 1 each into the nose once as needed (for congestion).   OXYGEN Inhale 2 L into the lungs continuous.   Protonix  40 MG tablet Generic drug: pantoprazole  Take 40 mg by mouth daily.   sodium bicarbonate  650 MG tablet Take 650 mg by mouth 2 (two) times daily.   torsemide  100 MG tablet Commonly known as: DEMADEX  Take 1 tablet (100 mg total) by mouth 2 (two) times daily. What changed:  medication strength how much to take additional instructions   Tradjenta  5 MG Tabs tablet Generic drug: linagliptin  Take 5 mg by mouth daily.        Procedures/Studies: ECHOCARDIOGRAM LIMITED Result Date: 01/10/2024    ECHOCARDIOGRAM LIMITED REPORT   Patient Name:   Melissa [name] Date of Exam: 01/10/2024 Medical Rec #:  [id number]      Height:       65.0 in Accession #:    [id number]     Weight:       302.5 lb Date of Birth:  10/26/1975      BSA:          2.358 m Patient Age:    47 years       BP:           155/80 mmHg Patient Gender: F              HR:  88 bpm. Exam Location:  [facility] Procedure: Limited Echo and Strain Analysis (Both Spectral and Color Flow            Doppler were utilized during procedure). Indications:    CHF- Acute  Diastolic l50.31  History:        Patient has prior history of Echocardiogram examinations, most                 recent 09/27/2023. CHF, Arrythmias:Atrial Fibrillation; Risk                 Factors:Hypertension, Diabetes and Dyslipidemia. Acute on                 chronic diastolic (congestive) heart failure (HCC).  Sonographer:    Aida Pizza RCS Referring Phys: [id number] OLADAPO ADEFESO  Sonographer Comments: Global longitudinal strain was attempted. IMPRESSIONS  1. Left ventricular ejection fraction, by estimation, is >75%. The left ventricle has hyperdynamic function. There is severe left ventricular hypertrophy.  2. Right ventricular systolic function is normal. The right ventricular size is normal.  3. The inferior vena cava is normal in size with greater than 50% respiratory variability, suggesting right atrial pressure of 3 mmHg.  4. Limited echo evaluate LV function FINDINGS  Left Ventricle: Left ventricular ejection fraction, by estimation, is >75%. The left ventricle has hyperdynamic function. Strain was performed and the global longitudinal strain is indeterminate. There is severe left ventricular hypertrophy. Right Ventricle: The right ventricular size is normal. Right vetricular wall thickness was not well visualized. Right ventricular systolic function is normal. Venous: The inferior vena cava is normal in size with greater than 50% respiratory variability, suggesting right atrial pressure of 3 mmHg. LEFT VENTRICLE PLAX 2D LVIDd:         4.50 cm LVIDs:         2.10 cm LV PW:         1.70 cm LV IVS:        1.60 cm LVOT diam:     2.00 cm LVOT Area:     3.14 cm  LV Volumes (MOD) LV vol d, MOD A2C: 177.0 ml LV vol d, MOD A4C: 153.0 ml LV vol s, MOD A2C: 73.1 ml LV vol s, MOD A4C: 63.6 ml LV SV MOD A2C:     103.9 ml LV SV MOD A4C:     153.0 ml LV SV MOD BP:      101.6 ml RIGHT VENTRICLE TAPSE (M-mode): 2.5 cm LEFT ATRIUM         Index LA diam:    4.80 cm 2.04 cm/m   AORTA Ao Root diam: 3.00 cm  SHUNTS  Systemic Diam: 2.00 cm Dorn Ross MD Electronically signed by Dorn Ross MD Signature Date/Time: 01/10/2024/4:36:37 PM    Final    DG Chest Port 1 View Result Date: 01/09/2024 CLINICAL DATA:  Shortness of breath EXAM: PORTABLE CHEST 1 VIEW COMPARISON:  chest x-ray 12/03/2023 FINDINGS: The heart is enlarged. There central pulmonary vascular congestion. There are mild strandy and patchy opacities in the left mid and lower lung. There is no pleural effusion or pneumothorax. The heart is enlarged, unchanged. No pneumothorax or acute fracture. IMPRESSION: 1. Cardiomegaly with central pulmonary vascular congestion. 2. Mild strandy and patchy opacities in the left mid and lower lung, which may represent edema or atelectasis. Electronically Signed   By: Greig Pique M.D.   On: 01/09/2024 23:42     Subjective: Pt says she is feeling much better, no  SOB.  She is urinating frequently.   Discharge Exam: Vitals:   01/13/24 0623 01/13/24 1300  BP: (!) 146/82 (!) 158/89  Pulse: 75 72  Resp: 18 18  Temp: 98.7 F (37.1 C) 98.7 F (37.1 C)  SpO2: 97% 100%   Vitals:   01/12/24 1241 01/12/24 1944 01/13/24 0623 01/13/24 1300  BP: (!) 146/77 (!) 179/96 (!) 146/82 (!) 158/89  Pulse: 69 77 75 72  Resp: 18 18 18 18   Temp: 98.3 F (36.8 C) 98.8 F (37.1 C) 98.7 F (37.1 C) 98.7 F (37.1 C)  TempSrc: Oral Oral  Oral  SpO2: 96% 98% 97% 100%  Weight:   130.5 kg   Height:       General exam: Appears calm and comfortable appears massively volume overloaded Respiratory system: Clear to auscultation. Respiratory effort normal. Cardiovascular system: normal S1 & S2 heard. No JVD, murmurs, rubs, gallops or clicks. 1+ pedal edema. Gastrointestinal system: Abdomen is nondistended, soft and nontender. No organomegaly or masses felt. Normal bowel sounds heard. Central nervous system: Alert and oriented. No focal neurological deficits. Extremities: 1-2+ edema.  Skin: No rashes, lesions or  ulcers. Psychiatry: Judgement and insight appear normal. Mood & affect appropriate   The results of significant diagnostics from this hospitalization (including imaging, microbiology, ancillary and laboratory) are listed below for reference.     Microbiology: No results found for this or any previous visit (from the past 240 hours).   Labs: BNP (last 3 results) Recent Labs    10/01/23 0454 12/03/23 1701 01/09/24 2355  BNP 364.2* 572.0* 454.0*   Basic Metabolic Panel: Recent Labs  Lab 01/09/24 2355 01/10/24 0754 01/11/24 0411 01/12/24 0412 01/13/24 0410  NA 139  --  138 135 137  K 4.2  --  4.0 4.0 3.9  CL 103  --  103 101 100  CO2 20*  --  23 25 24   GLUCOSE 188*  --  86 95 86  BUN 67*  --  72* 67* 74*  CREATININE 6.56*  --  6.75* 7.12* 7.25*  CALCIUM  8.5*  --  8.4* 8.6* 8.7*  MG  --   --   --  1.9 2.0  PHOS  --  4.9*  --  5.5* 5.7*   Liver Function Tests: Recent Labs  Lab 01/09/24 2355 01/12/24 0412 01/13/24 0410  AST 18  --   --   ALT 12  --   --   ALKPHOS 47  --   --   BILITOT 0.6  --   --   PROT 6.9  --   --   ALBUMIN 2.6* 2.5* 2.4*   No results for input(s): LIPASE, AMYLASE in the last 168 hours. No results for input(s): AMMONIA in the last 168 hours. CBC: Recent Labs  Lab 01/09/24 2355 01/11/24 0411 01/11/24 9366 01/12/24 0412 01/13/24 0410  WBC 8.9 6.6  --  8.5 7.9  HGB 7.2* 6.7* 6.7* 8.5* 8.4*  HCT 24.0* 22.2* 22.2* 26.7* 26.8*  MCV 85.7 86.0  --  86.1 85.9  PLT 267 259  --  250 255   Cardiac Enzymes: No results for input(s): CKTOTAL, CKMB, CKMBINDEX, TROPONINI in the last 168 hours. BNP: Invalid input(s): POCBNP CBG: Recent Labs  Lab 01/12/24 1125 01/12/24 1558 01/12/24 1946 01/13/24 0718 01/13/24 1106  GLUCAP 222* 100* 135* 90 188*   D-Dimer No results for input(s): DDIMER in the last 72 hours. Hgb A1c No results for input(s): HGBA1C in the last 72 hours. Lipid Profile  No results for input(s): CHOL,  HDL, LDLCALC, TRIG, CHOLHDL, LDLDIRECT in the last 72 hours. Thyroid function studies No results for input(s): TSH, T4TOTAL, T3FREE, THYROIDAB in the last 72 hours.  Invalid input(s): FREET3 Anemia work up No results for input(s): VITAMINB12, FOLATE, FERRITIN, TIBC, IRON , RETICCTPCT in the last 72 hours. Urinalysis    Component Value Date/Time   COLORURINE YELLOW 09/26/2023 1400   APPEARANCEUR HAZY (A) 09/26/2023 1400   LABSPEC 1.012 09/26/2023 1400   PHURINE 6.0 09/26/2023 1400   GLUCOSEU 150 (A) 09/26/2023 1400   HGBUR NEGATIVE 09/26/2023 1400   BILIRUBINUR NEGATIVE 09/26/2023 1400   KETONESUR NEGATIVE 09/26/2023 1400   PROTEINUR >=300 (A) 09/26/2023 1400   NITRITE NEGATIVE 09/26/2023 1400   LEUKOCYTESUR NEGATIVE 09/26/2023 1400   Sepsis Labs Recent Labs  Lab 01/09/24 2355 01/11/24 0411 01/12/24 0412 01/13/24 0410  WBC 8.9 6.6 8.5 7.9   Microbiology No results found for this or any previous visit (from the past 240 hours).  Time coordinating discharge: 40 mins  SIGNED:  Afton Louder, MD  [facility] 01/13/2024, 1:43 PM How to contact the [facility] Attending or Consulting provider 7A - 7P or covering provider during after hours 7P -7A, for this patient?  Check the care team in [facility] and look for a) attending/consulting [facility] provider listed and b) the [facility] team listed Log into [email or web address] and use [place]'s universal password to access. If you do not have the password, please contact the hospital operator. Locate the [facility] provider you are looking for under [facility] and page to a number that you can be directly reached. If you still have difficulty reaching the provider, please page the [facility] (Director on Call) for the Hospitalists listed on amion for assistance.

## 2024-01-18 ENCOUNTER — Encounter (HOSPITAL_COMMUNITY)

## 2024-01-23 ENCOUNTER — Other Ambulatory Visit: Payer: Self-pay | Admitting: *Deleted

## 2024-01-23 DIAGNOSIS — N186 End stage renal disease: Secondary | ICD-10-CM

## 2024-02-01 ENCOUNTER — Encounter (HOSPITAL_COMMUNITY)

## 2024-02-02 ENCOUNTER — Other Ambulatory Visit: Payer: Self-pay | Admitting: Internal Medicine

## 2024-02-02 ENCOUNTER — Ambulatory Visit: Attending: Internal Medicine

## 2024-02-02 ENCOUNTER — Ambulatory Visit: Attending: Internal Medicine | Admitting: Internal Medicine

## 2024-02-02 ENCOUNTER — Telehealth: Payer: Self-pay | Admitting: Internal Medicine

## 2024-02-02 ENCOUNTER — Encounter: Payer: Self-pay | Admitting: Internal Medicine

## 2024-02-02 VITALS — BP 110/73 | HR 86 | Ht 65.0 in | Wt 281.0 lb

## 2024-02-02 DIAGNOSIS — R002 Palpitations: Secondary | ICD-10-CM

## 2024-02-02 DIAGNOSIS — I480 Paroxysmal atrial fibrillation: Secondary | ICD-10-CM | POA: Insufficient documentation

## 2024-02-02 NOTE — Progress Notes (Signed)
 Cardiology Office Note  Date: 02/02/2024   ID: Melissa [name], DOB 1976-05-14, MRN [id number]  PCP:  Alston Silvio BROCKS, FNP  Cardiologist:  Diannah SHAUNNA Maywood, MD Electrophysiologist:  None   History of Present Illness: Melissa [name] is a 48 y.o. female  Referred to cardiology clinic for the management of diastolic heart failure in the setting of CAD stage V.  Patient had multiple hospitalizations recently at [facility] for the management of volume overload secondary to acute on chronic diastolic heart failure exacerbation secondary to worsening kidney function, CKD stage V.  Nephrology on board, fistula in place, currently not on dialysis.  Her diuretics were increased after she did not have any more hospitalizations.  Currently on torsemide  100 mg twice daily and metolazone  5 mg 3 times a week.  Overall stable.  Appears to be compensated.  No weight gain recently.  She reported having palpitations especially at night and describes them as weird.  No dizziness, syncope or leg swelling.  Past Medical History:  Diagnosis Date   Anemia    hx iron  infusions   Anxiety    CHF (congestive heart failure) (HCC) 09/26/2023   chronic diastolic - ER Visit   Chronic kidney disease    Complication of anesthesia    Diabetes mellitus without complication (HCC)    type 2, does not check blood sugar   Dyspnea    Dysrhythmia 01/2019   PAF   GERD (gastroesophageal reflux disease)    History of hiatal hernia    Hypertension    PONV (postoperative nausea and vomiting)     Past Surgical History:  Procedure Laterality Date   AV FISTULA PLACEMENT Right 09/21/2023   Procedure: Right Brachiocephalic ARTERIOVENOUS (AV) FISTULA CREATION;  Surgeon: Magda Debby SAILOR, MD;  Location: [facility] OR;  Service: Vascular;  Laterality: Right;   BIOPSY  02/02/2022   Procedure: BIOPSY;  Surgeon: Eartha Angelia Sieving, MD;  [facility];  Service: Gastroenterology;;   ESOPHAGOGASTRODUODENOSCOPY (EGD) WITH  PROPOFOL  N/A 02/02/2022   Procedure: ESOPHAGOGASTRODUODENOSCOPY (EGD) WITH PROPOFOL ;  Surgeon: Eartha Angelia Sieving, MD;  [facility]: [facility];  Service: Gastroenterology;  Laterality: N/A;  240   FISTULA SUPERFICIALIZATION Right 12/21/2023   Procedure: SECOND STAGE  BRACHIOCEPHALIC FISTULA SUPERFICIALIZATION;  Surgeon: Magda Debby SAILOR, MD;  Location: [facility] OR;  Service: Vascular;  Laterality: Right;  Peripheral block    Current Outpatient Medications  Medication Sig Dispense Refill   amLODipine  (NORVASC ) 10 MG tablet Take 10 mg by mouth daily.     atorvastatin  (LIPITOR) 80 MG tablet Take 80 mg by mouth daily.     calcitRIOL  (ROCALTROL ) 0.5 MCG capsule Take 0.5 mcg by mouth daily.     citalopram  (CELEXA ) 20 MG tablet Take 20 mg by mouth daily.     ELIQUIS  5 MG TABS tablet Take 1 tablet (5 mg total) by mouth 2 (two) times daily. Resume from 04/30/2023     ferrous sulfate  325 (65 FE) MG tablet Take 325 mg by mouth daily.     fluticasone (FLONASE) 50 MCG/ACT nasal spray Place 1 spray into both nostrils once as needed for allergies or rhinitis.     hydrALAZINE  (APRESOLINE ) 100 MG tablet Take 1 tablet (100 mg total) by mouth every 8 (eight) hours. 90 tablet 0   Hypertonic Nasal Wash (NASAFLO PORCELAIN NASAL RINSE NA) Place 1 each into the nose once as needed (for congestion).     loratadine  (CLARITIN ) 10 MG tablet Take 10 mg by mouth daily  as needed for allergies.     metolazone  (ZAROXOLYN ) 5 MG tablet Take 1 tablet (5 mg total) by mouth 3 (three) times a week. 12 tablet 0   metoprolol  succinate (TOPROL -XL) 100 MG 24 hr tablet Take 1 tablet (100 mg total) by mouth daily. Take with or immediately following a meal. 30 tablet 0   OXYGEN Inhale 2 L into the lungs continuous.     pantoprazole  (PROTONIX ) 40 MG tablet Take 40 mg by mouth daily.     sodium bicarbonate  650 MG tablet Take 650 mg by mouth 2 (two) times daily.     torsemide  (DEMADEX ) 100 MG tablet Take 1 tablet (100 mg total) by mouth 2  (two) times daily. 60 tablet 0   TRADJENTA  5 MG TABS tablet Take 5 mg by mouth daily.     No current facility-administered medications for this visit.   Allergies:  Ozempic (0.25 or 0.5 mg-dose) [semaglutide(0.25 or 0.5mg -dos)]   Social History: The patient  reports that she quit smoking about 32 years ago. Her smoking use included cigarettes. She has been exposed to tobacco smoke. She has never used smokeless tobacco. She reports that she does not currently use alcohol. She reports that she does not use drugs.   Family History: The patient's family history includes Asthma in her father; COPD in her mother; Diabetes in her mother; Heart failure in her father and mother.   ROS:  Please see the history of present illness. Otherwise, complete review of systems is positive for none  All other systems are reviewed and negative.   Physical Exam: VS:  BP 110/73 (BP Location: Left Arm, Cuff Size: Large)   Pulse 86   Ht 5' 5 (1.651 m)   Wt 281 lb (127.5 kg)   SpO2 98% Comment: on 2 liters oxygen  BMI 46.76 kg/m , BMI Body mass index is 46.76 kg/m.  Wt Readings from Last 3 Encounters:  02/02/24 281 lb (127.5 kg)  01/13/24 287 lb 11.2 oz (130.5 kg)  01/05/24 (!) 305 lb (138.3 kg)    General: Patient appears comfortable at rest. HEENT: Conjunctiva and lids normal, oropharynx clear with moist mucosa. Neck: Supple, no elevated JVP or carotid bruits, no thyromegaly. Lungs: Clear to auscultation, nonlabored breathing at rest. Cardiac: Regular rate and rhythm, no S3 or significant systolic murmur, no pericardial rub. Abdomen: Soft, nontender, no hepatomegaly, bowel sounds present, no guarding or rebound. Extremities: No pitting edema, distal pulses 2+. Skin: Warm and dry. Musculoskeletal: No kyphosis. Neuropsychiatric: Alert and oriented x3, affect grossly appropriate.  Recent Labwork: 09/26/2023: TSH 2.713 01/09/2024: ALT 12; AST 18; B Natriuretic Peptide 454.0 01/13/2024: BUN 74; Creatinine,  Ser 7.25; Hemoglobin 8.4; Magnesium 2.0; Platelets 255; Potassium 3.9; Sodium 137  No results found for: CHOL, TRIG, HDL, CHOLHDL, VLDL, LDLCALC, LDLDIRECT   Assessment and Plan:  Chronic diastolic heart failure - Compensated. - Multiple hospitalizations at [facility] recently due to worsening CKD stage V.  Fistula placed, currently not on dialysis. - Diuretic regimen managed by nephrology. - Continue torsemide  100 mg twice daily and metolazone  5 mg 3 times a week. - Treat underlying etiology, CKD stage V.  Paroxysmal atrial fibrillation - New onset in 2020 at [facility] with no recurrences since then. - Reports new onset palpitations especially at night. - Obtain 2-week event monitor, live - Continue metoprolol  succinate 100 mg once daily. - Continue Eliquis  5 mg twice daily.  HTN, controlled - Continue current antihypertensive medications. - Continue amlodipine  10 mg once daily. -  Continue hydralazine  100 mg every 8 hours. - Continue metoprolol  succinate 100 mg once daily. - Continue torsemide  100 mg twice daily. - HTN management nephrology.  Reviewed specialist notes, discharge summaries, labs, imaging reports.      Medication Adjustments/Labs and Tests Ordered: Current medicines are reviewed at length with the patient today.  Concerns regarding medicines are outlined above.    Disposition:  Follow up 1 year with APP.  Signed Wai Litt Arleta Maywood, MD, 02/02/2024 2:40 PM    [facility] [address] [place], [place], [place] [address]

## 2024-02-02 NOTE — Patient Instructions (Addendum)
 Medication Instructions:  Your physician recommends that you continue on your current medications as directed. Please refer to the Current Medication list given to you today.   Labwork: None  Testing/Procedures: Your physician has recommended that you wear a Zio monitor.   This monitor is a medical device that records the heart's electrical activity. Doctors most often use these monitors to diagnose arrhythmias. Arrhythmias are problems with the speed or rhythm of the heartbeat. The monitor is a small device applied to your chest. You can wear one while you do your normal daily activities. While wearing this monitor if you have any symptoms to push the button and record what you felt. Once you have worn this monitor for the period of time provider prescribed (for 14 days), you will return the monitor device in the postage paid box. Once it is returned they will download the data collected and provide [place] with a report which the provider will then review and we will call you with those results. Important tips:  Avoid showering during the first 24 hours of wearing the monitor. Avoid excessive sweating to help maximize wear time. Do not submerge the device, no hot tubs, and no swimming pools. Keep any lotions or oils away from the patch. After 24 hours you may shower with the patch on. Take brief showers with your back facing the shower head.  Do not remove patch once it has been placed because that will interrupt data and decrease adhesive wear time. Push the button when you have any symptoms and write down what you were feeling. Once you have completed wearing your monitor, remove and place into box which has postage paid and place in your outgoing mailbox.  If for some reason you have misplaced your box then call our office and we can provide another box and/or mail it off for you.   Follow-Up: Your physician recommends that you schedule a follow-up appointment in: 1 year. You will receive a  reminder call in about 8 months reminding you to schedule your appointment. If you don't receive this call, please contact our office.   Any Other Special Instructions Will Be Listed Below (If Applicable). Thank you for choosing [facility]!      If you need a refill on your cardiac medications before your next appointment, please call your pharmacy.

## 2024-02-02 NOTE — Telephone Encounter (Signed)
 Checking percert on the following   2 week AT monitor Dx: palpitations/Afib

## 2024-02-07 ENCOUNTER — Ambulatory Visit

## 2024-02-07 ENCOUNTER — Ambulatory Visit (HOSPITAL_COMMUNITY): Admission: RE | Admit: 2024-02-07 | Source: Ambulatory Visit

## 2024-02-09 ENCOUNTER — Ambulatory Visit (HOSPITAL_COMMUNITY): Admission: RE | Admit: 2024-02-09 | Source: Ambulatory Visit

## 2024-02-10 DIAGNOSIS — Z992 Dependence on renal dialysis: Secondary | ICD-10-CM | POA: Insufficient documentation

## 2024-02-13 ENCOUNTER — Telehealth: Payer: Self-pay

## 2024-02-13 NOTE — Telephone Encounter (Signed)
 LVM for patient to call and discuss scheduling the PFT ordered by Candis Dandy, PA

## 2024-02-15 ENCOUNTER — Encounter (HOSPITAL_BASED_OUTPATIENT_CLINIC_OR_DEPARTMENT_OTHER): Admitting: Internal Medicine

## 2024-02-15 NOTE — Telephone Encounter (Signed)
 LVM regarding the Thursday 03/29/24 10:00 am PFT appointment at [facility] time is 9:45 am at the 1st floor registration desk.  WIll mail information to patient and requested return call with qiuestions or concerns

## 2024-03-02 ENCOUNTER — Ambulatory Visit: Admitting: Adult Health

## 2024-03-05 ENCOUNTER — Ambulatory Visit (HOSPITAL_COMMUNITY): Admission: RE | Admit: 2024-03-05 | Source: Home / Self Care | Admitting: Vascular Surgery

## 2024-03-05 ENCOUNTER — Encounter: Payer: Self-pay | Admitting: Nephrology

## 2024-03-05 ENCOUNTER — Encounter (HOSPITAL_COMMUNITY): Payer: Self-pay

## 2024-03-05 ENCOUNTER — Encounter (HOSPITAL_COMMUNITY): Admission: RE | Payer: Self-pay | Source: Home / Self Care

## 2024-03-05 SURGERY — A/V SHUNT INTERVENTION
Anesthesia: LOCAL | Site: Arm Upper | Laterality: Right

## 2024-03-07 DIAGNOSIS — R002 Palpitations: Secondary | ICD-10-CM

## 2024-03-08 ENCOUNTER — Ambulatory Visit: Payer: Self-pay | Admitting: Internal Medicine

## 2024-03-09 ENCOUNTER — Encounter (HOSPITAL_COMMUNITY)
Admission: RE | Disposition: A | Discharge status: Home / Self Care | Payer: Self-pay | Source: Home / Self Care | Attending: Vascular Surgery

## 2024-03-09 ENCOUNTER — Other Ambulatory Visit: Payer: Self-pay

## 2024-03-09 ENCOUNTER — Ambulatory Visit (HOSPITAL_COMMUNITY)
Admission: RE | Admit: 2024-03-09 | Discharge: 2024-03-09 | Disposition: A | Discharge status: Home / Self Care | Payer: Self-pay | Attending: Vascular Surgery | Admitting: Vascular Surgery

## 2024-03-09 ENCOUNTER — Encounter (HOSPITAL_COMMUNITY): Payer: Self-pay | Admitting: Vascular Surgery

## 2024-03-09 DIAGNOSIS — T82858A Stenosis of vascular prosthetic devices, implants and grafts, initial encounter: Secondary | ICD-10-CM | POA: Diagnosis present

## 2024-03-09 DIAGNOSIS — Y832 Surgical operation with anastomosis, bypass or graft as the cause of abnormal reaction of the patient, or of later complication, without mention of misadventure at the time of the procedure: Secondary | ICD-10-CM | POA: Diagnosis not present

## 2024-03-09 DIAGNOSIS — N186 End stage renal disease: Secondary | ICD-10-CM | POA: Diagnosis not present

## 2024-03-09 DIAGNOSIS — Z87891 Personal history of nicotine dependence: Secondary | ICD-10-CM | POA: Diagnosis not present

## 2024-03-09 DIAGNOSIS — Z79899 Other long term (current) drug therapy: Secondary | ICD-10-CM | POA: Insufficient documentation

## 2024-03-09 DIAGNOSIS — I132 Hypertensive heart and chronic kidney disease with heart failure and with stage 5 chronic kidney disease, or end stage renal disease: Secondary | ICD-10-CM | POA: Diagnosis not present

## 2024-03-09 DIAGNOSIS — E1122 Type 2 diabetes mellitus with diabetic chronic kidney disease: Secondary | ICD-10-CM | POA: Diagnosis not present

## 2024-03-09 DIAGNOSIS — Z992 Dependence on renal dialysis: Secondary | ICD-10-CM | POA: Diagnosis not present

## 2024-03-09 DIAGNOSIS — Z7984 Long term (current) use of oral hypoglycemic drugs: Secondary | ICD-10-CM | POA: Insufficient documentation

## 2024-03-09 DIAGNOSIS — I5032 Chronic diastolic (congestive) heart failure: Secondary | ICD-10-CM | POA: Diagnosis not present

## 2024-03-09 HISTORY — PX: A/V SHUNT INTERVENTION: CATH118220

## 2024-03-09 HISTORY — PX: VENOUS ANGIOPLASTY: CATH118376

## 2024-03-09 LAB — GLUCOSE, CAPILLARY: Glucose-Capillary: 144 mg/dL — ABNORMAL HIGH (ref 70–99)

## 2024-03-09 SURGERY — A/V SHUNT INTERVENTION
Anesthesia: LOCAL | Site: Arm Upper | Laterality: Right

## 2024-03-09 MED ORDER — LIDOCAINE HCL (PF) 1 % IJ SOLN
INTRAMUSCULAR | Status: AC
  Filled 2024-03-09: qty 30

## 2024-03-09 MED ORDER — IODIXANOL 320 MG/ML IV SOLN
INTRAVENOUS | Status: DC | PRN
  Administered 2024-03-09: 30 mL via INTRAVENOUS

## 2024-03-09 MED ORDER — HEPARIN (PORCINE) IN NACL 1000-0.9 UT/500ML-% IV SOLN
INTRAVENOUS | Status: DC | PRN
  Administered 2024-03-09: 500 mL

## 2024-03-09 MED ORDER — LIDOCAINE HCL (PF) 1 % IJ SOLN
INTRAMUSCULAR | Status: DC | PRN
  Administered 2024-03-09: 5 mL

## 2024-03-09 SURGICAL SUPPLY — 10 items
BALLOON MUSTANG 7X80X75 (BALLOONS) IMPLANT
BALLOON MUSTANG 8X60X75 (BALLOONS) IMPLANT
GUIDEWIRE ANGLED .035X150CM (WIRE) IMPLANT
KIT ENCORE 26 ADVANTAGE (KITS) IMPLANT
KIT MICROPUNCTURE NIT STIFF (SHEATH) IMPLANT
SHEATH PINNACLE R/O II 6F 4CM (SHEATH) IMPLANT
SHEATH PROBE COVER 6X72 (BAG) IMPLANT
TRAY PV CATH (CUSTOM PROCEDURE TRAY) ×2 IMPLANT
TUBING CIL FLEX 10 FLL-RA (TUBING) IMPLANT
WIRE TORQFLEX AUST .018X40CM (WIRE) IMPLANT

## 2024-03-09 NOTE — H&P (Signed)
 H&P      History of Present Illness: This is a 48 y.o. female with end-stage renal disease that presents for evaluation of right arm AV fistula.  She states they have been pulling clots recently. Has a right brachiocephalic placed by Dr. Magda.  Past Medical History:  Diagnosis Date   Anemia    hx iron  infusions   Anxiety    CHF (congestive heart failure) (HCC) 09/26/2023   chronic diastolic - ER Visit   Chronic kidney disease    Complication of anesthesia    Diabetes mellitus without complication (HCC)    type 2, does not check blood sugar   Dyspnea    Dysrhythmia 01/2019   PAF   GERD (gastroesophageal reflux disease)    History of hiatal hernia    Hypertension    PONV (postoperative nausea and vomiting)     Past Surgical History:  Procedure Laterality Date   AV FISTULA PLACEMENT Right 09/21/2023   Procedure: Right Brachiocephalic ARTERIOVENOUS (AV) FISTULA CREATION;  Surgeon: Magda Debby SAILOR, MD;  Location: [facility] OR;  Service: Vascular;  Laterality: Right;   BIOPSY  02/02/2022   Procedure: BIOPSY;  Surgeon: Eartha Angelia Sieving, MD;  [facility];  Service: Gastroenterology;;   ESOPHAGOGASTRODUODENOSCOPY (EGD) WITH PROPOFOL  N/A 02/02/2022   Procedure: ESOPHAGOGASTRODUODENOSCOPY (EGD) WITH PROPOFOL ;  Surgeon: Eartha Angelia Sieving, MD;  [facility];  Service: Gastroenterology;  Laterality: N/A;  240   FISTULA SUPERFICIALIZATION Right 12/21/2023   Procedure: SECOND STAGE  BRACHIOCEPHALIC FISTULA SUPERFICIALIZATION;  Surgeon: Magda Debby SAILOR, MD;  Location: [facility] OR;  Service: Vascular;  Laterality: Right;  Peripheral block    Allergies  Allergen Reactions   Ozempic (0.25 Or 0.5 Mg-Dose) [Semaglutide(0.25 Or 0.5mg -Dos)] Nausea And Vomiting    Prior to Admission medications   Medication Sig Start Date End Date Taking? Authorizing Provider  amLODipine  (NORVASC ) 10 MG tablet Take 10 mg by mouth daily. 12/16/21   [provider]   atorvastatin  (LIPITOR) 80 MG tablet Take 80 mg by mouth daily. 07/08/23   [provider]  calcitRIOL  (ROCALTROL ) 0.5 MCG capsule Take 0.5 mcg by mouth daily. 09/16/23   [provider]  citalopram  (CELEXA ) 20 MG tablet Take 20 mg by mouth daily. 05/07/19   [provider]  ELIQUIS  5 MG TABS tablet Take 1 tablet (5 mg total) by mouth 2 (two) times daily. Resume from 04/30/2023 09/23/23   Bethanie Cough, PA-C  ferrous sulfate  325 (65 FE) MG tablet Take 325 mg by mouth daily.    [provider]  fluticasone (FLONASE) 50 MCG/ACT nasal spray Place 1 spray into both nostrils once as needed for allergies or rhinitis.    [provider]  hydrALAZINE  (APRESOLINE ) 100 MG tablet Take 1 tablet (100 mg total) by mouth every 8 (eight) hours. 04/29/23   Cheryle Page, MD  Hypertonic Nasal Wash (NASAFLO PORCELAIN NASAL RINSE NA) Place 1 each into the nose once as needed (for congestion).    [provider]  loratadine  (CLARITIN ) 10 MG tablet Take 10 mg by mouth daily as needed for allergies.    [provider]  metolazone  (ZAROXOLYN ) 5 MG tablet Take 1 tablet (5 mg total) by mouth 3 (three) times a week. 01/14/24   Johnson, Clanford L, MD  metoprolol  succinate (TOPROL -XL) 100 MG 24 hr tablet Take 1 tablet (100 mg total) by mouth daily. Take with or immediately following a meal. 04/30/23   Cheryle Page, MD  OXYGEN Inhale 2 L into the  lungs continuous.    [provider]  pantoprazole  (PROTONIX ) 40 MG tablet Take 40 mg by mouth daily.    [provider]  sodium bicarbonate  650 MG tablet Take 650 mg by mouth 2 (two) times daily. 07/08/23   [provider]  torsemide  (DEMADEX ) 100 MG tablet Take 1 tablet (100 mg total) by mouth 2 (two) times daily. 01/13/24   Johnson, Clanford L, MD  TRADJENTA  5 MG TABS tablet Take 5 mg by mouth daily. 07/08/23   [provider]    Social History   Socioeconomic History   Marital status:  Married    Spouse name: Not on file   Number of children: Not on file   Years of education: Not on file   Highest education level: Not on file  Occupational History   Not on file  Tobacco Use   Smoking status: Former    Current packs/day: 0.00    Types: Cigarettes    Quit date: 73    Years since quitting: 32.7    Passive exposure: Past   Smokeless tobacco: Never  Vaping Use   Vaping status: Never Used  Substance and Sexual Activity   Alcohol use: Not Currently   Drug use: No   Sexual activity: Not Currently    Birth control/protection: None  Other Topics Concern   Not on file  Social History Narrative   Not on file   Social Drivers of Health   Financial Resource Strain: Low Risk  (11/10/2021)   Received from [facility]   Overall Financial Resource Strain (CARDIA)    Difficulty of Paying Living Expenses: Not very hard  Food Insecurity: No Food Insecurity (01/10/2024)   Hunger Vital Sign    Worried About Running Out of Food in the Last Year: Never true    Ran Out of Food in the Last Year: Never true  Transportation Needs: No Transportation Needs (01/10/2024)   PRAPARE - Administrator, Civil Service (Medical): No    Lack of Transportation (Non-Medical): No  Physical Activity: Not on file  Stress: Not on file  Social Connections: Not on file  Intimate Partner Violence: Not At Risk (01/10/2024)   Humiliation, Afraid, Rape, and Kick questionnaire    Fear of Current or Ex-Partner: No    Emotionally Abused: No    Physically Abused: No    Sexually Abused: No     Family History  Problem Relation Age of Onset   Diabetes Mother    COPD Mother    Heart failure Mother    Heart failure Father    Asthma Father     ROS: [x]  Positive   [ ]  Negative   [ ]  All sytems reviewed and are negative  Cardiovascular: []  chest pain/pressure []  palpitations []  SOB lying flat []  DOE []  pain in legs while walking []  pain in legs at rest []  pain in legs at  night []  non-healing ulcers []  hx of DVT []  swelling in legs  Pulmonary: []  productive cough []  asthma/wheezing []  home O2  Neurologic: []  weakness in []  arms []  legs []  numbness in []  arms []  legs []  hx of CVA []  mini stroke [] difficulty speaking or slurred speech []  temporary loss of vision in one eye []  dizziness  Hematologic: []  hx of cancer []  bleeding problems []  problems with blood clotting easily  Endocrine:   []  diabetes []  thyroid disease  GI []  vomiting blood []  blood in stool  GU: []  CKD/renal failure []   HD--[]  M/W/F or []  T/T/S []  burning with urination []  blood in urine  Psychiatric: []  anxiety []  depression  Musculoskeletal: []  arthritis []  joint pain  Integumentary: []  rashes []  ulcers  Constitutional: []  fever []  chills   Physical Examination  Vitals:   03/09/24 0826 03/09/24 0839  BP: 138/75 138/75  Pulse: 85 87  Resp: 12 14  Temp: 97.9 F (36.6 C)   SpO2: 97% 97%   There is no height or weight on file to calculate BMI.  General:  WDWN in NAD Gait: Not observed HENT: WNL, normocephalic Pulmonary: normal non-labored breathing Cardiac: regular, without  Murmurs, rubs or gallops Abdomen:  soft, NT/ND Vascular Exam/Pulses: Right arm AV fistula with thrill   CBC    Component Value Date/Time   WBC 7.9 01/13/2024 0410   RBC 3.12 (L) 01/13/2024 0410   HGB 8.4 (L) 01/13/2024 0410   HCT 26.8 (L) 01/13/2024 0410   PLT 255 01/13/2024 0410   MCV 85.9 01/13/2024 0410   MCH 26.9 01/13/2024 0410   MCHC 31.3 01/13/2024 0410   RDW 16.3 (H) 01/13/2024 0410   LYMPHSABS 0.6 (L) 12/03/2023 1701   MONOABS 0.5 12/03/2023 1701   EOSABS 0.3 12/03/2023 1701   BASOSABS 0.0 12/03/2023 1701    BMET    Component Value Date/Time   NA 137 01/13/2024 0410   K 3.9 01/13/2024 0410   CL 100 01/13/2024 0410   CO2 24 01/13/2024 0410   GLUCOSE 86 01/13/2024 0410   BUN 74 (H) 01/13/2024 0410   CREATININE 7.25 (H) 01/13/2024 0410   CALCIUM   8.7 (L) 01/13/2024 0410   GFRNONAA 6 (L) 01/13/2024 0410    COAGS: Lab Results  Component Value Date   INR 1.1 01/10/2024   INR 1.0 04/25/2023   INR 1.0 04/22/2023     Non-Invasive Vascular Imaging:    N/A   ASSESSMENT/PLAN: This is a 48 y.o. female 48 y.o. female with end-stage renal disease that presents for evaluation of right arm AV fistula.  She states they have been pulling clots recently. Has a right brachiocephalic placed by Dr. Magda.  Discussed plan for right arm fistulogram with possible intervention including angioplasty and stent.  All questions answered.  Lonni DOROTHA Gaskins, MD [facility] Office: [phone]  Lonni JINNY Gaskins

## 2024-03-09 NOTE — Op Note (Signed)
    Patient name: Melissa [name] MRN: [id number] DOB: 07-Oct-1975 Sex: female  03/09/2024 Pre-operative Diagnosis: End-stage renal disease with malfunction of right arm AV fistula Post-operative diagnosis:  Same Surgeon:  Lonni DOROTHA Gaskins, MD Procedure Performed: 1.  Ultrasound-guided access of right arm AV fistula 2.  Right upper arm fistulogram with central venogram 3.  Peripheral angioplasty cephalic vein right arm AV fistula in the distal upper arm (7 mm and 8 mm Mustang)  Indications: Patient is a 48 year old female with end-stage renal disease using a right arm brachiocephalic AV fistula.  She presents for fistulogram due to malfunction after risk-benefit discussed.  Findings:   Ultrasound-guided access right arm brachiocephalic fistula at the antecubitum.  No evidence of central venous stenosis.  The fistula is widely patent except for an 80% focal stenosis in the distal upper arm.  Stenosis was treated with a 7 mm and 8 mm Mustang to nominal pressure for 2 minutes.  Less than 30% residual stenosis.  Excellent thrill   Procedure:  The patient was identified in the holding area and taken to [facility] lab.  Placed on the table in the supine position.  The right arm was prepped draped standard sterile fashion.  Timeout was performed.  Used ultrasound to access the right arm AV fistula placed a microneedle, microwire, and micro sheath.  Then got a right upper extremity fistulogram.  Then used a Glidewire and then exchanged for a short 6 French sheath and was able to cross the stenosis in the distal upper arm.  This was treated with a 7 mm x 80 mm Mustang nominal pressure for 2 minutes.  I then upsized and used a 8 mm x 60 mm Mustang to nominal pressure for 2 minutes.  Much better flow.  No significant residual stenosis.  Wires and catheters were removed.  Pursestring was tied down and the sheath was removed.  Taken to holding in stable condition.    Lonni DOROTHA Gaskins, MD [facility] [place] Office: [phone]

## 2024-03-14 ENCOUNTER — Encounter: Payer: Self-pay | Admitting: Nephrology

## 2024-03-14 ENCOUNTER — Encounter (HOSPITAL_COMMUNITY): Payer: Self-pay

## 2024-03-14 ENCOUNTER — Other Ambulatory Visit (HOSPITAL_BASED_OUTPATIENT_CLINIC_OR_DEPARTMENT_OTHER): Payer: Self-pay

## 2024-03-28 ENCOUNTER — Encounter: Payer: Self-pay | Admitting: Nephrology

## 2024-03-28 ENCOUNTER — Encounter (HOSPITAL_COMMUNITY): Payer: Self-pay

## 2024-03-29 ENCOUNTER — Ambulatory Visit (HOSPITAL_COMMUNITY): Admission: RE | Admit: 2024-03-29 | Source: Ambulatory Visit

## 2024-04-02 ENCOUNTER — Ambulatory Visit (HOSPITAL_BASED_OUTPATIENT_CLINIC_OR_DEPARTMENT_OTHER): Admitting: Internal Medicine

## 2024-04-17 ENCOUNTER — Telehealth: Payer: Self-pay

## 2024-04-17 NOTE — Telephone Encounter (Signed)
 ATCx1 LVMTCB - pt needs appt cancelled/rescheduled as she did not have PFT or sleep study completed prior to her appt.

## 2024-04-18 ENCOUNTER — Telehealth: Payer: Self-pay

## 2024-04-18 ENCOUNTER — Encounter: Admitting: Adult Health

## 2024-04-18 NOTE — Telephone Encounter (Signed)
 ATC pt LVMTCB - Pt needs to cancel/reschedule appt as she has not had her PFT or Sleep Study done.

## 2024-04-18 NOTE — Telephone Encounter (Signed)
 Called and spoke to pt. Pt confirmed that she did not have her PFT or Sleep Study completed. Advised pt that today's visit would have to be cancelled, as there is nothing for Tammy Parrett to review considering neither test was completed. Pt verbalized understanding.  Per Madelin Stank, we need to get her PFT and Sleep Study rescheduled and get her f/u appt rescheduled.

## 2024-04-18 NOTE — Telephone Encounter (Signed)
 PFT and follow up scheduled, I will route to [facility] pool for sleep study to be scheduled.

## 2024-04-19 NOTE — Telephone Encounter (Signed)
 Rescheduled patient's split-night study, she was a no-show for the previous appointment on Monday,10/6. New appointment scheduled for Sunday, 12/14. Attempted to contact Mrs. Melissa [name] to inform her, no answer. Left a detailed voicemail and mailed an appointment reminder. Nothing further needed at this time.

## 2024-04-19 NOTE — Progress Notes (Signed)
 Canceled visit, errouneous visit

## 2024-05-08 ENCOUNTER — Ambulatory Visit (HOSPITAL_BASED_OUTPATIENT_CLINIC_OR_DEPARTMENT_OTHER)

## 2024-05-08 ENCOUNTER — Encounter (HOSPITAL_BASED_OUTPATIENT_CLINIC_OR_DEPARTMENT_OTHER)

## 2024-05-08 NOTE — Progress Notes (Deleted)
 @Patient  ID: Melissa [name], female    DOB: [birth year], 48 y.o.   MRN: [id number]  No chief complaint on file.   Referring provider: Bucio, Elsa C, FNP  HPI: Discussed the use of AI scribe software for clinical note transcription with the patient, who gave verbal consent to proceed.  History of Present Illness  Last OV 01/05/2024: Patient is a 48 year old female history of hypertension, atrial fibrillation, CKD V s/p fistula placement and congestive heart failure who presents today as a new patient consultation for sleep.  Today the patient reports that while she does have issues with sleep she is also concerned about shortness of breath with exertion.   Regarding her sleep, patient reports that she snores, wakes up gasping at times, and wakes up frequently.  She feels as though she does not get restful sleep and is quite sleepy throughout the day.  This is resulted in her falling asleep at work, as well as taking naps in the afternoon to compensate.  Epworth of 12 noted for today's appointment.  She has not had a sleep test or overnight oximetry in the past. She reports that she had a recent admission about 1 month ago for volume overload.  Her medical history includes congestive heart failure as well as end-stage renal disease with a recent AV fistula placement.  During her recent admission she had significant IV diuresis with Lasix  and received transfusion of a couple units of PRBCs due to a hemoglobin below 7.  Her hemoglobin at discharge was 7.2.  She was discharged home on oxygen at 2 L via nasal cannula around-the-clock.  Currently, she denies any chest pain, peripheral edema or swelling, worsening dyspnea, headache, dizziness, lightheadedness, fever, chills, productive cough, or other complaints.  Her main complaint is dyspnea that is now limiting her ADLs.   TEST/EVENTS : Hospitalization (June 2025) for volume overload with IV diuresis with Lasix  and transfusion of packed red blood  cells.      Allergies  Allergen Reactions   Ozempic (0.25 Or 0.5 Mg-Dose) [Semaglutide(0.25 Or 0.5mg -Dos)] Nausea And Vomiting    Immunization History  Administered Date(s) Administered   Influenza, Mdck, Trivalent,PF 6+ MOS(egg free) 03/26/2019   Influenza, Seasonal, Injecte, Preservative Fre 04/20/2023   Influenza,inj,Quad PF,6+ Mos 05/11/2022   PNEUMOCOCCAL CONJUGATE-20 04/20/2023   Pneumococcal Conjugate-13 03/26/2019    Past Medical History:  Diagnosis Date   Anemia    hx iron  infusions   Anxiety    CHF (congestive heart failure) (HCC) 09/26/2023   chronic diastolic - ER Visit   Chronic kidney disease    Complication of anesthesia    Diabetes mellitus without complication (HCC)    type 2, does not check blood sugar   Dyspnea    Dysrhythmia 01/2019   PAF   GERD (gastroesophageal reflux disease)    History of hiatal hernia    Hypertension    PONV (postoperative nausea and vomiting)     Tobacco History: Social History   Tobacco Use  Smoking Status Former   Current packs/day: 0.00   Types: Cigarettes   Quit date: 1993   Years since quitting: 32.8   Passive exposure: Past  Smokeless Tobacco Never   Counseling given: Not Answered   Outpatient Medications Prior to Visit  Medication Sig Dispense Refill   amLODipine  (NORVASC ) 10 MG tablet Take 10 mg by mouth daily.     atorvastatin  (LIPITOR) 80 MG tablet Take 80 mg by mouth daily.     calcitRIOL  (ROCALTROL ) 0.5 MCG  capsule Take 0.5 mcg by mouth daily.     citalopram  (CELEXA ) 20 MG tablet Take 20 mg by mouth daily.     ELIQUIS  5 MG TABS tablet Take 1 tablet (5 mg total) by mouth 2 (two) times daily. Resume from 04/30/2023     ferrous sulfate  325 (65 FE) MG tablet Take 325 mg by mouth daily.     fluticasone (FLONASE) 50 MCG/ACT nasal spray Place 1 spray into both nostrils once as needed for allergies or rhinitis.     hydrALAZINE  (APRESOLINE ) 100 MG tablet Take 1 tablet (100 mg total) by mouth every 8 (eight)  hours. 90 tablet 0   Hypertonic Nasal Wash (NASAFLO PORCELAIN NASAL RINSE NA) Place 1 each into the nose once as needed (for congestion).     loratadine  (CLARITIN ) 10 MG tablet Take 10 mg by mouth daily as needed for allergies.     metolazone  (ZAROXOLYN ) 5 MG tablet Take 1 tablet (5 mg total) by mouth 3 (three) times a week. 12 tablet 0   metoprolol  succinate (TOPROL -XL) 100 MG 24 hr tablet Take 1 tablet (100 mg total) by mouth daily. Take with or immediately following a meal. 30 tablet 0   OXYGEN Inhale 2 L into the lungs continuous.     pantoprazole  (PROTONIX ) 40 MG tablet Take 40 mg by mouth daily.     sodium bicarbonate  650 MG tablet Take 650 mg by mouth 2 (two) times daily.     torsemide  (DEMADEX ) 100 MG tablet Take 1 tablet (100 mg total) by mouth 2 (two) times daily. 60 tablet 0   TRADJENTA  5 MG TABS tablet Take 5 mg by mouth daily.     No facility-administered medications prior to visit.     Review of Systems:   Constitutional:   No  weight loss, night sweats,  Fevers, chills, fatigue, or  lassitude.  HEENT:   No headaches,  Difficulty swallowing,  Tooth/dental problems, or  Sore throat,                No sneezing, itching, ear ache, nasal congestion, post nasal drip,   CV:  No chest pain,  Orthopnea, PND, swelling in lower extremities, anasarca, dizziness, palpitations, syncope.   GI  No heartburn, indigestion, abdominal pain, nausea, vomiting, diarrhea, change in bowel habits, loss of appetite, bloody stools.   Resp: No shortness of breath with exertion or at rest.  No excess mucus, no productive cough,  No non-productive cough,  No coughing up of blood.  No change in color of mucus.  No wheezing.  No chest wall deformity  Skin: no rash or lesions.  GU: no dysuria, change in color of urine, no urgency or frequency.  No flank pain, no hematuria   MS:  No joint pain or swelling.  No decreased range of motion.  No back pain.    Physical Exam  There were no vitals taken  for this visit.  GEN: A/Ox3; pleasant , NAD, well nourished    HEENT:  [place]/AT,  EACs-clear, TMs-wnl, NOSE-clear, THROAT-clear, no lesions, no postnasal drip or exudate noted.   NECK:  Supple w/ fair ROM; no JVD; normal carotid impulses w/o bruits; no thyromegaly or nodules palpated; no lymphadenopathy.    RESP  Clear  P & A; w/o, wheezes/ rales/ or rhonchi. no accessory muscle use, no dullness to percussion  CARD:  RRR, no m/r/g, no peripheral edema, pulses intact, no cyanosis or clubbing.  GI:   Soft & nt; nml bowel sounds;  no organomegaly or masses detected.   Musco: Warm bil, no deformities or joint swelling noted.   Neuro: alert, no focal deficits noted.    Skin: Warm, no lesions or rashes    Lab Results:  CBC    Component Value Date/Time   WBC 7.9 01/13/2024 0410   RBC 3.12 (L) 01/13/2024 0410   HGB 8.4 (L) 01/13/2024 0410   HCT 26.8 (L) 01/13/2024 0410   PLT 255 01/13/2024 0410   MCV 85.9 01/13/2024 0410   MCH 26.9 01/13/2024 0410   MCHC 31.3 01/13/2024 0410   RDW 16.3 (H) 01/13/2024 0410   LYMPHSABS 0.6 (L) 12/03/2023 1701   MONOABS 0.5 12/03/2023 1701   EOSABS 0.3 12/03/2023 1701   BASOSABS 0.0 12/03/2023 1701    BMET    Component Value Date/Time   NA 137 01/13/2024 0410   K 3.9 01/13/2024 0410   CL 100 01/13/2024 0410   CO2 24 01/13/2024 0410   GLUCOSE 86 01/13/2024 0410   BUN 74 (H) 01/13/2024 0410   CREATININE 7.25 (H) 01/13/2024 0410   CALCIUM  8.7 (L) 01/13/2024 0410   GFRNONAA 6 (L) 01/13/2024 0410    BNP    Component Value Date/Time   BNP 454.0 (H) 01/09/2024 2355    ProBNP No results found for: PROBNP  Imaging: No results found.  Administration History     None           No data to display          No results found for: NITRICOXIDE   Assessment & Plan:   Assessment & Plan    No follow-ups on file.  Candis Dandy, PA-C 05/08/2024

## 2024-05-17 ENCOUNTER — Ambulatory Visit: Admitting: Orthopedic Surgery

## 2024-05-17 ENCOUNTER — Other Ambulatory Visit: Payer: Self-pay

## 2024-05-17 ENCOUNTER — Encounter: Payer: Self-pay | Admitting: Orthopedic Surgery

## 2024-05-17 VITALS — BP 128/74 | HR 103 | Ht 65.25 in | Wt 280.0 lb

## 2024-05-17 DIAGNOSIS — M5410 Radiculopathy, site unspecified: Secondary | ICD-10-CM | POA: Diagnosis not present

## 2024-05-17 DIAGNOSIS — M545 Low back pain, unspecified: Secondary | ICD-10-CM

## 2024-05-17 DIAGNOSIS — M4726 Other spondylosis with radiculopathy, lumbar region: Secondary | ICD-10-CM

## 2024-05-17 MED ORDER — TIZANIDINE HCL 4 MG PO TABS: 4.0000 mg | ORAL_TABLET | Freq: Four times a day (QID) | ORAL | 1 refills | Status: AC | PRN

## 2024-05-17 MED ORDER — GABAPENTIN 100 MG PO CAPS: 100.0000 mg | ORAL_CAPSULE | Freq: Three times a day (TID) | ORAL | 0 refills | Status: AC

## 2024-05-17 MED ORDER — METHYLPREDNISOLONE 4 MG PO TBPK: ORAL_TABLET | ORAL | 0 refills | Status: AC

## 2024-05-17 NOTE — Patient Instructions (Addendum)
 Physical therapy has been ordered for you at [facility]. They should call you to schedule, [phone] is the phone number to call, if you want to call to schedule.   For pain control  Please read the enclosed patient education materials and take  500 mg of Tylenol  every 6 hours  The prescribed muscle relaxer  The prescribed gabapentin  In 5 days of prednisone (check your sugar frequently while on prednisone as it can cause a spike)

## 2024-05-17 NOTE — Progress Notes (Signed)
  Intake history:  Chief Complaint  Patient presents with   Back Pain    Lumbar down right leg when standing and walking pain is worse      BP 128/74   Pulse (!) 103   Ht 5' 5.25 (1.657 m)   Wt 280 lb (127 kg)   BMI 46.24 kg/m  Body mass index is 46.24 kg/m. [facility]? ___WM _________________________________  WHAT ARE WE SEEING YOU FOR TODAY?   Back goes down right leg   How long has this bothered you? (DOI?DOS?WS?)  Long duration / several months   Was there an injury? No  Anticoag.  Yes   Any ALLERGIES _______ Allergies  Allergen Reactions   Ozempic (0.25 Or 0.5 Mg-Dose) [Semaglutide(0.25 Or 0.5mg -Dos)] Nausea And Vomiting   Wound Dressing Adhesive Itching   _______________________________________   Treatment:  Have you taken:  Tylenol  Yes  Advil No  Had PT Yes but wasn't able to go a lot due to her kidneys / is ESRD on transplant list   Had injection No  Other  _________________________

## 2024-05-17 NOTE — Progress Notes (Signed)
 Office Visit Note   Patient: Melissa [name]           Date of Birth: 06-Nov-1975           MRN: [id number] Visit Date: 05/17/2024 Requested by: Alston Silvio BROCKS, FNP [address] [place],  [place] [address] PCP: Bucio, Elsa C, FNP   Assessment & Plan:   48 year old female on the transplant list for kidney failure secondary to hypertension also has diabetes presents for treatment of radicular right leg pain  Recommend nonoperative care in light of her symptoms and medical history with no previous treatment  We do recognize that the prednisone may cause an increase in glucose levels  Recommend recheck in 6 to 8 weeks  Encounter Diagnoses  Name Primary?   Lumbar pain Yes   Radicular pain of right lower extremity    Other spondylosis with radiculopathy, lumbar region     Meds ordered this encounter  Medications   tiZANidine (ZANAFLEX) 4 MG tablet    Sig: Take 1 tablet (4 mg total) by mouth every 6 (six) hours as needed for muscle spasms.    Dispense:  30 tablet    Refill:  1   methylPREDNISolone (MEDROL DOSEPAK) 4 MG TBPK tablet    Sig: 5 day dose pack standard taper    Dispense:  21 tablet    Refill:  0   gabapentin (NEURONTIN) 100 MG capsule    Sig: Take 1 capsule (100 mg total) by mouth 3 (three) times daily for 5 days.    Dispense:  15 capsule    Refill:  0      Subjective: Chief Complaint  Patient presents with   Back Pain    Lumbar down right leg when standing and walking pain is worse     HPI: 48 year old female 16-month history of back pain with bilateral thigh pain and pain running down the right leg into the foot.  No prior treatment other than Tylenol   Patient on renal transplant list secondary to hypertension              ROS: Review of Systems  Constitutional:  Negative for chills, fever, malaise/fatigue and weight loss.  Gastrointestinal:  Negative for constipation.       Denies loss bowel control   Genitourinary:        Denies urinary retention or los  of bladder control     Visit Diagnoses:  1. Lumbar pain   2. Radicular pain of right lower extremity   3. Other spondylosis with radiculopathy, lumbar region      Follow-Up Instructions: Return in about 8 weeks (around 07/12/2024) for FOLLOW UP, BACK.    Objective: Vital Signs: BP 128/74   Pulse (!) 103   Ht 5' 5.25 (1.657 m)   Wt 280 lb (127 kg)   BMI 46.24 kg/m   Physical Exam The patient is awake alert and oriented x 3 pleasant mood flat affect  General appearance no congenital abnormalities  Patient walks independently  Right and left lower extremity show normal range of motion in both hips with tenderness noted in the lower back in the midline between L4 and S1  Straight leg raise is negative on the left seated straight raise is positive on the right reflexes are 0 at the knee and ankle bilaterally  Ortho Exam  As noted above  Specialty Comments:  No specialty comments available.  Imaging: DG Lumbar Spine 2-3 Views Result Date: 05/17/2024 Back pain with right  leg radicular pain Lumbar films normal curve in the lateral view facet arthritis most likely at L3-4 L4-5 L5 1 There is some abnormal alignment in the coronal plane which looks to be reactive The disc space narrowing seen at L5-S1 not at L4-5 none at L3-4 Impression mild spondylosis lumbar spine     PMFS History: Patient Active Problem List   Diagnosis Date Noted   Dependence on renal dialysis 02/10/2024   Paroxysmal atrial fibrillation (HCC) 02/02/2024   Acute exacerbation of CHF (congestive heart failure) (HCC) 01/10/2024   Elevated troponin 01/10/2024   Type 2 diabetes mellitus with hyperglycemia (HCC) 01/10/2024   Hypoalbuminemia due to protein-calorie malnutrition 01/10/2024   Mixed hyperlipidemia 01/10/2024   Anxiety and depression 01/10/2024   GERD (gastroesophageal reflux disease) 01/10/2024   Chronic diastolic heart failure (HCC) 12/03/2023   Anemia in CKD (chronic kidney disease)  10/21/2023   Acute on chronic diastolic CHF (congestive heart failure) (HCC) 09/26/2023   CKD (chronic kidney disease) stage 5, GFR less than 15 ml/min (HCC) 09/26/2023   Pleural effusion due to CHF (congestive heart failure) (HCC) 07/23/2023   Elevated brain natriuretic peptide (BNP) level 07/23/2023   Acute on chronic respiratory failure with hypoxia (HCC) 07/23/2023   Essential hypertension 07/23/2023   Anemia of chronic renal failure 07/15/2023   Hypertensive emergency 04/17/2023   Acute kidney injury superimposed on stage 5 chronic kidney disease, not on chronic dialysis (HCC) 04/17/2023   Intractable vomiting 04/17/2023   DM (diabetes mellitus) (HCC) 04/17/2023   Morbid obesity with BMI of 50.0-59.9, adult (HCC) 04/17/2023   Atrial fibrillation, chronic (HCC) 04/17/2023   Nausea and vomiting 01/04/2022   Pain of upper abdomen 01/04/2022   Cardiomegaly 04/21/2021   Diabetes mellitus affecting pregnancy 01/20/2016   Advanced maternal age, primigravida, antepartum 01/20/2016   Advanced maternal age, 1st pregnancy    Past Medical History:  Diagnosis Date   Anemia    hx iron  infusions   Anxiety    CHF (congestive heart failure) (HCC) 09/26/2023   chronic diastolic - ER Visit   Chronic kidney disease    Complication of anesthesia    Diabetes mellitus without complication (HCC)    type 2, does not check blood sugar   Dyspnea    Dysrhythmia 01/2019   PAF   GERD (gastroesophageal reflux disease)    History of hiatal hernia    Hypertension    PONV (postoperative nausea and vomiting)     Family History  Problem Relation Age of Onset   Diabetes Mother    COPD Mother    Heart failure Mother    Heart failure Father    Asthma Father     Past Surgical History:  Procedure Laterality Date   A/V SHUNT INTERVENTION Right 03/09/2024   Procedure: A/V SHUNT INTERVENTION;  Surgeon: Gretta Lonni PARAS, MD;  Location: [facility];  Service: Cardiovascular;  Laterality: Right;   AV  FISTULA PLACEMENT Right 09/21/2023   Procedure: Right Brachiocephalic ARTERIOVENOUS (AV) FISTULA CREATION;  Surgeon: Magda Debby SAILOR, MD;  Location: [facility] OR;  Service: Vascular;  Laterality: Right;   BIOPSY  02/02/2022   Procedure: BIOPSY;  Surgeon: Eartha Angelia Sieving, MD;  Location: [facility];  Service: Gastroenterology;;   ESOPHAGOGASTRODUODENOSCOPY (EGD) WITH PROPOFOL  N/A 02/02/2022   Procedure: ESOPHAGOGASTRODUODENOSCOPY (EGD) WITH PROPOFOL ;  Surgeon: Eartha Angelia Sieving, MD;  Location: [facility];  Service: Gastroenterology;  Laterality: N/A;  240   FISTULA SUPERFICIALIZATION Right 12/21/2023   Procedure: SECOND STAGE  BRACHIOCEPHALIC FISTULA  SUPERFICIALIZATION;  Surgeon: Magda Debby SAILOR, MD;  Location: [facility] OR;  Service: Vascular;  Laterality: Right;  Peripheral block   VENOUS ANGIOPLASTY  03/09/2024   Procedure: VENOUS ANGIOPLASTY;  Surgeon: Gretta Lonni PARAS, MD;  Location: [facility];  Service: Cardiovascular;;  85% cephalic vein   Social History   Occupational History   Not on file  Tobacco Use   Smoking status: Former    Current packs/day: 0.00    Types: Cigarettes    Quit date: 1993    Years since quitting: 32.9    Passive exposure: Past   Smokeless tobacco: Never  Vaping Use   Vaping status: Never Used  Substance and Sexual Activity   Alcohol use: Not Currently   Drug use: No   Sexual activity: Not Currently    Birth control/protection: None

## 2024-05-17 NOTE — Addendum Note (Signed)
 Addended byBETHA JENEAN GREIG LELON on: 05/17/2024 02:23 PM   Modules accepted: Orders

## 2024-06-06 DIAGNOSIS — E8779 Other fluid overload: Secondary | ICD-10-CM | POA: Insufficient documentation

## 2024-06-07 DIAGNOSIS — D689 Coagulation defect, unspecified: Secondary | ICD-10-CM | POA: Insufficient documentation

## 2024-06-10 ENCOUNTER — Ambulatory Visit (HOSPITAL_BASED_OUTPATIENT_CLINIC_OR_DEPARTMENT_OTHER): Admitting: Internal Medicine

## 2024-07-03 NOTE — Therapy (Addendum)
 " OUTPATIENT PHYSICAL THERAPY THORACOLUMBAR EVALUATION   Patient Name: Melissa [name] MRN: [id number] DOB:1976-03-21, 49 y.o., female Today's Date: 07/13/2024  END OF SESSION:    Past Medical History:  Diagnosis Date   Anemia    hx iron  infusions   Anxiety    CHF (congestive heart failure) (HCC) 09/26/2023   chronic diastolic - ER Visit   Chronic kidney disease    Complication of anesthesia    Diabetes mellitus without complication (HCC)    type 2, does not check blood sugar   Dyspnea    Dysrhythmia 01/2019   PAF   GERD (gastroesophageal reflux disease)    History of hiatal hernia    Hypertension    PONV (postoperative nausea and vomiting)    Past Surgical History:  Procedure Laterality Date   A/V SHUNT INTERVENTION Right 03/09/2024   Procedure: A/V SHUNT INTERVENTION;  Surgeon: Gretta Lonni PARAS, MD;  Location: [facility];  Service: Cardiovascular;  Laterality: Right;   AV FISTULA PLACEMENT Right 09/21/2023   Procedure: Right Brachiocephalic ARTERIOVENOUS (AV) FISTULA CREATION;  Surgeon: Magda Debby SAILOR, MD;  Location: [facility] OR;  Service: Vascular;  Laterality: Right;   BIOPSY  02/02/2022   Procedure: BIOPSY;  Surgeon: Eartha Angelia Sieving, MD;  Location: [facility];  Service: Gastroenterology;;   ESOPHAGOGASTRODUODENOSCOPY (EGD) WITH PROPOFOL  N/A 02/02/2022   Procedure: ESOPHAGOGASTRODUODENOSCOPY (EGD) WITH PROPOFOL ;  Surgeon: Eartha Angelia Sieving, MD;  Location: [facility];  Service: Gastroenterology;  Laterality: N/A;  240   FISTULA SUPERFICIALIZATION Right 12/21/2023   Procedure: SECOND STAGE  BRACHIOCEPHALIC FISTULA SUPERFICIALIZATION;  Surgeon: Magda Debby SAILOR, MD;  Location: [facility];  Service: Vascular;  Laterality: Right;  Peripheral block   VENOUS ANGIOPLASTY  03/09/2024   Procedure: VENOUS ANGIOPLASTY;  Surgeon: Gretta Lonni PARAS, MD;  Location: [facility];  Service: Cardiovascular;;  85% cephalic vein   Patient Active Problem List   Diagnosis  Date Noted   Coagulation defect, unspecified 06/07/2024   Other fluid overload 06/06/2024   Dependence on renal dialysis 02/10/2024   Paroxysmal atrial fibrillation (HCC) 02/02/2024   Acute exacerbation of CHF (congestive heart failure) (HCC) 01/10/2024   Elevated troponin 01/10/2024   Type 2 diabetes mellitus with hyperglycemia (HCC) 01/10/2024   Hypoalbuminemia due to protein-calorie malnutrition 01/10/2024   Mixed hyperlipidemia 01/10/2024   Anxiety and depression 01/10/2024   GERD (gastroesophageal reflux disease) 01/10/2024   Chronic diastolic heart failure (HCC) 12/03/2023   Anemia in CKD (chronic kidney disease) 10/21/2023   Acute on chronic diastolic CHF (congestive heart failure) (HCC) 09/26/2023   CKD (chronic kidney disease) stage 5, GFR less than 15 ml/min (HCC) 09/26/2023   Pleural effusion due to CHF (congestive heart failure) (HCC) 07/23/2023   Elevated brain natriuretic peptide (BNP) level 07/23/2023   Acute on chronic respiratory failure with hypoxia (HCC) 07/23/2023   Essential hypertension 07/23/2023   Anemia of chronic renal failure 07/15/2023   Hypertensive emergency 04/17/2023   Acute kidney injury superimposed on stage 5 chronic kidney disease, not on chronic dialysis (HCC) 04/17/2023   Intractable vomiting 04/17/2023   DM (diabetes mellitus) (HCC) 04/17/2023   Morbid obesity with BMI of 50.0-59.9, adult (HCC) 04/17/2023   Atrial fibrillation, chronic (HCC) 04/17/2023   Nausea and vomiting 01/04/2022   Pain of upper abdomen 01/04/2022   Cardiomegaly 04/21/2021   Diabetes mellitus affecting pregnancy 01/20/2016   Advanced maternal age, primigravida, antepartum 01/20/2016   Advanced maternal age, 1st pregnancy     PCP: Bucio, Elsa C, FNP  REFERRING PROVIDER: Margrette Bars, MD  REFERRING DIAG:  Diagnosis  M54.50 (ICD-10-CM) - Lumbar pain  M54.10 (ICD-10-CM) - Radicular pain of right lower extremity  M47.26 (ICD-10-CM) - Other spondylosis with  radiculopathy, lumbar region    Rationale for Evaluation and Treatment: Rehabilitation  THERAPY DIAG:  Low back pain, unspecified back pain laterality, unspecified chronicity, unspecified whether sciatica present - Plan: PT plan of care cert/re-cert  Muscle weakness (generalized) - Plan: PT plan of care cert/re-cert  Difficulty in walking, not elsewhere classified - Plan: PT plan of care cert/re-cert  Bilateral leg pain - Plan: PT plan of care cert/re-cert  ONSET DATE: 2 years.    SUBJECTIVE:                                                                                                                                                                                           SUBJECTIVE STATEMENT: Here previously for leg pain; was in and out of the hospital and so was unable to keep coming to therapy at this time.  PCP referred to Dr. Margrette; did x-ray of back and referred to therapy.  Returns to [facility] 07/14/23; dialysis M,W,F  PERTINENT HISTORY:  Pain in legs; constantly cramping; she states she is stage 5 CKD.  She wants to be able to walk and lose weight and get on a donor list. Pain Only with standing and walking   CHF, CKD on dialysis, DM, afib, anxiety and depression  PAIN:  Are you having pain? Yes: NPRS scale: 3-9/10 Pain location: back and legs Pain description: aching and burningback and legs Aggravating factors: standing, walking Relieving factors: rest, tylenol   PRECAUTIONS: None  RED FLAGS:    WEIGHT BEARING RESTRICTIONS: No  FALLS:  Has patient fallen in last 6 months? No  OCCUPATION: currently not working  PLOF: Independent  PATIENT GOALS: get healthy enough to have kidney transplant  NEXT MD VISIT: 07/13/24  OBJECTIVE:  Note: Objective measures were completed at Evaluation unless otherwise noted.  DIAGNOSTIC FINDINGS:  Back pain with right leg radicular pain   Lumbar films normal curve in the lateral view facet arthritis most likely at L3-4  L4-5 L5 1   There is some abnormal alignment in the coronal plane which looks to be reactive   The disc space narrowing seen at L5-S1 not at L4-5 none at L3-4   Impression mild spondylosis lumbar spine    PATIENT SURVEYS:  Modified Oswestry:  MODIFIED OSWESTRY DISABILITY SCALE  Date: 07/05/2024 Score  Total 25/50; 50%   Interpretation of scores: Score Category Description  0-20% Minimal Disability The patient can cope with most living activities. Usually no treatment is indicated apart from advice on lifting, sitting and exercise  21-40% Moderate Disability The patient experiences more pain and difficulty with sitting, lifting and standing. Travel and social life are more difficult and they may be disabled from work. Personal care, sexual activity and sleeping are not grossly affected, and the patient can usually be managed by conservative means  41-60% Severe Disability Pain remains the main problem in this group, but activities of daily living are affected. These patients require a detailed investigation  61-80% Crippled Back pain impinges on all aspects of the patients life. Positive intervention is required  81-100% Bed-bound These patients are either bed-bound or exaggerating their symptoms  Bluford FORBES Zoe DELENA Karon DELENA, et al. Surgery versus conservative management of stable thoracolumbar fracture: the PRESTO feasibility RCT. [place] ([place]): [employer]; 2021 Nov. [facility] Technology Assessment, No. 25.62.) Appendix 3, Oswestry Disability Index category descriptors. Available from: Findjewelers.cz  Minimally Clinically Important Difference (MCID) = 12.8%  COGNITION: Overall cognitive status: Within functional limits for tasks assessed     SENSATION:   POSTURE: rounded shoulders and forward head  PALPATION: Right knee swelling noted proximally  LUMBAR ROM:   AROM eval  Flexion   Extension  To neutral only  Right lateral flexion   Left lateral flexion   Right rotation   Left rotation    (Blank rows = not tested)  LOWER EXTREMITY ROM:     Active  Right eval Left eval  Hip flexion    Hip extension    Hip abduction    Hip adduction    Hip internal rotation    Hip external rotation    Knee flexion    Knee extension Extension lag ~25 degrees   Ankle dorsiflexion    Ankle plantarflexion    Ankle inversion    Ankle eversion     (Blank rows = not tested)  LOWER EXTREMITY MMT:    MMT Right eval Left eval  Hip flexion 4+ 4+  Hip extension    Hip abduction    Hip adduction    Hip internal rotation    Hip external rotation    Knee flexion    Knee extension 4* 4+  Ankle dorsiflexion 4+ 5  Ankle plantarflexion    Ankle inversion    Ankle eversion     (Blank rows = not tested)  LUMBAR SPECIAL TESTS:    FUNCTIONAL TESTS:  5 times sit to stand: 19.46 sec hands on thighs  GAIT: Distance walked: 50 ft in clinic Assistive device utilized: None Level of assistance: Modified independence Comments: antalgic gait; decreased gait speed  TREATMENT DATE: physical therapy evaluation and HEP instruction  PATIENT EDUCATION:  Education details: Patient educated on exam findings, POC, scope of PT, HEP, and what to expect next visit. Person educated: Patient Education method: Explanation, Demonstration, and Handouts Education comprehension: verbalized understanding, returned demonstration, verbal cues required, and tactile cues required  HOME EXERCISE PROGRAM: Decompression exercises 1-5  ASSESSMENT:  CLINICAL IMPRESSION: Patient is a 49 y.o. female who was seen today for physical therapy evaluation and treatment for  M54.50 (ICD-10-CM) - Lumbar pain  M54.10 (ICD-10-CM) - Radicular pain of right lower extremity  M47.26 (ICD-10-CM) -  Other spondylosis with radiculopathy, lumbar region   Patient demonstrates muscle weakness, reduced ROM, and fascial restrictions which are likely contributing to symptoms of pain and are negatively impacting patient ability to perform ADLs and functional mobility tasks. Patient will benefit from skilled physical therapy services to address these deficits to reduce pain and improve level of function with ADLs and functional mobility tasks.   OBJECTIVE IMPAIRMENTS: Abnormal gait, decreased activity tolerance, decreased ROM, decreased strength, and pain.   ACTIVITY LIMITATIONS: carrying, lifting, bending, standing, squatting, sleeping, stairs, transfers, locomotion level, and caring for others  PARTICIPATION LIMITATIONS: meal prep, cleaning, laundry, driving, shopping, and community activity  PERSONAL FACTORS: CHF, CKD on dialysis, DM, afib, anxiety and depression are also affecting patient's functional outcome.   REHAB POTENTIAL: Good  CLINICAL DECISION MAKING: Evolving/moderate complexity  EVALUATION COMPLEXITY: Moderate   GOALS: Goals reviewed with patient? No  SHORT TERM GOALS: Target date: 07/26/2024  patient will be independent with initial HEP and compliant with HEP 3-4 times a week   Baseline: Goal status: INITIAL  2.  Patient will report 50% improvement overall  Baseline:  Goal status: INITIAL   LONG TERM GOALS: Target date: 08/17/2024  Patient will be independent in self management strategies to improve quality of life and functional outcomes.  Baseline:  Goal status: INITIAL  2.  Patient will report 70% improvement overall  Baseline:  Goal status: INITIAL  3.  Patient will improve Modified Oswestry score by 5  points to demonstrate improved perceived function  Baseline: 25/50 Goal status: INITIAL  4.   Patient will increase bilateral leg MMT's to 4+ to 5/5 to allow navigation of steps without gait deviation or loss of balance Baseline:  Goal status:  INITIAL  PLAN:  PT FREQUENCY: 1x/week  PT DURATION: 6 weeks  PLANNED INTERVENTIONS: 97164- PT Re-evaluation, 97110-Therapeutic exercises, 97530- Therapeutic activity, 97112- Neuromuscular re-education, 97535- Self Care, 02859- Manual therapy, [id number]- Gait training, [phone]- Orthotic Fit/training, [id number]- Canalith repositioning, [id number]- Aquatic Therapy, 97760- Splinting, [id number]- Wound care (first 20 sq cm), 97598- Wound care (each additional 20 sq cm)Patient/Family education, Balance training, Stair training, Taping, Dry Needling, Joint mobilization, Joint manipulation, Spinal manipulation, Spinal mobilization, Scar mobilization, and DME instructions. SABRA  PLAN FOR NEXT SESSION: Review HEP and goals; has dialysis m,w and fri; decompression exercises and postural strengthening. Check lumbar flexion next visit  7:59 AM, 07/13/24 Elianny Buxbaum Small Evelynne Spiers MPT [place] [facility] [place] [phone] Ph:[phone]   Managed Medicaid Authorization Request Treatment Start Date: August 01, 2024  Visit Dx Codes: M54.50,M62.81, R26.2, M79.604, M79.605  Functional Tool Score: Modified Oswestry 25/50; 50%  For all possible CPT codes, reference the Planned Interventions line above.     Check all conditions that are expected to impact treatment: {Conditions expected to impact treatment:Diabetes mellitus and Ongoing dialysis or cancer treatment   If treatment provided at initial evaluation, no treatment charged due to lack of authorization.      "

## 2024-07-05 ENCOUNTER — Other Ambulatory Visit: Payer: Self-pay

## 2024-07-05 ENCOUNTER — Ambulatory Visit (HOSPITAL_COMMUNITY): Attending: Orthopedic Surgery

## 2024-07-05 DIAGNOSIS — M4726 Other spondylosis with radiculopathy, lumbar region: Secondary | ICD-10-CM | POA: Insufficient documentation

## 2024-07-05 DIAGNOSIS — M79605 Pain in left leg: Secondary | ICD-10-CM | POA: Diagnosis present

## 2024-07-05 DIAGNOSIS — M545 Low back pain, unspecified: Secondary | ICD-10-CM | POA: Diagnosis present

## 2024-07-05 DIAGNOSIS — R262 Difficulty in walking, not elsewhere classified: Secondary | ICD-10-CM | POA: Insufficient documentation

## 2024-07-05 DIAGNOSIS — M6281 Muscle weakness (generalized): Secondary | ICD-10-CM | POA: Diagnosis present

## 2024-07-05 DIAGNOSIS — R2689 Other abnormalities of gait and mobility: Secondary | ICD-10-CM | POA: Insufficient documentation

## 2024-07-05 DIAGNOSIS — M5410 Radiculopathy, site unspecified: Secondary | ICD-10-CM | POA: Insufficient documentation

## 2024-07-05 DIAGNOSIS — M79604 Pain in right leg: Secondary | ICD-10-CM | POA: Diagnosis present

## 2024-07-10 ENCOUNTER — Ambulatory Visit (HOSPITAL_COMMUNITY)

## 2024-07-10 ENCOUNTER — Encounter (HOSPITAL_COMMUNITY): Payer: Self-pay

## 2024-07-10 DIAGNOSIS — M545 Low back pain, unspecified: Secondary | ICD-10-CM | POA: Diagnosis not present

## 2024-07-10 DIAGNOSIS — R262 Difficulty in walking, not elsewhere classified: Secondary | ICD-10-CM

## 2024-07-10 DIAGNOSIS — M6281 Muscle weakness (generalized): Secondary | ICD-10-CM

## 2024-07-10 DIAGNOSIS — M79604 Pain in right leg: Secondary | ICD-10-CM

## 2024-07-10 DIAGNOSIS — R2689 Other abnormalities of gait and mobility: Secondary | ICD-10-CM

## 2024-07-10 NOTE — Therapy (Addendum)
 " OUTPATIENT PHYSICAL THERAPY THORACOLUMBAR TREATMENT   Patient Name: Melissa [name] MRN: [id number] DOB:1976/01/03, 49 y.o., female Today's Date: 07/10/2024  END OF SESSION:  PT End of Session - 07/10/24 0817     Visit Number 2    Number of Visits 6    Date for Recertification  08/17/24    Authorization Type [place] MEDICAID PREPAID HEALTH PLAN [place] MEDICAID [employer]IT[employer] OF     Authorization Time Period 27 visits PT/OT/ST; auth needed after 27    Progress Note Due on Visit 27    PT Start Time 0817    PT Stop Time 0859    PT Time Calculation (min) 42 min    Activity Tolerance Patient tolerated treatment well    Behavior During Therapy [facility] for tasks assessed/performed           Past Medical History:  Diagnosis Date   Anemia    hx iron  infusions   Anxiety    CHF (congestive heart failure) (HCC) 09/26/2023   chronic diastolic - ER Visit   Chronic kidney disease    Complication of anesthesia    Diabetes mellitus without complication (HCC)    type 2, does not check blood sugar   Dyspnea    Dysrhythmia 01/2019   PAF   GERD (gastroesophageal reflux disease)    History of hiatal hernia    Hypertension    PONV (postoperative nausea and vomiting)    Past Surgical History:  Procedure Laterality Date   A/V SHUNT INTERVENTION Right 03/09/2024   Procedure: A/V SHUNT INTERVENTION;  Surgeon: Gretta Lonni PARAS, MD;  Location: [facility];  Service: Cardiovascular;  Laterality: Right;   AV FISTULA PLACEMENT Right 09/21/2023   Procedure: Right Brachiocephalic ARTERIOVENOUS (AV) FISTULA CREATION;  Surgeon: Magda Debby SAILOR, MD;  Location: [facility] OR;  Service: Vascular;  Laterality: Right;   BIOPSY  02/02/2022   Procedure: BIOPSY;  Surgeon: Eartha Angelia Sieving, MD;  [facility];  Service: Gastroenterology;;   ESOPHAGOGASTRODUODENOSCOPY (EGD) WITH PROPOFOL  N/A 02/02/2022   Procedure: ESOPHAGOGASTRODUODENOSCOPY (EGD) WITH PROPOFOL ;  Surgeon: Eartha Angelia Sieving,  MD;  Location: [facility];  Service: Gastroenterology;  Laterality: N/A;  240   FISTULA SUPERFICIALIZATION Right 12/21/2023   Procedure: SECOND STAGE  BRACHIOCEPHALIC FISTULA SUPERFICIALIZATION;  Surgeon: Magda Debby SAILOR, MD;  Location: [facility] OR;  Service: Vascular;  Laterality: Right;  Peripheral block   VENOUS ANGIOPLASTY  03/09/2024   Procedure: VENOUS ANGIOPLASTY;  Surgeon: Gretta Lonni PARAS, MD;  Location: [facility];  Service: Cardiovascular;;  85% cephalic vein   Patient Active Problem List   Diagnosis Date Noted   Dependence on renal dialysis 02/10/2024   Paroxysmal atrial fibrillation (HCC) 02/02/2024   Acute exacerbation of CHF (congestive heart failure) (HCC) 01/10/2024   Elevated troponin 01/10/2024   Type 2 diabetes mellitus with hyperglycemia (HCC) 01/10/2024   Hypoalbuminemia due to protein-calorie malnutrition 01/10/2024   Mixed hyperlipidemia 01/10/2024   Anxiety and depression 01/10/2024   GERD (gastroesophageal reflux disease) 01/10/2024   Chronic diastolic heart failure (HCC) 12/03/2023   Anemia in CKD (chronic kidney disease) 10/21/2023   Acute on chronic diastolic CHF (congestive heart failure) (HCC) 09/26/2023   CKD (chronic kidney disease) stage 5, GFR less than 15 ml/min (HCC) 09/26/2023   Pleural effusion due to CHF (congestive heart failure) (HCC) 07/23/2023   Elevated brain natriuretic peptide (BNP) level 07/23/2023   Acute on chronic respiratory failure with hypoxia (HCC) 07/23/2023   Essential hypertension 07/23/2023   Anemia of  chronic renal failure 07/15/2023   Hypertensive emergency 04/17/2023   Acute kidney injury superimposed on stage 5 chronic kidney disease, not on chronic dialysis (HCC) 04/17/2023   Intractable vomiting 04/17/2023   DM (diabetes mellitus) (HCC) 04/17/2023   Morbid obesity with BMI of 50.0-59.9, adult (HCC) 04/17/2023   Atrial fibrillation, chronic (HCC) 04/17/2023   Nausea and vomiting 01/04/2022   Pain of upper abdomen  01/04/2022   Cardiomegaly 04/21/2021   Diabetes mellitus affecting pregnancy 01/20/2016   Advanced maternal age, primigravida, antepartum 01/20/2016   Advanced maternal age, 1st pregnancy     PCP: Bucio, Silvio BROCKS, FNP  REFERRING PROVIDER: Margrette Bars, MD  REFERRING DIAG:  Diagnosis  M54.50 (ICD-10-CM) - Lumbar pain  M54.10 (ICD-10-CM) - Radicular pain of right lower extremity  M47.26 (ICD-10-CM) - Other spondylosis with radiculopathy, lumbar region    Rationale for Evaluation and Treatment: Rehabilitation  THERAPY DIAG:  Low back pain, unspecified back pain laterality, unspecified chronicity, unspecified whether sciatica present  Muscle weakness (generalized)  Difficulty in walking, not elsewhere classified  Bilateral leg pain  Other abnormalities of gait and mobility  ONSET DATE: 2 years.    SUBJECTIVE:                                                                                                                                                                                           SUBJECTIVE STATEMENT: Pt reports feeling good today. No familiar pain in back or legs at the start of session. Reports pain in knees unrelated.   Eval: Here previously for leg pain; was in and out of the hospital and so was unable to keep coming to therapy at this time.  PCP referred to Dr. Margrette; did x-ray of back and referred to therapy.  Returns to [place] [facility] 07/14/23; dialysis M,W,F  PERTINENT HISTORY:  Pain in legs; constantly cramping; she states she is stage 5 CKD.  She wants to be able to walk and lose weight and get on a donor list. Pain Only with standing and walking   CHF, CKD on dialysis, DM, afib, anxiety and depression  PAIN:  Are you having pain? Yes: NPRS scale: 2/10 Pain location: back and legs Pain description: aching and burningback and legs Aggravating factors: standing, walking Relieving factors: rest, tylenol   PRECAUTIONS: None  RED FLAGS:    WEIGHT  BEARING RESTRICTIONS: No  FALLS:  Has patient fallen in last 6 months? No  OCCUPATION: currently not working  PLOF: Independent  PATIENT GOALS: get healthy enough to have kidney transplant  NEXT MD VISIT: 07/13/24  OBJECTIVE:  Note: Objective measures were completed at Evaluation unless otherwise noted.  DIAGNOSTIC FINDINGS:  Back pain with right leg radicular pain   Lumbar films normal curve in the lateral view facet arthritis most likely at L3-4 L4-5 L5 1   There is some abnormal alignment in the coronal plane which looks to be reactive   The disc space narrowing seen at L5-S1 not at L4-5 none at L3-4   Impression mild spondylosis lumbar spine    PATIENT SURVEYS:  Modified Oswestry:  MODIFIED OSWESTRY DISABILITY SCALE  Date: 07/05/2024 Score                                Total 25/50; 50%   Interpretation of scores: Score Category Description  0-20% Minimal Disability The patient can cope with most living activities. Usually no treatment is indicated apart from advice on lifting, sitting and exercise  21-40% Moderate Disability The patient experiences more pain and difficulty with sitting, lifting and standing. Travel and social life are more difficult and they may be disabled from work. Personal care, sexual activity and sleeping are not grossly affected, and the patient can usually be managed by conservative means  41-60% Severe Disability Pain remains the main problem in this group, but activities of daily living are affected. These patients require a detailed investigation  61-80% Crippled Back pain impinges on all aspects of the patients life. Positive intervention is required  81-100% Bed-bound These patients are either bed-bound or exaggerating their symptoms  Bluford FORBES Zoe DELENA Karon DELENA, et al. Surgery versus conservative management of stable thoracolumbar fracture: the PRESTO feasibility RCT. [place] ([place]): [employer]; 2021 Nov. [facility]  Technology Assessment, No. 25.62.) Appendix 3, Oswestry Disability Index category descriptors. Available from: Findjewelers.cz  Minimally Clinically Important Difference (MCID) = 12.8%  COGNITION: Overall cognitive status: Within functional limits for tasks assessed     SENSATION:   POSTURE: rounded shoulders and forward head  PALPATION: Right knee swelling noted proximally  LUMBAR ROM:   AROM eval  Flexion 07/10/24: 47 degrees; stretch  Extension To neutral only  Right lateral flexion   Left lateral flexion   Right rotation   Left rotation    (Blank rows = not tested)  LOWER EXTREMITY ROM:     Active  Right eval Left eval  Hip flexion    Hip extension    Hip abduction    Hip adduction    Hip internal rotation    Hip external rotation    Knee flexion    Knee extension Extension lag ~25 degrees   Ankle dorsiflexion    Ankle plantarflexion    Ankle inversion    Ankle eversion     (Blank rows = not tested)  LOWER EXTREMITY MMT:    MMT Right eval Left eval  Hip flexion 4+ 4+  Hip extension    Hip abduction    Hip adduction    Hip internal rotation    Hip external rotation    Knee flexion    Knee extension 4* 4+  Ankle dorsiflexion 4+ 5  Ankle plantarflexion    Ankle inversion    Ankle eversion     (Blank rows = not tested)  LUMBAR SPECIAL TESTS:    FUNCTIONAL TESTS:  5 times sit to stand: 19.46 sec hands on thighs  GAIT: Distance walked: 50 ft in clinic Assistive device utilized: None Level of assistance: Modified independence Comments: antalgic gait; decreased gait speed  TREATMENT DATE:  07/10/24 EXERCISE LOG  Exercise Repetitions and Resistance Comments  Review HEP Decompression exercises 1-5 Performing 3x per week  Knees to chest with exercise ball 2 minutes with green ball Moderate cueing for core activation  Isometric glute sets 15 reps with 3 sec hold   LE adduction  with ball 15 reps with 3 second hold   Marches in supine 30 reps alternating LE Popping felt in lower thoracic. Moderate cueing for core activation and control.  Lower trunk rotation 15 reps alternating LE Feels tightness and pulling in low back  Manual therapy  In prone; CPA L1-3 grade 1-2 x 10 seconds. Hypomobile and uncomfortable at grade 2. STM to lats and paraspinals. Global tightness, trigger points on left side.  Nu Step Seat 10 x L2; 5 minutes     Blank cell = exercise not performed today   Eval: physical therapy evaluation and HEP instruction                                                                                                                                 PATIENT EDUCATION:  Education details: Patient educated on exam findings, POC, scope of PT, HEP, and what to expect next visit. Person educated: Patient Education method: Explanation, Demonstration, and Handouts Education comprehension: verbalized understanding, returned demonstration, verbal cues required, and tactile cues required  HOME EXERCISE PROGRAM: Decompression exercises 1-5  ASSESSMENT:  CLINICAL IMPRESSION: Pt began postural and LE strengthening exercises today. Pt tolerated exercises in supine well with minimal pain, but reports that standing and walking still aggravate symptoms. She required moderate verbal cueing for core activation during postural exercises. Lumbar flexion measured 47 degrees with a report of stretching and pulling in her mid and lower back. Manual therapy was performed to address tightness in the mid-low back. Global tightness in the latissimus dorsi and paraspinal muscles bilaterally. Trigger points on the left side caused moderate discomfort but relieved quickly. Grade 1 and 2 CPAs performed to L1-3 vertebrae, discomfort for patient with grade 2. Nu Step performed to encourage increased LE mobility. Pt reports feeling tired at the end of session today, but no pain and minimal soreness.  Pt will continue to benefit from skilled physical therapy to address postural, LE strength, and functional deficits to address functional limitations.  Eval: Patient is a 49 y.o. female who was seen today for physical therapy evaluation and treatment for  M54.50 (ICD-10-CM) - Lumbar pain  M54.10 (ICD-10-CM) - Radicular pain of right lower extremity  M47.26 (ICD-10-CM) - Other spondylosis with radiculopathy, lumbar region   Patient demonstrates muscle weakness, reduced ROM, and fascial restrictions which are likely contributing to symptoms of pain and are negatively impacting patient ability to perform ADLs and functional mobility tasks. Patient will benefit from skilled physical therapy services to address these deficits to reduce pain and improve level of function with ADLs and functional mobility tasks.   OBJECTIVE IMPAIRMENTS: Abnormal gait, decreased activity tolerance, decreased  ROM, decreased strength, and pain.   ACTIVITY LIMITATIONS: carrying, lifting, bending, standing, squatting, sleeping, stairs, transfers, locomotion level, and caring for others  PARTICIPATION LIMITATIONS: meal prep, cleaning, laundry, driving, shopping, and community activity  PERSONAL FACTORS: CHF, CKD on dialysis, DM, afib, anxiety and depression are also affecting patient's functional outcome.   REHAB POTENTIAL: Good  CLINICAL DECISION MAKING: Evolving/moderate complexity  EVALUATION COMPLEXITY: Moderate   GOALS: Goals reviewed with patient? No  SHORT TERM GOALS: Target date: 07/26/2024  patient will be independent with initial HEP and compliant with HEP 3-4 times a week   Baseline: Goal status: INITIAL  2.  Patient will report 50% improvement overall  Baseline:  Goal status: INITIAL   LONG TERM GOALS: Target date: 08/17/2024  Patient will be independent in self management strategies to improve quality of life and functional outcomes.  Baseline:  Goal status: INITIAL  2.  Patient will  report 70% improvement overall  Baseline:  Goal status: INITIAL  3.  Patient will improve Modified Oswestry score by 5  points to demonstrate improved perceived function  Baseline: 25/50 Goal status: INITIAL  4.   Patient will increase bilateral leg MMT's to 4+ to 5/5 to allow navigation of steps without gait deviation or loss of balance Baseline:  Goal status: INITIAL  PLAN:  PT FREQUENCY: 1x/week  PT DURATION: 6 weeks  PLANNED INTERVENTIONS: 97164- PT Re-evaluation, 97110-Therapeutic exercises, 97530- Therapeutic activity, 97112- Neuromuscular re-education, 97535- Self Care, [id number]- Manual therapy, [id number]- Gait training, [phone]- Orthotic Fit/training, [phone]- Canalith repositioning, [id number]- Aquatic Therapy, 97760- Splinting, [id number]- Wound care (first 20 sq cm), 97598- Wound care (each additional 20 sq cm)Patient/Family education, Balance training, Stair training, Taping, Dry Needling, Joint mobilization, Joint manipulation, Spinal manipulation, Spinal mobilization, Scar mobilization, and DME instructions. SABRA  PLAN FOR NEXT SESSION: has dialysis m,w and fri; Continue manual therapy to address lumbar hypomobility. Continue to progress postural and LE strengthening exercises.  Venus Galeazzi, SPT  9:40 AM, 07/10/2024  Today's treatment was completed with direct 1:1 supervision and direction by Lacinda Fass, PT, DPT  "

## 2024-07-13 ENCOUNTER — Ambulatory Visit: Admitting: Orthopedic Surgery

## 2024-07-19 ENCOUNTER — Ambulatory Visit (HOSPITAL_COMMUNITY)

## 2024-07-19 DIAGNOSIS — M545 Low back pain, unspecified: Secondary | ICD-10-CM

## 2024-07-19 DIAGNOSIS — R262 Difficulty in walking, not elsewhere classified: Secondary | ICD-10-CM

## 2024-07-19 DIAGNOSIS — R2689 Other abnormalities of gait and mobility: Secondary | ICD-10-CM

## 2024-07-19 DIAGNOSIS — M79604 Pain in right leg: Secondary | ICD-10-CM

## 2024-07-19 DIAGNOSIS — M6281 Muscle weakness (generalized): Secondary | ICD-10-CM

## 2024-07-19 NOTE — Therapy (Signed)
 " OUTPATIENT PHYSICAL THERAPY THORACOLUMBAR TREATMENT   Patient Name: Melissa [name] MRN: [id number] DOB:[birth year], 49 y.o., female Today's Date: 07/19/2024  END OF SESSION:  PT End of Session - 07/19/24 0814     Visit Number 3    Number of Visits 6    Date for Recertification  08/17/24    Authorization Type [place] MEDICAID PREPA[employer] PLAN [place] MEDICAID [employer]IT[employer] OF     Authorization Time Period 27 visits PT/OT/ST; auth needed after 27    Authorization - Visit Number 2    Progress Note Due on Visit 27    PT Start Time 0815    PT Stop Time 0855    PT Time Calculation (min) 40 min    Activity Tolerance Patient tolerated treatment well    Behavior During Therapy [facility] for tasks assessed/performed           Past Medical History:  Diagnosis Date   Anemia    hx iron  infusions   Anxiety    CHF (congestive heart failure) (HCC) 09/26/2023   chronic diastolic - ER Visit   Chronic kidney disease    Complication of anesthesia    Diabetes mellitus without complication (HCC)    type 2, does not check blood sugar   Dyspnea    Dysrhythmia 01/2019   PAF   GERD (gastroesophageal reflux disease)    History of hiatal hernia    Hypertension    PONV (postoperative nausea and vomiting)    Past Surgical History:  Procedure Laterality Date   A/V SHUNT INTERVENTION Right 03/09/2024   Procedure: A/V SHUNT INTERVENTION;  Surgeon: Gretta Lonni PARAS, MD;  Location: [facility];  Service: Cardiovascular;  Laterality: Right;   AV FISTULA PLACEMENT Right 09/21/2023   Procedure: Right Brachiocephalic ARTERIOVENOUS (AV) FISTULA CREATION;  Surgeon: Magda Debby SAILOR, MD;  Location: [facility] OR;  Service: Vascular;  Laterality: Right;   BIOPSY  02/02/2022   Procedure: BIOPSY;  Surgeon: Eartha Angelia Sieving, MD;  Location: [facility];  Service: Gastroenterology;;   ESOPHAGOGASTRODUODENOSCOPY (EGD) WITH PROPOFOL  N/A 02/02/2022   Procedure: ESOPHAGOGASTRODUODENOSCOPY (EGD) WITH PROPOFOL ;   Surgeon: Eartha Angelia Sieving, MD;  Location: [facility];  Service: Gastroenterology;  Laterality: N/A;  240   FISTULA SUPERFICIALIZATION Right 12/21/2023   Procedure: SECOND STAGE  BRACHIOCEPHALIC FISTULA SUPERFICIALIZATION;  Surgeon: Magda Debby SAILOR, MD;  Location: [facility] OR;  Service: Vascular;  Laterality: Right;  Peripheral block   VENOUS ANGIOPLASTY  03/09/2024   Procedure: VENOUS ANGIOPLASTY;  Surgeon: Gretta Lonni PARAS, MD;  Location: [facility];  Service: Cardiovascular;;  85% cephalic vein   Patient Active Problem List   Diagnosis Date Noted   Coagulation defect, unspecified 06/07/2024   Other fluid overload 06/06/2024   Dependence on renal dialysis 02/10/2024   Paroxysmal atrial fibrillation (HCC) 02/02/2024   Acute exacerbation of CHF (congestive heart failure) (HCC) 01/10/2024   Elevated troponin 01/10/2024   Type 2 diabetes mellitus with hyperglycemia (HCC) 01/10/2024   Hypoalbuminemia due to protein-calorie malnutrition 01/10/2024   Mixed hyperlipidemia 01/10/2024   Anxiety and depression 01/10/2024   GERD (gastroesophageal reflux disease) 01/10/2024   Chronic diastolic heart failure (HCC) 12/03/2023   Anemia in CKD (chronic kidney disease) 10/21/2023   Acute on chronic diastolic CHF (congestive heart failure) (HCC) 09/26/2023   CKD (chronic kidney disease) stage 5, GFR less than 15 ml/min (HCC) 09/26/2023   Pleural effusion due to CHF (congestive heart failure) (HCC) 07/23/2023   Elevated brain natriuretic peptide (BNP) level 07/23/2023  Acute on chronic respiratory failure with hypoxia (HCC) 07/23/2023   Essential hypertension 07/23/2023   Anemia of chronic renal failure 07/15/2023   Hypertensive emergency 04/17/2023   Acute kidney injury superimposed on stage 5 chronic kidney disease, not on chronic dialysis (HCC) 04/17/2023   Intractable vomiting 04/17/2023   DM (diabetes mellitus) (HCC) 04/17/2023   Morbid obesity with BMI of 50.0-59.9, adult (HCC)  04/17/2023   Atrial fibrillation, chronic (HCC) 04/17/2023   Nausea and vomiting 01/04/2022   Pain of upper abdomen 01/04/2022   Cardiomegaly 04/21/2021   Diabetes mellitus affecting pregnancy 01/20/2016   Advanced maternal age, primigravida, antepartum 01/20/2016   Advanced maternal age, 1st pregnancy     PCP: Bucio, Silvio BROCKS, FNP  REFERRING PROVIDER: Margrette Bars, MD  REFERRING DIAG:  Diagnosis  M54.50 (ICD-10-CM) - Lumbar pain  M54.10 (ICD-10-CM) - Radicular pain of right lower extremity  M47.26 (ICD-10-CM) - Other spondylosis with radiculopathy, lumbar region    Rationale for Evaluation and Treatment: Rehabilitation  THERAPY DIAG:  Low back pain, unspecified back pain laterality, unspecified chronicity, unspecified whether sciatica present  Muscle weakness (generalized)  Difficulty in walking, not elsewhere classified  Bilateral leg pain  Other abnormalities of gait and mobility  ONSET DATE: 2 years.    SUBJECTIVE:                                                                                                                                                                                           SUBJECTIVE STATEMENT: Pain in legs no worse but no better; knees still bothering her some.  Supposed to see Dr. Margrette on 07/26/24 for her knees; sore for a couple days after last treatment.    Eval: Here previously for leg pain; was in and out of the hospital and so was unable to keep coming to therapy at this time.  PCP referred to Dr. Margrette; did x-ray of back and referred to therapy.  Returns to [facility] 07/14/23; dialysis M,W,F  PERTINENT HISTORY:  Pain in legs; constantly cramping; she states she is stage 5 CKD.  She wants to be able to walk and lose weight and get on a donor list. Pain Only with standing and walking   CHF, CKD on dialysis, DM, afib, anxiety and depression  PAIN:  Are you having pain? Yes: NPRS scale: 2/10 Pain location: back and legs Pain  description: aching and burningback and legs Aggravating factors: standing, walking Relieving factors: rest, tylenol   PRECAUTIONS: None  RED FLAGS:    WEIGHT BEARING RESTRICTIONS: No  FALLS:  Has patient fallen in last 6 months? No  OCCUPATION: currently not working  PLOF:  Independent  PATIENT GOALS: get healthy enough to have kidney transplant  NEXT MD VISIT: 07/13/24  OBJECTIVE:  Note: Objective measures were completed at Evaluation unless otherwise noted.  DIAGNOSTIC FINDINGS:  Back pain with right leg radicular pain   Lumbar films normal curve in the lateral view facet arthritis most likely at L3-4 L4-5 L5 1   There is some abnormal alignment in the coronal plane which looks to be reactive   The disc space narrowing seen at L5-S1 not at L4-5 none at L3-4   Impression mild spondylosis lumbar spine    PATIENT SURVEYS:  Modified Oswestry:  MODIFIED OSWESTRY DISABILITY SCALE  Date: 07/05/2024 Score                                Total 25/50; 50%   Interpretation of scores: Score Category Description  0-20% Minimal Disability The patient can cope with most living activities. Usually no treatment is indicated apart from advice on lifting, sitting and exercise  21-40% Moderate Disability The patient experiences more pain and difficulty with sitting, lifting and standing. Travel and social life are more difficult and they may be disabled from work. Personal care, sexual activity and sleeping are not grossly affected, and the patient can usually be managed by conservative means  41-60% Severe Disability Pain remains the main problem in this group, but activities of daily living are affected. These patients require a detailed investigation  61-80% Crippled Back pain impinges on all aspects of the patients life. Positive intervention is required  81-100% Bed-bound These patients are either bed-bound or exaggerating their symptoms  Bluford FORBES Zoe DELENA Karon DELENA,  et al. Surgery versus conservative management of stable thoracolumbar fracture: the PRESTO feasibility RCT. [place] ([place]): [employer]; 2021 Nov. [facility] Technology Assessment, No. 25.62.) Appendix 3, Oswestry Disability Index category descriptors. Available from: Findjewelers.cz  Minimally Clinically Important Difference (MCID) = 12.8%  COGNITION: Overall cognitive status: Within functional limits for tasks assessed     SENSATION:   POSTURE: rounded shoulders and forward head  PALPATION: Right knee swelling noted proximally  LUMBAR ROM:   AROM eval  Flexion 07/10/24: 47 degrees; stretch  Extension To neutral only  Right lateral flexion   Left lateral flexion   Right rotation   Left rotation    (Blank rows = not tested)  LOWER EXTREMITY ROM:     Active  Right eval Left eval  Hip flexion    Hip extension    Hip abduction    Hip adduction    Hip internal rotation    Hip external rotation    Knee flexion    Knee extension Extension lag ~25 degrees   Ankle dorsiflexion    Ankle plantarflexion    Ankle inversion    Ankle eversion     (Blank rows = not tested)  LOWER EXTREMITY MMT:    MMT Right eval Left eval  Hip flexion 4+ 4+  Hip extension    Hip abduction    Hip adduction    Hip internal rotation    Hip external rotation    Knee flexion    Knee extension 4* 4+  Ankle dorsiflexion 4+ 5  Ankle plantarflexion    Ankle inversion    Ankle eversion     (Blank rows = not tested)  LUMBAR SPECIAL TESTS:    FUNCTIONAL TESTS:  5 times sit to stand: 19.46 sec hands on thighs  GAIT:  Distance walked: 50 ft in clinic Assistive device utilized: None Level of assistance: Modified independence Comments: antalgic gait; decreased gait speed  TREATMENT DATE:  07/19/24 Nustep seat 10 x 5' dynamic warm up Supine: LTR x 10 Abdominal bracing 5 hold x 10 Abdominal bracing with march x 10 Abdominal bracing with ball hip  adduction 5 hold x 10 Abdominal bracing with hip abduction red theraband 2 x 10 Bridge 2 x 5 Prone: manual grade 1 and 2 mobs; trigger point massage x 10' Updated HEP                                         07/10/24 EXERCISE LOG  Exercise Repetitions and Resistance Comments  Review HEP Decompression exercises 1-5 Performing 3x per week  Knees to chest with exercise ball 2 minutes with green ball Moderate cueing for core activation  Isometric glute sets 15 reps with 3 sec hold   LE adduction with ball 15 reps with 3 second hold   Marches in supine 30 reps alternating LE Popping felt in lower thoracic. Moderate cueing for core activation and control.  Lower trunk rotation 15 reps alternating LE Feels tightness and pulling in low back  Manual therapy  In prone; CPA L1-3 grade 1-2 x 10 seconds. Hypomobile and uncomfortable at grade 2. STM to lats and paraspinals. Global tightness, trigger points on left side.  Nu Step Seat 10 x L2; 5 minutes     Blank cell = exercise not performed today   Eval: physical therapy evaluation and HEP instruction                                                                                                                                 PATIENT EDUCATION:  Education details: Patient educated on exam findings, POC, scope of PT, HEP, and what to expect next visit. Person educated: Patient Education method: Explanation, Demonstration, and Handouts Education comprehension: verbalized understanding, returned demonstration, verbal cues required, and tactile cues required  HOME EXERCISE PROGRAM: Decompression exercises 1-5  ASSESSMENT:  CLINICAL IMPRESSION: Patient continues with leg pain; back pain.  Today's session with continued focus on core and postural strengthening. Added a bridge today with patient only able to perform with partial lift off table.  Updated HEP and issued theraband for home use.  Manual grade one and 2 mobilizations to lumbar spine  and trigger point massage to decrease pain in her low back; instructed patient in use of tennis ball for self trigger point release.  Patient will benefit from continued skilled therapy services to address deficits and promote return to optimal function.      Pt will continue to benefit from skilled physical therapy to address postural, LE strength, and functional deficits to address functional limitations.  Eval: Patient is a 49 y.o. female who was seen today for physical  therapy evaluation and treatment for  M54.50 (ICD-10-CM) - Lumbar pain  M54.10 (ICD-10-CM) - Radicular pain of right lower extremity  M47.26 (ICD-10-CM) - Other spondylosis with radiculopathy, lumbar region   Patient demonstrates muscle weakness, reduced ROM, and fascial restrictions which are likely contributing to symptoms of pain and are negatively impacting patient ability to perform ADLs and functional mobility tasks. Patient will benefit from skilled physical therapy services to address these deficits to reduce pain and improve level of function with ADLs and functional mobility tasks.   OBJECTIVE IMPAIRMENTS: Abnormal gait, decreased activity tolerance, decreased ROM, decreased strength, and pain.   ACTIVITY LIMITATIONS: carrying, lifting, bending, standing, squatting, sleeping, stairs, transfers, locomotion level, and caring for others  PARTICIPATION LIMITATIONS: meal prep, cleaning, laundry, driving, shopping, and community activity  PERSONAL FACTORS: CHF, CKD on dialysis, DM, afib, anxiety and depression are also affecting patient's functional outcome.   REHAB POTENTIAL: Good  CLINICAL DECISION MAKING: Evolving/moderate complexity  EVALUATION COMPLEXITY: Moderate   GOALS: Goals reviewed with patient? No  SHORT TERM GOALS: Target date: 07/26/2024  patient will be independent with initial HEP and compliant with HEP 3-4 times a week   Baseline: Goal status: INITIAL  2.  Patient will report 50%  improvement overall  Baseline:  Goal status: INITIAL   LONG TERM GOALS: Target date: 08/17/2024  Patient will be independent in self management strategies to improve quality of life and functional outcomes.  Baseline:  Goal status: INITIAL  2.  Patient will report 70% improvement overall  Baseline:  Goal status: INITIAL  3.  Patient will improve Modified Oswestry score by 5  points to demonstrate improved perceived function  Baseline: 25/50 Goal status: INITIAL  4.   Patient will increase bilateral leg MMT's to 4+ to 5/5 to allow navigation of steps without gait deviation or loss of balance Baseline:  Goal status: INITIAL  PLAN:  PT FREQUENCY: 1x/week  PT DURATION: 6 weeks  PLANNED INTERVENTIONS: 97164- PT Re-evaluation, 97110-Therapeutic exercises, 97530- Therapeutic activity, 97112- Neuromuscular re-education, 97535- Self Care, 02859- Manual therapy, [id number]- Gait training, [phone]- Orthotic Fit/training, [phone]- Canalith repositioning, [id number]- Aquatic Therapy, 97760- Splinting, [id number]- Wound care (first 20 sq cm), 97598- Wound care (each additional 20 sq cm)Patient/Family education, Balance training, Stair training, Taping, Dry Needling, Joint mobilization, Joint manipulation, Spinal manipulation, Spinal mobilization, Scar mobilization, and DME instructions. SABRA  PLAN FOR NEXT SESSION: has dialysis m,w and fri; Continue manual therapy to address lumbar hypomobility. Continue to progress postural and LE strengthening exercises.  8:56 AM, 07/19/24 Margeret Stachnik Small Wendi Lastra MPT [facility] [place] [phone] Ph:[phone]  "

## 2024-07-26 ENCOUNTER — Encounter (HOSPITAL_COMMUNITY): Payer: Self-pay

## 2024-07-26 ENCOUNTER — Ambulatory Visit (HOSPITAL_COMMUNITY)

## 2024-07-26 ENCOUNTER — Ambulatory Visit: Admitting: Orthopedic Surgery

## 2024-07-26 ENCOUNTER — Encounter: Payer: Self-pay | Admitting: Orthopedic Surgery

## 2024-07-26 ENCOUNTER — Other Ambulatory Visit (INDEPENDENT_AMBULATORY_CARE_PROVIDER_SITE_OTHER): Payer: Self-pay

## 2024-07-26 DIAGNOSIS — M25561 Pain in right knee: Secondary | ICD-10-CM | POA: Diagnosis not present

## 2024-07-26 DIAGNOSIS — R262 Difficulty in walking, not elsewhere classified: Secondary | ICD-10-CM

## 2024-07-26 DIAGNOSIS — M6281 Muscle weakness (generalized): Secondary | ICD-10-CM

## 2024-07-26 DIAGNOSIS — M545 Low back pain, unspecified: Secondary | ICD-10-CM

## 2024-07-26 DIAGNOSIS — R2689 Other abnormalities of gait and mobility: Secondary | ICD-10-CM

## 2024-07-26 DIAGNOSIS — M5410 Radiculopathy, site unspecified: Secondary | ICD-10-CM

## 2024-07-26 DIAGNOSIS — M79604 Pain in right leg: Secondary | ICD-10-CM

## 2024-07-26 DIAGNOSIS — M4726 Other spondylosis with radiculopathy, lumbar region: Secondary | ICD-10-CM

## 2024-07-26 MED ORDER — METHYLPREDNISOLONE ACETATE 40 MG/ML IJ SUSP
40.0000 mg | Freq: Once | INTRAMUSCULAR | Status: AC
  Administered 2024-07-26: 40 mg via INTRA_ARTICULAR

## 2024-07-26 NOTE — Therapy (Signed)
 " OUTPATIENT PHYSICAL THERAPY THORACOLUMBAR TREATMENT   Patient Name: Melissa [name] MRN: [id number] DOB:08-08-1975, 49 y.o., female Today's Date: 07/26/2024  END OF SESSION:  PT End of Session - 07/26/24 9177     Visit Number 4    Number of Visits 6    Date for Recertification  08/17/24    Authorization Type [place] MEDICAID PREPAID HEALTH PLAN [place] MEDICAID [employer] [place]    Authorization Time Period 27 visits PT/OT/ST; auth needed after 27    Progress Note Due on Visit 27    PT Start Time 0822    PT Stop Time 0903    PT Time Calculation (min) 41 min    Activity Tolerance Patient tolerated treatment well    Behavior During Therapy [facility] for tasks assessed/performed           Past Medical History:  Diagnosis Date   Anemia    hx iron  infusions   Anxiety    CHF (congestive heart failure) (HCC) 09/26/2023   chronic diastolic - ER Visit   Chronic kidney disease    Complication of anesthesia    Diabetes mellitus without complication (HCC)    type 2, does not check blood sugar   Dyspnea    Dysrhythmia 01/2019   PAF   GERD (gastroesophageal reflux disease)    History of hiatal hernia    Hypertension    PONV (postoperative nausea and vomiting)    Past Surgical History:  Procedure Laterality Date   A/V SHUNT INTERVENTION Right 03/09/2024   Procedure: A/V SHUNT INTERVENTION;  Surgeon: Gretta Lonni PARAS, MD;  Location: [facility];  Service: Cardiovascular;  Laterality: Right;   AV FISTULA PLACEMENT Right 09/21/2023   Procedure: Right Brachiocephalic ARTERIOVENOUS (AV) FISTULA CREATION;  Surgeon: Magda Debby SAILOR, MD;  Location: [facility] OR;  Service: Vascular;  Laterality: Right;   BIOPSY  02/02/2022   Procedure: BIOPSY;  Surgeon: Eartha Angelia Sieving, MD;  [facility];  Service: Gastroenterology;;   ESOPHAGOGASTRODUODENOSCOPY (EGD) WITH PROPOFOL  N/A 02/02/2022   Procedure: ESOPHAGOGASTRODUODENOSCOPY (EGD) WITH PROPOFOL ;  Surgeon: Eartha Angelia Sieving,  MD;  Location: [facility];  Service: Gastroenterology;  Laterality: N/A;  240   FISTULA SUPERFICIALIZATION Right 12/21/2023   Procedure: SECOND STAGE  BRACHIOCEPHALIC FISTULA SUPERFICIALIZATION;  Surgeon: Magda Debby SAILOR, MD;  Location: [facility] OR;  Service: Vascular;  Laterality: Right;  Peripheral block   VENOUS ANGIOPLASTY  03/09/2024   Procedure: VENOUS ANGIOPLASTY;  Surgeon: Gretta Lonni PARAS, MD;  Location: [facility];  Service: Cardiovascular;;  85% cephalic vein   Patient Active Problem List   Diagnosis Date Noted   Coagulation defect, unspecified 06/07/2024   Other fluid overload 06/06/2024   Dependence on renal dialysis 02/10/2024   Paroxysmal atrial fibrillation (HCC) 02/02/2024   Acute exacerbation of CHF (congestive heart failure) (HCC) 01/10/2024   Elevated troponin 01/10/2024   Type 2 diabetes mellitus with hyperglycemia (HCC) 01/10/2024   Hypoalbuminemia due to protein-calorie malnutrition 01/10/2024   Mixed hyperlipidemia 01/10/2024   Anxiety and depression 01/10/2024   GERD (gastroesophageal reflux disease) 01/10/2024   Chronic diastolic heart failure (HCC) 12/03/2023   Anemia in CKD (chronic kidney disease) 10/21/2023   Acute on chronic diastolic CHF (congestive heart failure) (HCC) 09/26/2023   CKD (chronic kidney disease) stage 5, GFR less than 15 ml/min (HCC) 09/26/2023   Pleural effusion due to CHF (congestive heart failure) (HCC) 07/23/2023   Elevated brain natriuretic peptide (BNP) level 07/23/2023   Acute on chronic respiratory failure with  hypoxia (HCC) 07/23/2023   Essential hypertension 07/23/2023   Anemia of chronic renal failure 07/15/2023   Hypertensive emergency 04/17/2023   Acute kidney injury superimposed on stage 5 chronic kidney disease, not on chronic dialysis (HCC) 04/17/2023   Intractable vomiting 04/17/2023   DM (diabetes mellitus) (HCC) 04/17/2023   Morbid obesity with BMI of 50.0-59.9, adult (HCC) 04/17/2023   Atrial fibrillation, chronic  (HCC) 04/17/2023   Nausea and vomiting 01/04/2022   Pain of upper abdomen 01/04/2022   Cardiomegaly 04/21/2021   Diabetes mellitus affecting pregnancy 01/20/2016   Advanced maternal age, primigravida, antepartum 01/20/2016   Advanced maternal age, 1st pregnancy     PCP: Bucio, Silvio BROCKS, FNP  REFERRING PROVIDER: Margrette Bars, MD  REFERRING DIAG:  Diagnosis  M54.50 (ICD-10-CM) - Lumbar pain  M54.10 (ICD-10-CM) - Radicular pain of right lower extremity  M47.26 (ICD-10-CM) - Other spondylosis with radiculopathy, lumbar region    Rationale for Evaluation and Treatment: Rehabilitation  THERAPY DIAG:  Low back pain, unspecified back pain laterality, unspecified chronicity, unspecified whether sciatica present  Muscle weakness (generalized)  Difficulty in walking, not elsewhere classified  Bilateral leg pain  Other abnormalities of gait and mobility  ONSET DATE: 2 years.    SUBJECTIVE:                                                                                                                                                                                           SUBJECTIVE STATEMENT: Reports she has pain Rt knee, pain scale 5/10, has apt with Dr Margrette later today.  Back feels okay today, hasn't done a lot of standing or walking today.  Eval: Here previously for leg pain; was in and out of the hospital and so was unable to keep coming to therapy at this time.  PCP referred to Dr. Margrette; did x-ray of back and referred to therapy.  Returns to [facility] 07/14/23; dialysis M,W,F  PERTINENT HISTORY:  Pain in legs; constantly cramping; she states she is stage 5 CKD.  She wants to be able to walk and lose weight and get on a donor list. Pain Only with standing and walking   CHF, CKD on dialysis, DM, afib, anxiety and depression  PAIN:  Are you having pain? Yes: NPRS scale: 0/10 back, 5/10 Rt knee Pain location:  no back and legs, Rt knee pain  Pain description: aching  and burningback and legs Aggravating factors: standing, walking Relieving factors: rest, tylenol   PRECAUTIONS: None  RED FLAGS:    WEIGHT BEARING RESTRICTIONS: No  FALLS:  Has patient fallen in last 6 months? No  OCCUPATION: currently not working  PLOF:  Independent  PATIENT GOALS: get healthy enough to have kidney transplant  NEXT MD VISIT: 07/26/24  OBJECTIVE:  Note: Objective measures were completed at Evaluation unless otherwise noted.  DIAGNOSTIC FINDINGS:  Back pain with right leg radicular pain   Lumbar films normal curve in the lateral view facet arthritis most likely at L3-4 L4-5 L5 1   There is some abnormal alignment in the coronal plane which looks to be reactive   The disc space narrowing seen at L5-S1 not at L4-5 none at L3-4   Impression mild spondylosis lumbar spine    PATIENT SURVEYS:  Modified Oswestry:  MODIFIED OSWESTRY DISABILITY SCALE  Date: 07/05/2024 Score                                Total 25/50; 50%   Interpretation of scores: Score Category Description  0-20% Minimal Disability The patient can cope with most living activities. Usually no treatment is indicated apart from advice on lifting, sitting and exercise  21-40% Moderate Disability The patient experiences more pain and difficulty with sitting, lifting and standing. Travel and social life are more difficult and they may be disabled from work. Personal care, sexual activity and sleeping are not grossly affected, and the patient can usually be managed by conservative means  41-60% Severe Disability Pain remains the main problem in this group, but activities of daily living are affected. These patients require a detailed investigation  61-80% Crippled Back pain impinges on all aspects of the patients life. Positive intervention is required  81-100% Bed-bound These patients are either bed-bound or exaggerating their symptoms  Bluford FORBES Zoe DELENA Karon DELENA, et al. Surgery versus  conservative management of stable thoracolumbar fracture: the PRESTO feasibility RCT. [place] ([place]): [employer]; 2021 Nov. [facility] Technology Assessment, No. 25.62.) Appendix 3, Oswestry Disability Index category descriptors. Available from: Findjewelers.cz  Minimally Clinically Important Difference (MCID) = 12.8%  COGNITION: Overall cognitive status: Within functional limits for tasks assessed     SENSATION:   POSTURE: rounded shoulders and forward head  PALPATION: Right knee swelling noted proximally  LUMBAR ROM:   AROM eval  Flexion 07/10/24: 47 degrees; stretch  Extension To neutral only  Right lateral flexion   Left lateral flexion   Right rotation   Left rotation    (Blank rows = not tested)  LOWER EXTREMITY ROM:     Active  Right eval Left eval  Hip flexion    Hip extension    Hip abduction    Hip adduction    Hip internal rotation    Hip external rotation    Knee flexion    Knee extension Extension lag ~25 degrees   Ankle dorsiflexion    Ankle plantarflexion    Ankle inversion    Ankle eversion     (Blank rows = not tested)  LOWER EXTREMITY MMT:    MMT Right eval Left eval  Hip flexion 4+ 4+  Hip extension    Hip abduction    Hip adduction    Hip internal rotation    Hip external rotation    Knee flexion    Knee extension 4* 4+  Ankle dorsiflexion 4+ 5  Ankle plantarflexion    Ankle inversion    Ankle eversion     (Blank rows = not tested)  LUMBAR SPECIAL TESTS:    FUNCTIONAL TESTS:  5 times sit to stand: 19.46 sec hands on thighs  GAIT:  Distance walked: 50 ft in clinic Assistive device utilized: None Level of assistance: Modified independence Comments: antalgic gait; decreased gait speed  TREATMENT DATE:  07/26/24: Nustep seat 10 x 5' L3 resistance dynamic warm up UE/LE SPM average 68  Sidelying:  - Clam 10x 5 GTB around thigh Supine:  - Bridge with GTB around thigh 2x 10  - March  with ab set 20x Seated  - Rows GTB  - STS from mat height (slight elevated height) 10  07/19/24 Nustep seat 10 x 5' dynamic warm up Supine: LTR x 10 Abdominal bracing 5 hold x 10 Abdominal bracing with march x 10 Abdominal bracing with ball hip adduction 5 hold x 10 Abdominal bracing with hip abduction red theraband 2 x 10 Bridge 2 x 5 Prone: manual grade 1 and 2 mobs; trigger point massage x 10' Updated HEP                                         07/10/24 EXERCISE LOG  Exercise Repetitions and Resistance Comments  Review HEP Decompression exercises 1-5 Performing 3x per week  Knees to chest with exercise ball 2 minutes with green ball Moderate cueing for core activation  Isometric glute sets 15 reps with 3 sec hold   LE adduction with ball 15 reps with 3 second hold   Marches in supine 30 reps alternating LE Popping felt in lower thoracic. Moderate cueing for core activation and control.  Lower trunk rotation 15 reps alternating LE Feels tightness and pulling in low back  Manual therapy  In prone; CPA L1-3 grade 1-2 x 10 seconds. Hypomobile and uncomfortable at grade 2. STM to lats and paraspinals. Global tightness, trigger points on left side.  Nu Step Seat 10 x L2; 5 minutes     Blank cell = exercise not performed today   Eval: physical therapy evaluation and HEP instruction                                                                                                                                 PATIENT EDUCATION:  Education details: Patient educated on exam findings, POC, scope of PT, HEP, and what to expect next visit. Person educated: Patient Education method: Explanation, Demonstration, and Handouts Education comprehension: verbalized understanding, returned demonstration, verbal cues required, and tactile cues required  HOME EXERCISE PROGRAM: Decompression exercises 1-5  07/26/24: Access Code: [email or web address] URL: [email or web address] Date:  07/26/2024 Prepared by: Augustin Mclean  Exercises - Clam with Resistance  - 2 x daily - 7 x weekly - 2 sets - 10 reps - 5 hold - Supine Bridge  - 2 x daily - 7 x weekly - 2 sets - 10 reps - 5 hold - Supine Lower Trunk Rotation  - 2 x daily - 7 x weekly - 1 sets - 10 reps - 10  hold - Supine March  - 1 x daily - 7 x weekly - 3 sets - 10 reps - 5 hold  ASSESSMENT:  CLINICAL IMPRESSION: Pt limited with Rt knee pain, monitored through session.  Session focus with core and proximal strengthening.  Added clams with theraband resistance for hip stability, noted increased fatigue Rt side compared to Lt with new exercise.  Pt presents with increased range with bridge this session.  Added sit to stands for functional strengthening, elevated height to reduce strain with exercise.  No reports of increased pain through session.  Pt with apt with Dr Margrette later today to look at knee.   Eval: Patient is a 49 y.o. female who was seen today for physical therapy evaluation and treatment for  M54.50 (ICD-10-CM) - Lumbar pain  M54.10 (ICD-10-CM) - Radicular pain of right lower extremity  M47.26 (ICD-10-CM) - Other spondylosis with radiculopathy, lumbar region   Patient demonstrates muscle weakness, reduced ROM, and fascial restrictions which are likely contributing to symptoms of pain and are negatively impacting patient ability to perform ADLs and functional mobility tasks. Patient will benefit from skilled physical therapy services to address these deficits to reduce pain and improve level of function with ADLs and functional mobility tasks.   OBJECTIVE IMPAIRMENTS: Abnormal gait, decreased activity tolerance, decreased ROM, decreased strength, and pain.   ACTIVITY LIMITATIONS: carrying, lifting, bending, standing, squatting, sleeping, stairs, transfers, locomotion level, and caring for others  PARTICIPATION LIMITATIONS: meal prep, cleaning, laundry, driving, shopping, and community  activity  PERSONAL FACTORS: CHF, CKD on dialysis, DM, afib, anxiety and depression are also affecting patient's functional outcome.   REHAB POTENTIAL: Good  CLINICAL DECISION MAKING: Evolving/moderate complexity  EVALUATION COMPLEXITY: Moderate   GOALS: Goals reviewed with patient? No  SHORT TERM GOALS: Target date: 07/26/2024  patient will be independent with initial HEP and compliant with HEP 3-4 times a week   Baseline: Goal status: INITIAL  2.  Patient will report 50% improvement overall  Baseline:  Goal status: INITIAL   LONG TERM GOALS: Target date: 08/17/2024  Patient will be independent in self management strategies to improve quality of life and functional outcomes.  Baseline:  Goal status: INITIAL  2.  Patient will report 70% improvement overall  Baseline:  Goal status: INITIAL  3.  Patient will improve Modified Oswestry score by 5  points to demonstrate improved perceived function  Baseline: 25/50 Goal status: INITIAL  4.   Patient will increase bilateral leg MMT's to 4+ to 5/5 to allow navigation of steps without gait deviation or loss of balance Baseline:  Goal status: INITIAL  PLAN:  PT FREQUENCY: 1x/week  PT DURATION: 6 weeks  PLANNED INTERVENTIONS: 97164- PT Re-evaluation, 97110-Therapeutic exercises, 97530- Therapeutic activity, 97112- Neuromuscular re-education, 97535- Self Care, 02859- Manual therapy, [id number]- Gait training, [phone]- Orthotic Fit/training, [phone]- Canalith repositioning, [id number]- Aquatic Therapy, 97760- Splinting, [id number]- Wound care (first 20 sq cm), 97598- Wound care (each additional 20 sq cm)Patient/Family education, Balance training, Stair training, Taping, Dry Needling, Joint mobilization, Joint manipulation, Spinal manipulation, Spinal mobilization, Scar mobilization, and DME instructions. SABRA  PLAN FOR NEXT SESSION: has dialysis m,w and fri; Continue manual therapy to address lumbar hypomobility. Continue to progress postural  and LE strengthening exercises.  Augustin Mclean, LPTA/CLT; CBIS [phone]  9:33 AM, 07/26/24   "

## 2024-07-26 NOTE — Progress Notes (Signed)
" ° ° °  07/26/2024   Chief Complaint  Patient presents with   Back Pain    Has started PT not better and now the right knee is painful     No diagnosis found.  What pharmacy do you use ? _____[id number]______________________  DOI/DOS/ Date:    Did you get better, worse or no change (Answer below)   Unchanged      "

## 2024-07-26 NOTE — Progress Notes (Signed)
 FOLLOW-UP OFFICE VISIT   Patient: Melissa [name]           Date of Birth: [birth year]           MRN: [id number] Visit Date: 07/26/2024 Requested by: Alston Silvio BROCKS, FNP [address] [place],  [place] [address] PCP: Alston Silvio BROCKS, FNP    Encounter Diagnoses  Name Primary?   Right knee pain, unspecified chronicity Yes   Radicular pain of right lower extremity    Other spondylosis with radiculopathy, lumbar region    Lumbar pain     Chief Complaint  Patient presents with   Back Pain    Has started PT not better and now the right knee is painful     New complaint today of anterior right knee pain.  Patient is having difficulty ambulating walking on her tiptoes  Exam is relatively benign except for she has tenderness over the patellar tendon and although she has full extension the knee only flexes to 105 degrees.  Some of this may be secondary to the morphology of the leg  DG Knee AP/LAT W/Sunrise Right Result Date: 07/26/2024 Image report on the right knee Anterior knee pain X-ray right knee 3 views X-rays show varus alignment to the right knee narrowing of the medial compartment posterior anterior and patellofemoral osteophytes Impression arthritis of the knee grade 3   The patient has significant medical issues at this time related to kidney failure may require transplant  Surgical options limited  ASSESSMENT AND PLAN Encounter Diagnoses  Name Primary?   Right knee pain, unspecified chronicity Yes   Radicular pain of right lower extremity    Other spondylosis with radiculopathy, lumbar region    Lumbar pain     Recommend cortisone injection can be repeated in 6 months  Continue therapy for spine issue  RETURN PRN   Procedure note right knee injection   verbal consent was obtained to inject right knee joint  Timeout was completed to confirm the site of injection  The medications used were depomedrol 40 mg and 1% lidocaine  3 cc Anesthesia was provided by ethyl chloride  and the skin was prepped with alcohol.  After cleaning the skin with alcohol a 20-gauge needle was used to inject the right knee joint. There were no complications. A sterile bandage was applied.

## 2024-07-31 ENCOUNTER — Ambulatory Visit (HOSPITAL_COMMUNITY)

## 2024-07-31 DIAGNOSIS — M6281 Muscle weakness (generalized): Secondary | ICD-10-CM

## 2024-07-31 DIAGNOSIS — M545 Low back pain, unspecified: Secondary | ICD-10-CM

## 2024-07-31 DIAGNOSIS — R262 Difficulty in walking, not elsewhere classified: Secondary | ICD-10-CM

## 2024-08-07 ENCOUNTER — Ambulatory Visit (HOSPITAL_COMMUNITY)

## 2024-08-08 ENCOUNTER — Ambulatory Visit (HOSPITAL_BASED_OUTPATIENT_CLINIC_OR_DEPARTMENT_OTHER): Admitting: Internal Medicine

## 2024-08-14 ENCOUNTER — Ambulatory Visit (HOSPITAL_COMMUNITY)

## 2025-02-01 ENCOUNTER — Ambulatory Visit: Admitting: Nurse Practitioner
